# Patient Record
Sex: Female | Born: 1945
Health system: Southern US, Community
[De-identification: ages and names within clinical notes are randomized; demographics above are authoritative.]

## PROBLEM LIST (undated history)

## (undated) DIAGNOSIS — J45909 Unspecified asthma, uncomplicated: Secondary | ICD-10-CM

## (undated) DIAGNOSIS — M1990 Unspecified osteoarthritis, unspecified site: Secondary | ICD-10-CM

## (undated) DIAGNOSIS — L309 Dermatitis, unspecified: Secondary | ICD-10-CM

## (undated) DIAGNOSIS — I10 Essential (primary) hypertension: Secondary | ICD-10-CM

## (undated) DIAGNOSIS — E7800 Pure hypercholesterolemia, unspecified: Secondary | ICD-10-CM

## (undated) DIAGNOSIS — K219 Gastro-esophageal reflux disease without esophagitis: Secondary | ICD-10-CM

## (undated) HISTORY — PX: CARPAL TUNNEL RELEASE: SHX101

## (undated) HISTORY — PX: BACK SURGERY: SHX140

## (undated) HISTORY — PX: HIP SURGERY: SHX245

## (undated) HISTORY — PX: ANAL FISSURECTOMY: SUR608

## (undated) HISTORY — PX: VESICOVAGINAL FISTULA CLOSURE W/ TAH: SUR271

## (undated) HISTORY — PX: ABDOMINAL HYSTERECTOMY: SHX81

## (undated) HISTORY — DX: Gastro-esophageal reflux disease without esophagitis: K21.9

## (undated) HISTORY — DX: Dermatitis, unspecified: L30.9

## (undated) HISTORY — DX: Essential (primary) hypertension: I10

## (undated) HISTORY — DX: Pure hypercholesterolemia, unspecified: E78.00

---

## 2001-12-07 ENCOUNTER — Encounter: Payer: Self-pay | Admitting: Family Medicine

## 2001-12-07 ENCOUNTER — Ambulatory Visit (HOSPITAL_COMMUNITY): Admission: RE | Admit: 2001-12-07 | Discharge: 2001-12-07 | Payer: Self-pay | Admitting: Family Medicine

## 2002-01-17 ENCOUNTER — Ambulatory Visit (HOSPITAL_COMMUNITY): Admission: RE | Admit: 2002-01-17 | Discharge: 2002-01-17 | Payer: Self-pay | Admitting: General Surgery

## 2002-05-31 ENCOUNTER — Ambulatory Visit (HOSPITAL_COMMUNITY): Admission: RE | Admit: 2002-05-31 | Discharge: 2002-05-31 | Payer: Self-pay | Admitting: Family Medicine

## 2002-05-31 ENCOUNTER — Encounter: Payer: Self-pay | Admitting: Family Medicine

## 2002-11-05 ENCOUNTER — Ambulatory Visit (HOSPITAL_COMMUNITY): Admission: RE | Admit: 2002-11-05 | Discharge: 2002-11-05 | Payer: Self-pay | Admitting: Orthopaedic Surgery

## 2002-11-05 ENCOUNTER — Encounter: Payer: Self-pay | Admitting: Orthopaedic Surgery

## 2003-01-02 ENCOUNTER — Encounter: Payer: Self-pay | Admitting: Neurosurgery

## 2003-01-04 ENCOUNTER — Encounter: Payer: Self-pay | Admitting: Neurosurgery

## 2003-01-04 ENCOUNTER — Inpatient Hospital Stay (HOSPITAL_COMMUNITY): Admission: RE | Admit: 2003-01-04 | Discharge: 2003-01-09 | Payer: Self-pay | Admitting: Neurosurgery

## 2003-05-29 ENCOUNTER — Ambulatory Visit (HOSPITAL_COMMUNITY): Admission: RE | Admit: 2003-05-29 | Discharge: 2003-05-29 | Payer: Self-pay | Admitting: Family Medicine

## 2003-07-01 ENCOUNTER — Encounter: Payer: Self-pay | Admitting: Orthopedic Surgery

## 2003-08-13 ENCOUNTER — Ambulatory Visit (HOSPITAL_COMMUNITY): Admission: RE | Admit: 2003-08-13 | Discharge: 2003-08-13 | Payer: Self-pay | Admitting: Family Medicine

## 2003-11-08 ENCOUNTER — Encounter: Payer: Self-pay | Admitting: Orthopedic Surgery

## 2003-11-08 ENCOUNTER — Inpatient Hospital Stay (HOSPITAL_COMMUNITY): Admission: RE | Admit: 2003-11-08 | Discharge: 2003-11-12 | Payer: Self-pay | Admitting: Orthopedic Surgery

## 2003-11-12 ENCOUNTER — Inpatient Hospital Stay
Admission: RE | Admit: 2003-11-12 | Discharge: 2003-11-20 | Payer: Self-pay | Admitting: Physical Medicine & Rehabilitation

## 2003-11-13 ENCOUNTER — Encounter (HOSPITAL_COMMUNITY)
Admission: RE | Admit: 2003-11-13 | Discharge: 2003-11-20 | Payer: Self-pay | Admitting: Physical Medicine & Rehabilitation

## 2004-02-17 ENCOUNTER — Ambulatory Visit: Payer: Self-pay | Admitting: Orthopedic Surgery

## 2004-04-15 ENCOUNTER — Ambulatory Visit: Payer: Self-pay | Admitting: Orthopedic Surgery

## 2004-05-13 ENCOUNTER — Ambulatory Visit: Payer: Self-pay | Admitting: Orthopedic Surgery

## 2004-06-10 ENCOUNTER — Ambulatory Visit: Payer: Self-pay | Admitting: Orthopedic Surgery

## 2004-11-23 ENCOUNTER — Ambulatory Visit: Payer: Self-pay | Admitting: Orthopedic Surgery

## 2005-07-15 ENCOUNTER — Ambulatory Visit (HOSPITAL_COMMUNITY): Admission: RE | Admit: 2005-07-15 | Discharge: 2005-07-15 | Payer: Self-pay | Admitting: Family Medicine

## 2005-11-29 ENCOUNTER — Ambulatory Visit: Payer: Self-pay | Admitting: Orthopedic Surgery

## 2006-08-09 ENCOUNTER — Ambulatory Visit (HOSPITAL_COMMUNITY): Admission: RE | Admit: 2006-08-09 | Discharge: 2006-08-09 | Payer: Self-pay | Admitting: Family Medicine

## 2006-10-05 ENCOUNTER — Ambulatory Visit (HOSPITAL_COMMUNITY): Admission: RE | Admit: 2006-10-05 | Discharge: 2006-10-05 | Payer: Self-pay | Admitting: Family Medicine

## 2006-10-19 ENCOUNTER — Ambulatory Visit (HOSPITAL_COMMUNITY): Admission: RE | Admit: 2006-10-19 | Discharge: 2006-10-19 | Payer: Self-pay | Admitting: Family Medicine

## 2007-04-18 ENCOUNTER — Ambulatory Visit: Payer: Self-pay | Admitting: Internal Medicine

## 2007-04-18 DIAGNOSIS — R0609 Other forms of dyspnea: Secondary | ICD-10-CM | POA: Insufficient documentation

## 2007-04-18 DIAGNOSIS — J984 Other disorders of lung: Secondary | ICD-10-CM | POA: Insufficient documentation

## 2007-04-18 DIAGNOSIS — J309 Allergic rhinitis, unspecified: Secondary | ICD-10-CM | POA: Insufficient documentation

## 2007-04-18 DIAGNOSIS — F17200 Nicotine dependence, unspecified, uncomplicated: Secondary | ICD-10-CM | POA: Insufficient documentation

## 2007-04-18 DIAGNOSIS — E65 Localized adiposity: Secondary | ICD-10-CM

## 2007-04-18 DIAGNOSIS — I10 Essential (primary) hypertension: Secondary | ICD-10-CM | POA: Insufficient documentation

## 2007-04-18 DIAGNOSIS — E785 Hyperlipidemia, unspecified: Secondary | ICD-10-CM | POA: Insufficient documentation

## 2007-04-18 DIAGNOSIS — R0989 Other specified symptoms and signs involving the circulatory and respiratory systems: Secondary | ICD-10-CM

## 2007-06-26 ENCOUNTER — Emergency Department (HOSPITAL_COMMUNITY): Admission: EM | Admit: 2007-06-26 | Discharge: 2007-06-26 | Payer: Self-pay | Admitting: Emergency Medicine

## 2007-12-06 ENCOUNTER — Ambulatory Visit: Payer: Self-pay | Admitting: Orthopedic Surgery

## 2007-12-06 DIAGNOSIS — M169 Osteoarthritis of hip, unspecified: Secondary | ICD-10-CM

## 2007-12-06 DIAGNOSIS — M1610 Unilateral primary osteoarthritis, unspecified hip: Secondary | ICD-10-CM | POA: Insufficient documentation

## 2008-02-28 ENCOUNTER — Ambulatory Visit (HOSPITAL_COMMUNITY): Admission: RE | Admit: 2008-02-28 | Discharge: 2008-02-28 | Payer: Self-pay | Admitting: Family Medicine

## 2009-02-12 ENCOUNTER — Ambulatory Visit: Payer: Self-pay | Admitting: Internal Medicine

## 2009-02-12 DIAGNOSIS — R05 Cough: Secondary | ICD-10-CM

## 2009-05-20 ENCOUNTER — Ambulatory Visit (HOSPITAL_COMMUNITY): Admission: RE | Admit: 2009-05-20 | Discharge: 2009-05-20 | Payer: Self-pay | Admitting: Family Medicine

## 2009-06-17 ENCOUNTER — Ambulatory Visit: Payer: Self-pay | Admitting: Orthopedic Surgery

## 2009-06-17 DIAGNOSIS — M25569 Pain in unspecified knee: Secondary | ICD-10-CM | POA: Insufficient documentation

## 2010-05-03 ENCOUNTER — Encounter: Payer: Self-pay | Admitting: Family Medicine

## 2010-05-12 NOTE — Medication Information (Signed)
Summary: Tax adviser   Imported By: Elvera Maria 06/20/2009 09:16:56  _____________________________________________________________________  External Attachment:    Type:   Image     Comment:   history

## 2010-05-12 NOTE — Assessment & Plan Note (Signed)
Summary: LEFT KNEE PAIN NEEDS XR/[facility]   Vital Signs:  Patient profile:   65 year old female Height:      63 inches Weight:      238 pounds Pulse rate:   76 / minute Resp:     16 per minute  Vitals Entered By: Fuller Canada MD (June 17, 2009 2:25 PM)  Visit Type:  new problem Referring Provider:  Dr. Mirna Mires. Primary Provider:  Dr. Loleta Chance  CC:  left knee pain.  History of Present Illness: 65 year old female who is now 6 years status post RIGHT total hip arthroplasty with the Stryker ceramic head Trident cup Accolade stem presents now with mild dull intermittent aching pain in the LEFT knee with swelling and.  It is worse with standing or walking or if she stays off the knee.  Pain comes and goes.  She thinks it started about 6 months ago when she got in the car and the knee twisted she felt a twinge of pain in the back of the knee since that time has noted a pocket of fluid lateral to the knee pain is also relieved by Aleve and when the swelling is present she does get some stiffness   To have xrays today.  Meds: ASA, Benicar, Metoprolol,Aleve, Zocor.      Allergies: 1)  ! 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  Review of Systems Respiratory:  Complains of wheezing, couch, and snoring; denies short of breath, tightness, pain on inspiration, and snoring . Musculoskeletal:  Complains of joint pain, instability, and stiffness; denies swelling, redness, heat, and muscle pain.  The review of systems is negative for Constitutional, Cardiovascular, Gastrointestinal, Genitourinary, Neurologic, Endocrine, Psychiatric, Skin, HEENT, Immunology, and Hemoatologic.  Physical Exam  Additional Exam:  GEN: well developed, well nourished, normal grooming and hygiene, no deformity and endomorphic body habitus.   CDV: pulses are normal, no edema, no erythema. no tenderness  Lymph: normal lymph nodes   Skin: no rashes, skin lesions or open sores   NEURO: normal coordination, reflexes, sensation.   Psyche: awake, alert and oriented. Mood normal   Gait: normal  Grossly the upper extremities are normally aligned with no joint laxity subluxation atrophy tremor or contracture  The LEFT knee has no joint effusion today there is some mild tenderness along the lateral femoral condyle and lateral soft tissues.  The range of motion is normal, her strength is grade 5, there is no joint laxity  Her RIGHT lower extremity is normally aligned, her range of motion is full, strength is normal and the joint is stable     Impression & Recommendations:  Problem # 1:  KNEE PAIN (ICD-719.46) Assessment New  3 views of the LEFT knee were  ordered  The x-rays are essentially normal except for some patellofemoral spurring  Impression mild patellofemoral arthritis.  I have advised her to continue Aleve and ice as needed rest as needed follow up if symptoms become more severe but right now it seems like he just has some knee pain nonspecific  Orders: Est. Patient Level  IV (04540) Knee x-ray,  3 views (98119)  Patient Instructions: 1)  Please schedule a follow-up appointment as needed.

## 2010-08-28 NOTE — Op Note (Signed)
NAME:  Jennifer Bennett, Jennifer Bennett                ACCOUNT NO.:  [id number]   MEDICAL RECORD NO.:  [id number]                   PATIENT TYPE:  INP   LOCATION:  A338                                 FACILITY:  [facility]   PHYSICIAN:  Vickki Hearing, M.D.           DATE OF BIRTH:  November 02, 1945   DATE OF PROCEDURE:  11/08/2003  DATE OF DISCHARGE:                                 OPERATIVE REPORT   PREOPERATIVE DIAGNOSIS:  Osteoarthritis, right hip.   POSTOPERATIVE DIAGNOSIS:  Osteoarthritis, right hip.   OPERATION/PROCEDURE:  Right total hip arthroplasty.   HISTORY:  The patient is a 65 year old female, status post lumbar fusion,  who continued to complain of right leg pain with groin pain and pain in the  right thigh radiating to the knee after nonoperative treatment failed to  alleviate her symptoms.  Additional radiographs were obtained which showed a  right hip arthritis and she presented for surgery.   SURGEON:  Vickki Hearing, M.D.   ANESTHESIA:  Spinal.   ESTIMATED BLOOD LOSS:  750 mL.   IMPLANTS USED:  Size 2 Accolade stem, Stryker system.  32 ceramic head, 0  neck, 52E Trident PSL cup, 0-degree 32 mm polyethylene liner with two  cancellous screws.   DESCRIPTION OF PROCEDURE:  Mrs. Watkins-Schnabel was identified in the  preoperative holding area.  Her chart was reviewed.  A mark was placed over  her right hip and I signed my initials over the mark.  She was given  preoperative Ancef and taken to the operating room room where spinal was  then placed in the lateral decubitus position, right side up.   An incision was made over the greater trochanter, extended proximally and  distally.  The subcutaneous tissue, which was thick, was divided and  cauterized.  The fascial layer was split in line with the skin incision.  This exposed the abductors and the abductors were removed from the greater  trochanter subperiosteal fascia.  This was extended into the vastus  lateralis  musculature and peeled anteriorly as one continuous flap.  The  capsule was excised and the hip was dislocated anteriorly.  A long femoral  neck cut was made in preparation to use the ceramic head if possible.  The  inner portion of the greater trochanter was removed with the rongeur and a  __________ was passed into the femoral canal followed by broaches up to a  size 2 which had an excellent fit.  The leg was taken out of the sterile bag  and extended and acetabular exposure was obtained.  This was extremely  difficult.  The head had migrated proximally creating soft tissue and the  inferior portion of the acetabulum, which had to be removed, obscured the  acetabulum, and a long dissection was necessary to remove the soft tissue  and expose the anterior and posterior columns.   Once this was accomplished, a 44 mm reamer was used to bottom out the  acetabulum and serial reamings  in a closed anteverted position were done up  to a size 52 at which time a 52 trial was placed and seated very well.  The  floor of the acetabulum needed to be bone grafted and this was done with  femoral head and reamings.   The cup was placed and had a good press-fit.  Two additional screws were  added for additional fixation.  The trial liner was placed and a neck length  was chosen and a 32 mm head was chosen and the hip was taken through a range  of motion after reduction.  We achieved 5 degrees of hyperextension and 50  degrees of external rotation but no dislocation or impingement.  We also  achieved 100 degrees of flexion, 45 degrees of internal rotation with no  impingement and we also adducted the hip 10 degrees at the 90-degree flex  position and internally rotated 50 degrees with no dislocation.   This was deemed satisfactory stability.  The hip was dislocated.  The trial  and implants were removed.  The liner was placed followed by the true  implantation of the femoral stem within the ceramic head.   The head was  reduced gently. The wound was irrigated.  The hip was taken through a range  of motion and found to be stable.  We put in a Hemovac drain and then closed  the abductor vastus lateralis mass back to its anatomic position using #5  Ticron suture.  We then closed the fascia with #1 running Bralon using three  sutures. We closed the subcutaneous tissue with 0 and 2-0 Vicryl.  We closed  the skin.  We inserted a pain pump catheter and dressed the wound sterilely,  and then took the patient back to her regular bed with an abduction pillow,  and then to the recovery room in stable condition.   Postoperative x-ray was taken showing that the cup position was good.  Stem  position was also good and the hip was reduced.   Postoperative plan is normal total hip protocol.      ___________________________________________                                            Vickki Hearing, M.D.   SEH/MEDQ  D:  11/08/2003  T:  11/09/2003  Job:  [id number]

## 2010-08-28 NOTE — Consult Note (Signed)
NAME:  Jennifer Bennett, Jennifer Bennett                ACCOUNT NO.:  [id number]   MEDICAL RECORD NO.:  [id number]                   PATIENT TYPE:  INP   LOCATION:  A338                                 FACILITY:  [facility]   PHYSICIAN:  Annia Friendly. Loleta Chance, M.D.                DATE OF BIRTH:  10-16-1945   DATE OF CONSULTATION:  DATE OF DISCHARGE:                                   CONSULTATION   HISTORY:  The patient was admitted by orthopedics, Dr. Fuller Canada, for  elective total right hip replacement on November 08, 2003.  Her history is  positive for increased right groin and right hip pain over 1 year.  The  patient has found it difficult to perform function at work due to pain.  The  patient is the head teller department at a bank.  Her work environment  required her to be on her feet constantly.  The patient is using a cane and  taking analgesics for relief at the time of admission.  The patient feels  that her quality of life has deteriorated a lot.  An x-ray of her hip shows  significant degenerative joint disease and proximal migration of the femoral  head.   PAST MEDICAL HISTORY:  1. Hypertension.  2. Hyperlipidemia.  3. Surgical menopause.  4. Negative for diabetes, tuberculosis, cancer, sickle cell, asthma, and     seizure disorder.   HABITS:  Positive for cigarette smoking greater than 18 months.  Positive  for social use of ethanol.  The patient denied the use of street drugs.   ALLERGIES:  1. ACE INHIBITORS.  2. OXYCODONE.   PRESCRIBED MEDICATIONS:           1. ________ 250/25 po q day.           2. Lopid 600 mg po q day after supper.           3. Metoprolol 25 mg 1 tab twice a day.           4. Vicodin ES  _____po q 4 - 6 hours as needed for pain.           5.  PAST HOSPITALIZATIONS:  1. Anal dilatation with anal fissurectomy and internal sphincterotomy in     January 1993 at [facility].  2. Total abdominal hysterectomy with bilateral salpingo-oophorectomy and  appendectomy in December 1993.  3. Return of right lower abdominal pain secondary to degenerating fibroid in     December 1993.  4. Lower back surgery secondary to disk disease in September 2004 at [facility].   FAMILY HISTORY:  Mother is living at age 52 with a history of hypertension  and osteoarthritis; father deceased at age 81 secondary to complications of  pancreatic cancer.  Three brothers are living at the ages of 4 with a  history of hypertension, age 59 with a history of hypertension, and age 42  with good  health.  Two sisters are living at the ages of 85 with a history  of hypertension and age 34 in good health.   REVIEW OF SYSTEMS:  Positive for episodic constipation, multiple joint  stiffness and pain (knees and shoulder intermittently).  Also positive for  periods of melancholy, crying spells, and a feeling of despair because of  her multiple medical problems.  Negative for suicidal ideation, epistaxis,  chronic cough, hemoptysis, wheezing, shortness of breath, chest pain,  syncope, dizziness, nausea and vomiting, dysuria, diarrhea, edema of the  legs, weight loss, lumps in breasts, discharge from nipples, dysphasia, etc.   PHYSICAL EXAMINATION:  VITAL SIGNS:  Temperature 99.5, pulse 84,  respirations 18, blood pressure 132/75.  GENERAL:  Revealed a middle-aged overweight, slightly short, alert, black  female in apparent respiratory distress.  HEENT:  Normocephalic.  Ears normal auricle and external canal patent.  Eyes, lids negative ptosis.  Sclerae white.  Pupils round and equally  reactive to light.  Extraocular movements intact.  Negative discharge from  mouth.  Positive for missing teeth, remaining dentition good.  No bleeding  gums.  Posterior pharynx benign.  NECK:  Negative for lymphadenopathy or thyromegaly.  LUNGS:  Clear.  HEART:  Audible S1 S2 rub or murmur.  Regular rate and rhythm.  BREASTS:  Large.  No skin changes.  No nodule on palpation.   Nipple erect  but without discharge.  ABDOMEN:  Obese.  Hypoactive bowel sounds.  Positive for healed old surgical  scar.  Soft, nontender in all 4 quadrants.  No palpable masses, no  organomegaly.  GENITALIA:  External genitalia normal female.  RECTAL:  Deferred.  EXTREMITIES:  Right hip surgical dressing intact.  No discoloration.  Knees  positive for stiffness on flexion and extension.  No swelling of the knees.  No tibial edema.  Palpable dorsalis pedis bilaterally.  NEUROLOGIC:  Alert and oriented to person, place, and time.  Cranial nerves  II-XII appeared intact.   LABORATORY DATA:  On November 06, 2003 white count 4.6, hemoglobin 13.6,  hematocrit 39.5, platelets 310,000.  Prothrombin time 12.5, INR 0.9, partial  thromboplastin time 32.  Sodium 139, potassium 3.9, chloride 103, CO2 of 29,  glucose 93, BUN 11, creatinine 0.7, calcium 9.5.  Labs on November 09, 2003  hemoglobin 9.2, hematocrit 26.9, platelets 269,000.  Sodium 132, potassium  4.5, chloride 98, CO2 is 27, glucose 124, BUN 16, creatinine 1.0, calcium  8.6.   PLAN:  CBC.  Decrease metoprolol to 12.5 mg p.o. b.i.d.  Change  hydrochlorothiazide to 12.5 mg p.o. everyday.  Watch blood pressure.  Repeat  CBC, SMA-7.  Do a urinalysis.  Continue postoperative plan under orthopedic  guidance.  Aldoril 25 one tablet p.o. everyday.  TED stockings.  Lopid 600  mg p.o. daily.  Analgesic for pain IV ringers lactate at 100 cc/hour.  Adjust under the supervision of the orthopedist.  Diet will be 4 g sodium,  low cholesterol   PRIMARY DIAGNOSIS:  Status post _____total right hip arthrosplasty.   SECONDARY DIAGNOSES:                 1. Hypertension                 2. Osteoarthritis                 3. Hyperlipidemia                 4. Post-op anemia.  ___________________________________________                                            Annia Friendly. Loleta Chance, M.D.  Levonne Hubert  D:  11/11/2003  T:  11/11/2003  Job:  [id number]

## 2010-08-28 NOTE — Group Therapy Note (Signed)
NAME:  Jennifer Bennett, Jennifer Bennett NO.:  [id number]   MEDICAL RECORD NO.:  [id number]                  PATIENT TYPE:   LOCATION:                                       FACILITY:   PHYSICIAN:  Vickki Hearing, M.D.           DATE OF BIRTH:  1946/03/18   DATE OF PROCEDURE:  DATE OF DISCHARGE:                                   PROGRESS NOTE   Postoperative day one right total hip replacement with bone graft.  The  patient has a low grade temperature.  Has a hemoglobin of 9, started with  11.1.  Pain control is an issue today.  She is status post lumbar fusion and  was maintained on Vicodin ES or Lorcet Plus and I believe this to be the  reason for the pain management problem at this time.  This should be easily  managed with a PCA pump, however.  She should be out of bed to chair today.  Will start her on iron.  I will change her weightbearing status to partial.  Secondary review of the postoperative x-ray shows a hint of a hairline  fracture of the acetabulum.  The cup is stable as it has been stabilized  with screws.  There is bone graft in the defect in the floor of the  acetabulum and partial weightbearing status will serve her better.      ___________________________________________                                            Vickki Hearing, M.D.   SEH/MEDQ  D:  11/09/2003  T:  11/09/2003  Job:  [id number]

## 2010-08-28 NOTE — H&P (Signed)
   NAME:  Jennifer Bennett, Jennifer Bennett                        ACCOUNT NO.:  [id number]   MEDICAL RECORD NO.:  [id number]                   PATIENT TYPE:  AMB   LOCATION:  DAY                                  FACILITY:  [facility]   PHYSICIAN:  Jerolyn Shin C. Katrinka Blazing, M.D.                DATE OF BIRTH:  [birth year]   DATE OF ADMISSION:  DATE OF DISCHARGE:                                HISTORY & PHYSICAL   HISTORY OF PRESENT ILLNESS:  A 65 year old female referred for screening  colonoscopy.  She has no abdominal complaints, no history of rectal  bleeding, no evidence of anemia.  There is no family history of colon cancer  or polypoid disease.   PAST HISTORY:  She has hypertension, osteoarthritis and obesity.   PAST SURGICAL HISTORY:  Hysterectomy, appendectomy, anal fissurectomy and  internal sphincterotomy.   SOCIAL HISTORY:  She is a widow.  She smokes one pack of cigarettes per day,  admits to occasional alcohol.   MEDICATIONS:  1. Metoprolol 50 mg every day.  2. K-Dur 20 mEq every day.  3. Lopid 600 mg every day.  4. Evista 60 mg every day.  5. Aspirin one every day.  6. Calcium 1500 mg every day.  7. Aldoril 25 mg one every day.   ALLERGIES:  She has no known drug allergies.   PHYSICAL EXAMINATION:  VITAL SIGNS:  Blood pressure 140/82, pulse 68 and  respirations 18.  Weight 213 pounds.  HEENT:  Unremarkable.  NECK:  Neck is supple.  No JVD or bruits.  CHEST:  Chest clear to auscultation.  No rales, rubs, rhonchi or wheezes.  HEART:  Regular rate and rhythm without murmur, gallop or rub.  ABDOMEN:  Abdomen soft, nontender.  _________.  EXTREMITIES:  No cyanosis, clubbing or edema.  NEUROLOGIC:  No focal motor, sensory or cerebellar deficit.   IMPRESSION:  1. Need for screening colonoscopy.  2.     Hypertension.  3. Osteoarthritis.   PLAN:  Screening colonoscopy.                                               Dirk Dress. Katrinka Blazing, M.D.    LCS/MEDQ  D:  01/16/2002  T:  01/17/2002  Job:   [id number]

## 2010-08-28 NOTE — Discharge Summary (Signed)
NAME:  Jennifer Bennett, Jennifer Bennett                ACCOUNT NO.:  [id number]   MEDICAL RECORD NO.:  [id number]                   PATIENT TYPE:  ORB   LOCATION:  4526                                 FACILITY:  [facility]   PHYSICIAN:  Ranelle Oyster, M.D.             DATE OF BIRTH:  December 26, 1945   DATE OF ADMISSION:  11/12/2003  DATE OF DISCHARGE:  11/20/2003                                 DISCHARGE SUMMARY   DISCHARGE DIAGNOSES:  1. Right total hip replacement.  2. History of hypertension.  3. Anemia.  4. History of hyperlipidemia.   HISTORY OF PRESENT ILLNESS:  The patient is 65 year old, African-American  female with past medical history of lumbar spine in 2004, recent carpal  tunnel release and right hip pain which failed conservative care and elected  to undergo a right total hip replacement on November 08, 2003, by Dr. Vickki Hearing.  The patient is presently on no DVT prophylaxis postoperatively.  Hospital course was significant for anemia, status post transfusion and  hypotension.  PT report at this time indicates the patient is ambulating 15  feet with minimal to moderate assist for transfers.  The patient is partial  weightbearing at 50%.  The patient was transferred to [facility] Subacute  Department on November 12, 2003.   REVIEW OF SYMPTOMS:  Significant for joint swelling with wound care,  weakness and numbness.   PAST MEDICAL HISTORY:  1. Back pain with stenosis.  2. Hypertension.  3. Carpal tunnel syndrome.  4. Hyperlipidemia.   PAST SURGICAL HISTORY:  1. Hysterectomy.  2. Appendectomy.  3. Lumbar spine surgery in 2004.  4. Carpal tunnel release.   FAMILY HISTORY:  Noncontributory.   SOCIAL HISTORY:  The patient is a widow and lives in a one-level home with  four steps to entry.  She has no children.  She lives in [place].  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  She quit tobacco in February 2004.  Occasional alcohol use.  One step-son.   MEDICATIONS PRIOR TO ADMISSION:  Metoprolol, Vicodin, Lopid.   ALLERGIES:  ACE INHIBITORS.   HOSPITAL COURSE:  Ms. Mossbarger was admitted to [facility] Subacute  Department on August 2, where she received many hours of therapy.  Overall,  the patient progressed very well once the pain was under reasonable control.  She received Vicodin as needed.  The patient was placed on Coumadin for DVT  prophylaxis.  She was started on Lovenox 30 mg q.12h. on November 12, 2003,  until Coumadin was therapeutic.  Due to no DVT prophylaxis prior to  admission to subacute, we did perform Dopplers on November 12, 2003, on the  patient which demonstrated no evidence of DVT, superficial thrombus or  Baker's cyst.  The patient with no complications noted.  Overall, she was  discharged at modified independent level able to maintain her weightbearing  status and ambulating for greater than 100 feet.  She did tolerate  therapies  very well.  Hospital course while in subacute was significant for anemia,  muscle spasms and insomnia.  The patient was placed on Ambien p.o. q.h.s as  needed for sleep.  She was continued on Trinsicon one tablet p.o. b.i.d. for  anemia.  She was scheduled on Skelaxin 800 mg.  The patient remained on  Aldomet as well as HCTZ and Lopressor for hypertension.  No adjustments in  these medications was needed.  She continued to take Lopid 600 mg a day for  history of hyperlipidemia.  The patient did receive potassium supplement  while on subacute.  This was discontinued at the time of discharge.  Latest  potassium was 4.2 and the patient was not on potassium supplement prior to  admission.  There were no other major issues that occurred while the patient  was in rehabilitation.  She did have a slight elevated fever on November 13, 2003.  She was started on antibiotics empirically.  Her fever workup was  negative and antibiotics were discontinued.   PT report at the time of discharge indicated  the patient was unable to  ambulate 25 feet with standard walker modified independently.  Transfers sit  to stand modified independently.  Bed mobility modified independently.  ADLs  performed were modified independently at the time of discharge.   At the time of discharge, all vitals are stable.  There was slight serous  drainage from the posterior of the incision.  Staples still remained.  She  follow up with Dr. Romeo Apple next week for staples to be removed.  The rest  of the physical exam is unremarkable.  The patient was discharged home with  her family.   LABORATORY DATA AND X-RAY FINDINGS:  Latest labs revealed PT/INR 2.3 on  August 10.  Latest hemoglobin 10.4, hematocrit 31.0, platelet count 440,  white blood count 11.1.  Two Hemoccults were performed and were negative.  Latest sodium 135, potassium 4.2, chloride 101, CO2 26, glucose 103, BUN 11,  creatinine 0.7.  AST 28, ALT 20, Alk phos 62.  Urine culture from November 13, 2003, with 75,000 colonies with no uropathogen isolated.   DISCHARGE MEDICATIONS:  1. Coumadin 5 mg daily until December 09, 2003, take in p.m.  2. Trinsicon one tablet twice daily.  3. Skelaxin 800 mg one tablet three times daily if needed.  4. Resume Aldomet and HCTZ.  5. Resume Lopid 600 mg twice daily.  6. Metoprolol 25 mg once daily.  7. Vicodin one to two tablets every four to six hours as needed.  8. No aspirin, Aleve or ibuprofen while on Coumadin.  9. Vicodin for pain management.   ACTIVITY:  No driving.  Use walker.  She is to maintain partial  weightbearing at 50% and observe hip precautions.   WOUND CARE:  Staples to be removed at end of this week or early next week  from Dr. Mort Sawyers office.   SPECIAL INSTRUCTIONS:  [facility] for PT/OT and R.N. to monitor  Coumadin.  First draw will be Monday, November 25, 2003.   FOLLOW UP:  Follow up with Dr. Ranelle Oyster as needed.  Follow up with Vickki Hearing next week for  appointment.  Follow up with Dr. Mirna Mires in four to six weeks to check hemoglobin.      Loyce Brown, P.A.  Ranelle Oyster, M.D.    LB/MEDQ  D:  11/20/2003  T:  11/21/2003  Job:  [id number]   cc:   Vickki Hearing, M.D.  Fax: [phone]   Annia Friendly. Loleta Chance, M.D.  [address]  [place]  [place] [address]  Fax: [phone]

## 2010-08-28 NOTE — Op Note (Signed)
NAME:  Jennifer Bennett, Jennifer Bennett                ACCOUNT NO.:  [id number]   MEDICAL RECORD NO.:  [id number]                   PATIENT TYPE:  INP   LOCATION:  5034                                 FACILITY:  [facility]   PHYSICIAN:  Coletta Memos, M.D.                  DATE OF BIRTH:  07-Dec-1945   DATE OF PROCEDURE:  01/04/2003  DATE OF DISCHARGE:                                 OPERATIVE REPORT   PREOPERATIVE DIAGNOSES:  1. Spondylolisthesis, L4-5, acquired.  2. Degenerative disk disease, L4-5.  3. Displaced disk, L4-5 right.  4. Lumbar radiculopathy.   POSTOPERATIVE DIAGNOSES:  1. Spondylolisthesis, L4-5, acquired.  2. Degenerative disk disease, L4-5.  3. Displaced disk, L4-5 right.  4. Lumbar radiculopathy.   PROCEDURES:  1. Posterolateral arthrodesis, L4-5, with pedicle screw fixation, four 40 x     6.5 mm Medtronic screws.  2. Posterior lumbar interbody fusion, L4-5, with Peek 11 mm cage.  3. Gill procedure, L4.  4. Morcellized allograft and autograft for L4-5 interbody arthrodesis.   COMPLICATIONS:  None.   SURGEON:  Coletta Memos, M.D.   ASSISTANT:  Stefani Dama, M.D.   INDICATIONS:  Jennifer Bennett has a spondylolisthesis and a herniated  disk on the right side along with significant facet arthropathy at L4-5.  As  a result and her right lower extremity pain, I recommended and she agreed to  undergo a lumbar fusion at L4-5 via posterior lumbar interbody technique and  posterolateral technique.   OPERATIVE NOTE:  Jennifer Bennett was brought to the operating room.  The patient was intubated and rolled prone onto rolls.  All pressure points  were properly padded.  She had extremely large breasts, and we did our  best  to make sure that there was no direct pressure on the breasts and nipples.   Her back was prepped and she was draped in a sterile fashion.  A skin  incision was made with a #10 blade, and I took this down to the  thoracolumbar fascia and exposed  the lamina there.  It turned out that that  was actually the L3 lamina, so I then extended my incision both rostrally  and caudally until I exposed the L3, L4, L5 laminae bilaterally.  I took  another x-ray to confirm that I was actually at L4-5, and I was.  I then  proceeded with a semihemilaminectomy on the right side using a high-speed  air drill and Kerrison punches at L4-5.  I was able to then perform the Gill  procedure of L4 without great difficulty.  I retracted the nerve root  medially on the right side and then proceeded with diskectomy.  I removed a  large amount of disk material using a combination of curettes, pituitary  rongeurs, and Kerrison punches.  After doing that and satisfying myself that  I had enough room to place an 11 mm T-LIF Peek cage, I then turned my  attention to placing  pedicle screws.   For the posterolateral arthrodesis and pedicle screw fixation, I then with  fluoroscopic guidance placed four screws with Dr. Verlee Rossetti assistance, two  at L4, two at L5.  First the transverse process was identified at each step,  tap hole was made, and then using a pedicle probe the pedicle was then  localized.  I then was able to tap and then place four screws, four 6.5 x 40  mm screws.  When I was done I placed the rod on the left side and distracted  the disk space and then placed an 11 mm cage in a transverse position on the  right side without difficulty.  I then completed the Gill procedure on the  right side with Kerrison punch and a drill.  I then packed bone into the  interspace at L4-5 with Dr. Verlee Rossetti assistance.  We then placed the rods  and screw caps on each one.  I then tightened the screws into position.  I  then irrigated the wound.  I then closed the wound in a layered fashion  using Vicryl sutures.  The thoracolumbar fascia, subcutaneous tissue, and  subcuticular layers were all reapproximated with Vicryl sutures.  Dermabond  was used for a sterile  dressing.  The patient was extubated and was moving  all extremities.                                               Coletta Memos, M.D.    KC/MEDQ  D:  01/04/2003  T:  01/07/2003  Job:  [id number]

## 2010-08-28 NOTE — Discharge Summary (Signed)
   NAME:  Jennifer Bennett, Jennifer Bennett                ACCOUNT NO.:  [id number]   MEDICAL RECORD NO.:  [id number]                   PATIENT TYPE:  INP   LOCATION:  5034                                 FACILITY:  [facility]   PHYSICIAN:  Coletta Memos, M.D.                  DATE OF BIRTH:  09/21/1945   DATE OF ADMISSION:  01/04/2003  DATE OF DISCHARGE:  01/09/2003                                 DISCHARGE SUMMARY   ADMISSION DIAGNOSIS:  L4-5 spondylolisthesis, L4-5 spondylosis, lumbar  radiculopathy.   DISCHARGE DIAGNOSIS:  L4-5 spondylolisthesis, L4-5 spondylosis, lumbar  radiculopathy.   PROCEDURES:  L4-5 posterior lumbar interbody fusion, L4-5 posterolateral  arthrodesis with pedical screw fixation.   INDICATIONS:  Ms. Dickard was admitted to the hospital for pain in  the lower back secondary to spondylolisthesis at L4-5 caused by degenerative  disk and what was a large herniated disk on the right side and facet  arthropathy.  She had right lower extremity pain and I therefore recommended  and she agreed to undergo lumbar fusion at L4-5.  Ms. Vest was  brought to the operating room and underwent an uncomplicated procedure.   HOSPITAL COURSE:  Postoperatively she did quite well while in the hospital.  She was able to void at the time of discharge.  The wound was clean and dry  without signs of infection.  She was walking well.  She had social worker  come and provide equipment for her home.  Overall she did quite well.  She  was discharged home with a specific instruction sheet given, and told not to  drive for the next 10 days.  She was given pain pills.                                                Coletta Memos, M.D.    KC/MEDQ  D:  01/25/2003  T:  01/26/2003  Job:  [id number]

## 2010-08-28 NOTE — Discharge Summary (Signed)
NAME:  Jennifer Bennett, Jennifer Bennett                ACCOUNT NO.:  [id number]   MEDICAL RECORD NO.:  [id number]                   PATIENT TYPE:  INP   LOCATION:  A338                                 FACILITY:  [facility]   PHYSICIAN:  Vickki Hearing, M.D.           DATE OF BIRTH:  [birth year]   DATE OF ADMISSION:  11/08/2003  DATE OF DISCHARGE:  11/12/2003                                 DISCHARGE SUMMARY   ADMITTING DIAGNOSIS:  Osteoarthritis, right hip.   DISCHARGE DIAGNOSIS:  Osteoarthritis, right hip.   SECONDARY DIAGNOSIS:  Postoperative anemia.   PROCEDURES PERFORMED:  The patient underwent a Press-Fit total hip, right  lower extremity, on November 08, 2003.  She tolerated that well.   Postoperative course was marked by anemia with a hemoglobin of 7.5.  She was  transfused two units.  Hemoglobin on the day of discharge was 10.  She did  well in terms of pain control, using a PCA, oral analgesics and a pain pump  catheter.   She ambulated well with physical therapy up to 12 feet partial  weightbearing.   DISCHARGE MEDICATIONS:  1. Vicodin 1.5 tablets every four hours for pain.  2. Skelaxin 800 mg t.i.d. as needed.  3. Potassium 20 mEq b.i.d.  4. Aldomet 250 mg daily.  5. Lopid 600 mg b.i.d. a.c.  6. Iron 325 mg b.i.d. w.c.  7. Lopressor 12.5 mg daily.  8. Hydrochlorothiazide 12.5 mg daily.  9. Milk of Magnesia 30 cc p.o. until bowel movement.  10.      Colace 200 mg p.o. q.h.s.   WEIGHTBEARING STATUS:  Partial weightbearing for six weeks due to possible  nondisplaced acetabular fracture.   Anterior precautions, abduction pillow six weeks, continue Lovenox 40 mg  subcu every day for 24 days.   Follow-up with Dr. Romeo Apple one week after discharge from rehabilitation.  Staples should come out postoperative day No. 12, which will be August 8th.  Steri-Strips can be applied to the wound as needed.     ___________________________________________         Vickki Hearing, M.D.   SEH/MEDQ  D:  11/12/2003  T:  11/12/2003  Job:  [id number]

## 2011-05-11 ENCOUNTER — Other Ambulatory Visit (HOSPITAL_COMMUNITY): Payer: Self-pay | Admitting: Family Medicine

## 2011-05-11 DIAGNOSIS — Z139 Encounter for screening, unspecified: Secondary | ICD-10-CM

## 2011-05-18 ENCOUNTER — Ambulatory Visit (HOSPITAL_COMMUNITY)
Admission: RE | Admit: 2011-05-18 | Discharge: 2011-05-18 | Disposition: A | Discharge status: Home / Self Care | Payer: Medicare Other | Source: Ambulatory Visit | Attending: Family Medicine | Admitting: Family Medicine

## 2011-05-18 DIAGNOSIS — Z1231 Encounter for screening mammogram for malignant neoplasm of breast: Secondary | ICD-10-CM | POA: Insufficient documentation

## 2011-05-18 DIAGNOSIS — Z139 Encounter for screening, unspecified: Secondary | ICD-10-CM

## 2011-05-24 ENCOUNTER — Other Ambulatory Visit: Payer: Self-pay | Admitting: Family Medicine

## 2011-05-24 DIAGNOSIS — R928 Other abnormal and inconclusive findings on diagnostic imaging of breast: Secondary | ICD-10-CM

## 2011-06-08 ENCOUNTER — Other Ambulatory Visit: Payer: Self-pay | Admitting: Family Medicine

## 2011-06-08 ENCOUNTER — Ambulatory Visit
Admission: RE | Admit: 2011-06-08 | Discharge: 2011-06-08 | Disposition: A | Discharge status: Home / Self Care | Payer: PRIVATE HEALTH INSURANCE | Source: Ambulatory Visit | Attending: Family Medicine | Admitting: Family Medicine

## 2011-06-08 ENCOUNTER — Ambulatory Visit
Admission: RE | Admit: 2011-06-08 | Discharge: 2011-06-08 | Disposition: A | Discharge status: Home / Self Care | Payer: Medicare Other | Source: Ambulatory Visit | Attending: Family Medicine | Admitting: Family Medicine

## 2011-06-08 DIAGNOSIS — R928 Other abnormal and inconclusive findings on diagnostic imaging of breast: Secondary | ICD-10-CM

## 2011-06-08 DIAGNOSIS — R921 Mammographic calcification found on diagnostic imaging of breast: Secondary | ICD-10-CM

## 2011-06-08 DIAGNOSIS — N6489 Other specified disorders of breast: Secondary | ICD-10-CM | POA: Diagnosis not present

## 2011-06-08 DIAGNOSIS — D249 Benign neoplasm of unspecified breast: Secondary | ICD-10-CM | POA: Diagnosis not present

## 2011-06-14 DIAGNOSIS — I10 Essential (primary) hypertension: Secondary | ICD-10-CM | POA: Diagnosis not present

## 2011-06-24 ENCOUNTER — Other Ambulatory Visit (HOSPITAL_COMMUNITY): Payer: Self-pay | Admitting: Family Medicine

## 2011-06-24 DIAGNOSIS — Z780 Asymptomatic menopausal state: Secondary | ICD-10-CM

## 2011-06-29 DIAGNOSIS — L259 Unspecified contact dermatitis, unspecified cause: Secondary | ICD-10-CM | POA: Diagnosis not present

## 2011-07-06 ENCOUNTER — Other Ambulatory Visit (HOSPITAL_COMMUNITY): Payer: Medicare Other

## 2011-07-27 ENCOUNTER — Ambulatory Visit (HOSPITAL_COMMUNITY)
Admission: RE | Admit: 2011-07-27 | Discharge: 2011-07-27 | Disposition: A | Discharge status: Home / Self Care | Payer: Medicare Other | Source: Ambulatory Visit | Attending: Family Medicine | Admitting: Family Medicine

## 2011-07-27 DIAGNOSIS — Z780 Asymptomatic menopausal state: Secondary | ICD-10-CM | POA: Diagnosis not present

## 2011-07-27 DIAGNOSIS — Z9079 Acquired absence of other genital organ(s): Secondary | ICD-10-CM | POA: Diagnosis not present

## 2011-07-27 DIAGNOSIS — F17200 Nicotine dependence, unspecified, uncomplicated: Secondary | ICD-10-CM | POA: Diagnosis not present

## 2011-07-27 DIAGNOSIS — M899 Disorder of bone, unspecified: Secondary | ICD-10-CM | POA: Insufficient documentation

## 2011-07-27 DIAGNOSIS — M949 Disorder of cartilage, unspecified: Secondary | ICD-10-CM | POA: Diagnosis not present

## 2011-08-31 DIAGNOSIS — M109 Gout, unspecified: Secondary | ICD-10-CM | POA: Diagnosis not present

## 2011-09-07 DIAGNOSIS — M109 Gout, unspecified: Secondary | ICD-10-CM | POA: Diagnosis not present

## 2011-09-22 DIAGNOSIS — J309 Allergic rhinitis, unspecified: Secondary | ICD-10-CM | POA: Diagnosis not present

## 2011-09-22 DIAGNOSIS — J209 Acute bronchitis, unspecified: Secondary | ICD-10-CM | POA: Diagnosis not present

## 2011-10-19 DIAGNOSIS — J209 Acute bronchitis, unspecified: Secondary | ICD-10-CM | POA: Diagnosis not present

## 2011-10-19 DIAGNOSIS — J309 Allergic rhinitis, unspecified: Secondary | ICD-10-CM | POA: Diagnosis not present

## 2011-10-26 DIAGNOSIS — J309 Allergic rhinitis, unspecified: Secondary | ICD-10-CM | POA: Diagnosis not present

## 2011-12-07 DIAGNOSIS — J029 Acute pharyngitis, unspecified: Secondary | ICD-10-CM | POA: Diagnosis not present

## 2011-12-07 DIAGNOSIS — I10 Essential (primary) hypertension: Secondary | ICD-10-CM | POA: Diagnosis not present

## 2011-12-21 DIAGNOSIS — R490 Dysphonia: Secondary | ICD-10-CM | POA: Diagnosis not present

## 2011-12-21 DIAGNOSIS — J387 Other diseases of larynx: Secondary | ICD-10-CM | POA: Diagnosis not present

## 2012-02-29 DIAGNOSIS — J387 Other diseases of larynx: Secondary | ICD-10-CM | POA: Diagnosis not present

## 2012-02-29 DIAGNOSIS — R490 Dysphonia: Secondary | ICD-10-CM | POA: Diagnosis not present

## 2012-04-25 DIAGNOSIS — E782 Mixed hyperlipidemia: Secondary | ICD-10-CM | POA: Diagnosis not present

## 2012-04-25 DIAGNOSIS — E7800 Pure hypercholesterolemia, unspecified: Secondary | ICD-10-CM | POA: Diagnosis not present

## 2012-04-25 DIAGNOSIS — I10 Essential (primary) hypertension: Secondary | ICD-10-CM | POA: Diagnosis not present

## 2012-04-25 DIAGNOSIS — Z23 Encounter for immunization: Secondary | ICD-10-CM | POA: Diagnosis not present

## 2012-04-25 DIAGNOSIS — M1990 Unspecified osteoarthritis, unspecified site: Secondary | ICD-10-CM | POA: Diagnosis not present

## 2012-05-16 ENCOUNTER — Ambulatory Visit (INDEPENDENT_AMBULATORY_CARE_PROVIDER_SITE_OTHER): Payer: Medicare Other | Admitting: Orthopedic Surgery

## 2012-05-16 ENCOUNTER — Ambulatory Visit (INDEPENDENT_AMBULATORY_CARE_PROVIDER_SITE_OTHER): Payer: Medicare Other

## 2012-05-16 ENCOUNTER — Encounter: Payer: Self-pay | Admitting: Orthopedic Surgery

## 2012-05-16 VITALS — BP 150/78 | Ht 61.5 in | Wt 238.0 lb

## 2012-05-16 DIAGNOSIS — M1710 Unilateral primary osteoarthritis, unspecified knee: Secondary | ICD-10-CM | POA: Insufficient documentation

## 2012-05-16 DIAGNOSIS — M25469 Effusion, unspecified knee: Secondary | ICD-10-CM

## 2012-05-16 DIAGNOSIS — M179 Osteoarthritis of knee, unspecified: Secondary | ICD-10-CM | POA: Insufficient documentation

## 2012-05-16 DIAGNOSIS — M25569 Pain in unspecified knee: Secondary | ICD-10-CM | POA: Insufficient documentation

## 2012-05-16 NOTE — Progress Notes (Signed)
Patient ID: Jennifer Bennett, female   DOB: 1945/09/22, 66 y.o.   MRN: [id number] Chief Complaint  Patient presents with  . Knee Pain    Right knee pain, no injury.      67 year old female presents with atraumatic onset of gradually increasing he had a 10 activity related medial knee pain with no catching locking or giving way. She does report swelling and increased pain when she is weightbearing.  Review of systems a 14 system review was performed with the patient answering questions she complains of ordering of her eyes, snoring, acid reflux, mild depression with seasonal allergies and drug related symptoms as described otherwise normal  Past family social history as recordedand has been reviewed  Physical exam BP 150/78  Ht 5' 1.5" (1.562 m)  Wt 238 lb (107.956 kg)  BMI 44.24 kg/m2  Physical Exam(12)  Vital signs:   GENERAL: normal development   CDV: pulses are normal   Skin: normal  Lymph: nodes were not palpable/normal  Psychiatric: awake, alert and oriented  Neuro: normal sensation  She has required ambulation with a cane she is status post right total hip arthroplasty and she is functioning well from that  She does notice that her leg is starting to sag inward that the knee joint. Her range of motion is 120. She has tenderness over the medial joint line normal muscle strength and muscle tone with no instability and negative McMurray's  The opposite left leg appears to be in similar valgus position no tenderness no swelling normal range of motion stability and strength  Getting back to the right knee she does have a positive joint effusion grade 4  Imaging x-rays were done in the office she has a valgus knee with moderate amount of lateral joint space narrowing significant osteophytes in the patellofemoral joint and effusion noted    Assessment:   Diagnosis joint effusion Diagnosis osteoarthritis right knee with valgus alignment    Plan:   Aspiration and injection and a followup in one month  Aspiration RIGHT knee Verbal consent and timeout were completed for a RIGHT knee aspiration and injection  Under sterile conditions the RIGHT knee was aspirated from a lateral approach.  Findings were clear yellow fluid 30 cc, this was followed by injection of 40 mg of Depo-Medrol and 3 cc 1% lidocaine.  A sterile dressing was applied there were no complications

## 2012-05-16 NOTE — Patient Instructions (Addendum)
You have received a steroid shot. 15% of patients experience increased pain at the injection site with in the next 24 hours. This is best treated with ice and tylenol extra strength 2 tabs every 8 hours. If you are still having pain please call the office.   Arthritis  knee with effusion/joint swelling

## 2012-06-13 ENCOUNTER — Ambulatory Visit: Payer: Medicare Other | Admitting: Orthopedic Surgery

## 2012-06-20 ENCOUNTER — Encounter: Payer: Self-pay | Admitting: Orthopedic Surgery

## 2012-06-20 ENCOUNTER — Ambulatory Visit (INDEPENDENT_AMBULATORY_CARE_PROVIDER_SITE_OTHER): Payer: Medicare Other | Admitting: Orthopedic Surgery

## 2012-06-20 VITALS — BP 160/100 | Ht 61.5 in | Wt 240.0 lb

## 2012-06-20 DIAGNOSIS — M1990 Unspecified osteoarthritis, unspecified site: Secondary | ICD-10-CM | POA: Diagnosis not present

## 2012-06-20 DIAGNOSIS — M1710 Unilateral primary osteoarthritis, unspecified knee: Secondary | ICD-10-CM | POA: Insufficient documentation

## 2012-06-20 DIAGNOSIS — E669 Obesity, unspecified: Secondary | ICD-10-CM | POA: Diagnosis not present

## 2012-06-20 DIAGNOSIS — I10 Essential (primary) hypertension: Secondary | ICD-10-CM | POA: Diagnosis not present

## 2012-06-20 NOTE — Progress Notes (Signed)
Patient ID: Jennifer Bennett, female   DOB: 31-Dec-1945, 67 y.o.   MRN: [id number] Chief Complaint  Patient presents with  . Follow-up    Right knee s/p injection and aspiration    BP 160/100  Ht 5' 1.5" (1.562 m)  Wt 240 lb (108.863 kg)  BMI 44.62 kg/m2  History the patient complains of less pain but still difficult for her to get up and for her to walk she is interested in a brace for her right leg  Review of systems no catching locking or frank giving way episodes,ROS BP high med changed to clonidine associated with dizziness. She is on her way to see her primary care doctor  Exam BP 160/100  Ht 5' 1.5" (1.562 m)  Wt 240 lb (108.863 kg)  BMI 44.62 kg/m2  General appearance is normal, the patient is alert and oriented x3 with normal mood and affect. Right knee still has joint effusion less tenderness range of motion flexion equals 110 knee feels stable muscle tone is normal scans intact  OA (osteoarthritis) of knee   Recommend brace economy hinged wraparound style followup 6 months x-ray

## 2012-06-20 NOTE — Patient Instructions (Signed)
Brace right knee

## 2012-07-03 DIAGNOSIS — I10 Essential (primary) hypertension: Secondary | ICD-10-CM | POA: Diagnosis not present

## 2012-07-03 DIAGNOSIS — E669 Obesity, unspecified: Secondary | ICD-10-CM | POA: Diagnosis not present

## 2012-07-19 ENCOUNTER — Other Ambulatory Visit: Payer: Self-pay | Admitting: Family Medicine

## 2012-07-19 DIAGNOSIS — Z1231 Encounter for screening mammogram for malignant neoplasm of breast: Secondary | ICD-10-CM

## 2012-08-15 DIAGNOSIS — I10 Essential (primary) hypertension: Secondary | ICD-10-CM | POA: Diagnosis not present

## 2012-08-15 DIAGNOSIS — L259 Unspecified contact dermatitis, unspecified cause: Secondary | ICD-10-CM | POA: Diagnosis not present

## 2012-08-22 ENCOUNTER — Ambulatory Visit
Admission: RE | Admit: 2012-08-22 | Discharge: 2012-08-22 | Disposition: A | Discharge status: Home / Self Care | Payer: Medicare Other | Source: Ambulatory Visit | Attending: Family Medicine | Admitting: Family Medicine

## 2012-08-22 DIAGNOSIS — Z1231 Encounter for screening mammogram for malignant neoplasm of breast: Secondary | ICD-10-CM | POA: Diagnosis not present

## 2012-10-03 DIAGNOSIS — N390 Urinary tract infection, site not specified: Secondary | ICD-10-CM | POA: Diagnosis not present

## 2012-10-03 DIAGNOSIS — R300 Dysuria: Secondary | ICD-10-CM | POA: Diagnosis not present

## 2012-10-03 DIAGNOSIS — J3489 Other specified disorders of nose and nasal sinuses: Secondary | ICD-10-CM | POA: Diagnosis not present

## 2012-11-14 DIAGNOSIS — I10 Essential (primary) hypertension: Secondary | ICD-10-CM | POA: Diagnosis not present

## 2012-12-26 ENCOUNTER — Ambulatory Visit (INDEPENDENT_AMBULATORY_CARE_PROVIDER_SITE_OTHER): Payer: Medicare Other

## 2012-12-26 ENCOUNTER — Ambulatory Visit (INDEPENDENT_AMBULATORY_CARE_PROVIDER_SITE_OTHER): Payer: Medicare Other | Admitting: Orthopedic Surgery

## 2012-12-26 ENCOUNTER — Encounter: Payer: Self-pay | Admitting: Orthopedic Surgery

## 2012-12-26 VITALS — BP 155/85 | Ht 61.5 in | Wt 238.0 lb

## 2012-12-26 DIAGNOSIS — M1711 Unilateral primary osteoarthritis, right knee: Secondary | ICD-10-CM

## 2012-12-26 DIAGNOSIS — M1710 Unilateral primary osteoarthritis, unspecified knee: Secondary | ICD-10-CM

## 2012-12-26 NOTE — Progress Notes (Signed)
Patient ID: Jennifer Bennett, female   DOB: 09/22/1945, 67 y.o.   MRN: [id number]  Chief Complaint  Patient presents with  . Follow-up    6 month recheck right knee with xray    Followup osteoarthritis right knee currently doing well but not tolerating brace due to sizing issues  Review of systems no catching locking or giving way  She's walking without support her knee is not swollen or tender her knee flexion is 115 her knee is stable strength is normal scans intact she has good distal pulse. appearance is normal, the patient is alert and oriented x3 with normal mood and affect.  X-ray shows lateral joint space narrowing consistent with valgus osteoarthritis no change versus previous film  Recommend continue goody powder at night followup 6 months repeat x-ray

## 2012-12-26 NOTE — Patient Instructions (Signed)
Goody Powder at night is ok

## 2013-01-22 DIAGNOSIS — K5289 Other specified noninfective gastroenteritis and colitis: Secondary | ICD-10-CM | POA: Diagnosis not present

## 2013-01-22 DIAGNOSIS — K219 Gastro-esophageal reflux disease without esophagitis: Secondary | ICD-10-CM | POA: Diagnosis not present

## 2013-01-22 DIAGNOSIS — Z23 Encounter for immunization: Secondary | ICD-10-CM | POA: Diagnosis not present

## 2013-05-08 DIAGNOSIS — I10 Essential (primary) hypertension: Secondary | ICD-10-CM | POA: Diagnosis not present

## 2013-05-08 DIAGNOSIS — J218 Acute bronchiolitis due to other specified organisms: Secondary | ICD-10-CM | POA: Diagnosis not present

## 2013-06-26 ENCOUNTER — Ambulatory Visit: Payer: Medicare Other | Admitting: Orthopedic Surgery

## 2013-07-24 ENCOUNTER — Encounter: Payer: Self-pay | Admitting: Orthopedic Surgery

## 2013-07-24 ENCOUNTER — Ambulatory Visit (INDEPENDENT_AMBULATORY_CARE_PROVIDER_SITE_OTHER): Payer: Medicare Other

## 2013-07-24 ENCOUNTER — Ambulatory Visit (INDEPENDENT_AMBULATORY_CARE_PROVIDER_SITE_OTHER): Payer: Medicare Other | Admitting: Orthopedic Surgery

## 2013-07-24 VITALS — BP 128/74 | Ht 61.5 in | Wt 232.0 lb

## 2013-07-24 DIAGNOSIS — M1711 Unilateral primary osteoarthritis, right knee: Secondary | ICD-10-CM

## 2013-07-24 DIAGNOSIS — M1710 Unilateral primary osteoarthritis, unspecified knee: Secondary | ICD-10-CM | POA: Diagnosis not present

## 2013-07-24 DIAGNOSIS — M109 Gout, unspecified: Secondary | ICD-10-CM

## 2013-07-24 DIAGNOSIS — IMO0002 Reserved for concepts with insufficient information to code with codable children: Secondary | ICD-10-CM | POA: Diagnosis not present

## 2013-07-24 NOTE — Progress Notes (Signed)
Patient ID: Jennifer Bennett, female   DOB: [birth year], 68 y.o.   MRN: [id number]  Chief Complaint  Patient presents with  . Follow-up    6 month recheck on rigth knee with xray.    Osteoarthritis right knee recheck right knee with x-ray. Patient says right knee is doing well no problems at this time. X-rays look good today with no progression of her disease  New complaint pain right great toe with history of gout complains of pain swelling redness around the right great toe x24 hours  Patient on allopurinol  No fever. No rash.  Ht 5' 1.5" (1.562 m)  Wt 232 lb (105.235 kg)  BMI 43.13 kg/m2 General appearance is normal, the patient is alert and oriented x3 with normal mood and affect.  Ambulating fairly well with a cane no noticeable limp Right great toe swollen mild bunion deformity painful range of motion redness of the skin no atrophy in the foot no instability in the toe good dorsal pulses normal sensation  Probable gout Treat with steroids  X-ray knee again in 6 months

## 2013-07-24 NOTE — Patient Instructions (Signed)
Start prednisone 5 mg take pack as directed for 12 days

## 2013-08-07 DIAGNOSIS — I10 Essential (primary) hypertension: Secondary | ICD-10-CM | POA: Diagnosis not present

## 2013-08-07 DIAGNOSIS — M1990 Unspecified osteoarthritis, unspecified site: Secondary | ICD-10-CM | POA: Diagnosis not present

## 2013-11-13 DIAGNOSIS — I10 Essential (primary) hypertension: Secondary | ICD-10-CM | POA: Diagnosis not present

## 2013-11-13 DIAGNOSIS — E669 Obesity, unspecified: Secondary | ICD-10-CM | POA: Diagnosis not present

## 2013-11-13 DIAGNOSIS — J309 Allergic rhinitis, unspecified: Secondary | ICD-10-CM | POA: Diagnosis not present

## 2013-12-25 DIAGNOSIS — M1990 Unspecified osteoarthritis, unspecified site: Secondary | ICD-10-CM | POA: Diagnosis not present

## 2013-12-25 DIAGNOSIS — K219 Gastro-esophageal reflux disease without esophagitis: Secondary | ICD-10-CM | POA: Diagnosis not present

## 2013-12-25 DIAGNOSIS — I10 Essential (primary) hypertension: Secondary | ICD-10-CM | POA: Diagnosis not present

## 2014-01-22 ENCOUNTER — Encounter: Payer: Self-pay | Admitting: Orthopedic Surgery

## 2014-01-22 ENCOUNTER — Ambulatory Visit (INDEPENDENT_AMBULATORY_CARE_PROVIDER_SITE_OTHER): Payer: Medicare Other

## 2014-01-22 ENCOUNTER — Ambulatory Visit (INDEPENDENT_AMBULATORY_CARE_PROVIDER_SITE_OTHER): Payer: Medicare Other | Admitting: Orthopedic Surgery

## 2014-01-22 VITALS — BP 113/64 | Ht 61.5 in | Wt 232.0 lb

## 2014-01-22 DIAGNOSIS — M1711 Unilateral primary osteoarthritis, right knee: Secondary | ICD-10-CM | POA: Insufficient documentation

## 2014-01-22 DIAGNOSIS — M25561 Pain in right knee: Secondary | ICD-10-CM

## 2014-01-22 NOTE — Progress Notes (Signed)
Chief Complaint  Patient presents with  . Follow-up    6 month recheck + xray Right knee    HPI Comments: This is a followup visit regarding right knee arthritis. The patient did well over the last 6 months with no increase in pain no new symptoms  The gout in the great toe was treated with steroids and did well.  Today her right knee will be x-rayed  She is moving around 12. She is also status post total hip stable.  She is having some back discomfort with tired feeling after walking and weakness after walking or standing for long periods of time  The x-ray today shows no progression in a valgus osteoarthritis there is also some patellofemoral arthritis  We will x-ray her again in 6 months and we will readdress the lumbar spine which has had a surgical procedure in the past     Objective:      General :   alert and cooperative  Gait: Normal. The patient can bear weight on the injured extremity.  Right Lower Extremity       Knee Effusion:  None.  Ecchymosis:  none  Knee ROM:  5 to 120 degrees without subpatellar   crepitance.  Patella:  Patella does track normally. Patellar apprehension test: negative Patellar compression test: negative  Tenderness: lateral joint line  Stability:  Lachman's test: negative Posterior drawer: negative Medial collateral ligament: negative Lateral collateral ligament: negative       McMurray's Test:  negative with no joint line tenderness  Sensation:   intact to light touch  Pulses: normal DP and PT pulses   Imaging X-rays: 4 views of the knee demonstrate no remarkable findings.    Assessment:    DJDright knee.    Plan:     return for repeat evaluation Follow up: 6 months.

## 2014-01-22 NOTE — Patient Instructions (Addendum)
Activities as tolerated  Osteoarthritis Osteoarthritis is a disease that causes soreness and inflammation of a joint. It occurs when the cartilage at the affected joint wears down. Cartilage acts as a cushion, covering the ends of bones where they meet to form a joint. Osteoarthritis is the most common form of arthritis. It often occurs in older people. The joints affected most often by this condition include those in the:  Ends of the fingers.  Thumbs.  Neck.  Lower back.  Knees.  Hips. CAUSES  Over time, the cartilage that covers the ends of bones begins to wear away. This causes bone to rub on bone, producing pain and stiffness in the affected joints.  RISK FACTORS Certain factors can increase your chances of having osteoarthritis, including:  Older age.  Excessive body weight.  Overuse of joints.  Previous joint injury. SIGNS AND SYMPTOMS   Pain, swelling, and stiffness in the joint.  Over time, the joint may lose its normal shape.  Small deposits of bone (osteophytes) may grow on the edges of the joint.  Bits of bone or cartilage can break off and float inside the joint space. This may cause more pain and damage. DIAGNOSIS  Your health care provider will do a physical exam and ask about your symptoms. Various tests may be ordered, such as:  X-rays of the affected joint.  An MRI scan.  Blood tests to rule out other types of arthritis.  Joint fluid tests. This involves using a needle to draw fluid from the joint and examining the fluid under a microscope. TREATMENT  Goals of treatment are to control pain and improve joint function. Treatment plans may include:  A prescribed exercise program that allows for rest and joint relief.  A weight control plan.  Pain relief techniques, such as:  Properly applied heat and cold.  Electric pulses delivered to nerve endings under the skin (transcutaneous electrical nerve stimulation [TENS]).  Massage.  Certain  nutritional supplements.  Medicines to control pain, such as:  Acetaminophen.  Nonsteroidal anti-inflammatory drugs (NSAIDs), such as naproxen.  Narcotic or central-acting agents, such as tramadol.  Corticosteroids. These can be given orally or as an injection.  Surgery to reposition the bones and relieve pain (osteotomy) or to remove loose pieces of bone and cartilage. Joint replacement may be needed in advanced states of osteoarthritis. HOME CARE INSTRUCTIONS   Take medicines only as directed by your health care provider.  Maintain a healthy weight. Follow your health care provider's instructions for weight control. This may include dietary instructions.  Exercise as directed. Your health care provider can recommend specific types of exercise. These may include:  Strengthening exercises. These are done to strengthen the muscles that support joints affected by arthritis. They can be performed with weights or with exercise bands to add resistance.  Aerobic activities. These are exercises, such as brisk walking or low-impact aerobics, that get your heart pumping.  Range-of-motion activities. These keep your joints limber.  Balance and agility exercises. These help you maintain daily living skills.  Rest your affected joints as directed by your health care provider.  Keep all follow-up visits as directed by your health care provider. SEEK MEDICAL CARE IF:   Your skin turns red.  You develop a rash in addition to your joint pain.  You have worsening joint pain.  You have a fever along with joint or muscle aches. SEEK IMMEDIATE MEDICAL CARE IF:  You have a significant loss of weight or appetite.  You have  night sweats. [place] of Arthritis and Musculoskeletal and Skin Diseases: www.niams.SouthExposed.es  [employer] on Aging: http://kim-miller.com/  American College of Rheumatology: www.rheumatology.org Document Released: 03/29/2005 Document  Revised: 08/13/2013 Document Reviewed: 12/04/2012 [facility] Patient Information 2015 [place], [place]. This information is not intended to replace advice given to you by your health care provider. Make sure you discuss any questions you have with your health care provider.

## 2014-02-19 DIAGNOSIS — Z1231 Encounter for screening mammogram for malignant neoplasm of breast: Secondary | ICD-10-CM | POA: Diagnosis not present

## 2014-03-26 DIAGNOSIS — I10 Essential (primary) hypertension: Secondary | ICD-10-CM | POA: Diagnosis not present

## 2014-03-26 DIAGNOSIS — J069 Acute upper respiratory infection, unspecified: Secondary | ICD-10-CM | POA: Diagnosis not present

## 2014-04-02 DIAGNOSIS — R05 Cough: Secondary | ICD-10-CM | POA: Diagnosis not present

## 2014-06-21 ENCOUNTER — Emergency Department (HOSPITAL_COMMUNITY)
Admission: EM | Admit: 2014-06-21 | Discharge: 2014-06-21 | Disposition: A | Discharge status: Home / Self Care | Payer: Medicare Other | Attending: Emergency Medicine | Admitting: Emergency Medicine

## 2014-06-21 ENCOUNTER — Encounter (HOSPITAL_COMMUNITY): Payer: Self-pay | Admitting: Emergency Medicine

## 2014-06-21 DIAGNOSIS — Z720 Tobacco use: Secondary | ICD-10-CM | POA: Insufficient documentation

## 2014-06-21 DIAGNOSIS — Z8639 Personal history of other endocrine, nutritional and metabolic disease: Secondary | ICD-10-CM | POA: Insufficient documentation

## 2014-06-21 DIAGNOSIS — Z7982 Long term (current) use of aspirin: Secondary | ICD-10-CM | POA: Insufficient documentation

## 2014-06-21 DIAGNOSIS — Z7983 Long term (current) use of bisphosphonates: Secondary | ICD-10-CM | POA: Insufficient documentation

## 2014-06-21 DIAGNOSIS — R51 Headache: Secondary | ICD-10-CM | POA: Diagnosis present

## 2014-06-21 DIAGNOSIS — Z8719 Personal history of other diseases of the digestive system: Secondary | ICD-10-CM | POA: Insufficient documentation

## 2014-06-21 DIAGNOSIS — Z79899 Other long term (current) drug therapy: Secondary | ICD-10-CM | POA: Insufficient documentation

## 2014-06-21 DIAGNOSIS — Z880 Allergy status to penicillin: Secondary | ICD-10-CM | POA: Insufficient documentation

## 2014-06-21 DIAGNOSIS — R519 Headache, unspecified: Secondary | ICD-10-CM

## 2014-06-21 DIAGNOSIS — I10 Essential (primary) hypertension: Secondary | ICD-10-CM | POA: Diagnosis not present

## 2014-06-21 MED ORDER — OXYCODONE-ACETAMINOPHEN 5-325 MG PO TABS: 1.0000 | ORAL_TABLET | Freq: Four times a day (QID) | ORAL | Status: DC | PRN

## 2014-06-21 MED ORDER — PREDNISONE 10 MG PO TABS: 20.0000 mg | ORAL_TABLET | Freq: Two times a day (BID) | ORAL | Status: DC

## 2014-06-21 MED ORDER — OXYCODONE-ACETAMINOPHEN 5-325 MG PO TABS
2.0000 | ORAL_TABLET | Freq: Once | ORAL | Status: AC
  Administered 2014-06-21: 2 via ORAL
  Filled 2014-06-21: qty 2

## 2014-06-21 MED ORDER — AZITHROMYCIN 250 MG PO TABS: ORAL_TABLET | ORAL | Status: DC

## 2014-06-21 NOTE — Discharge Instructions (Signed)
Zithromax and prednisone as prescribed.  Percocet as needed for pain.  Follow-up with your primary Dr. if not improving in the next week.   Sinus Headache A sinus headache is when your sinuses become clogged or swollen. Sinus headaches can range from mild to severe.  CAUSES A sinus headache can have different causes, such as:  Colds.  Sinus infections.  Allergies. SYMPTOMS  Symptoms of a sinus headache may vary and can include:  Headache.  Pain or pressure in the face.  Congested or runny nose.  Fever.  Inability to smell.  Pain in upper teeth. Weather changes can make symptoms worse. TREATMENT  The treatment of a sinus headache depends on the cause.  Sinus pain caused by a sinus infection may be treated with antibiotic medicine.  Sinus pain caused by allergies may be helped by allergy medicines (antihistamines) and medicated nasal sprays.  Sinus pain caused by congestion may be helped by flushing the nose and sinuses with saline solution. HOME CARE INSTRUCTIONS   If antibiotics are prescribed, take them as directed. Finish them even if you start to feel better.  Only take over-the-counter or prescription medicines for pain, discomfort, or fever as directed by your caregiver.  If you have congestion, use a nasal spray to help reduce pressure. SEEK IMMEDIATE MEDICAL CARE IF:  You have a fever.  You have headaches more than once a week.  You have sensitivity to light or sound.  You have repeated nausea and vomiting.  You have vision problems.  You have sudden, severe pain in your face or head.  You have a seizure.  You are confused.  Your sinus headaches do not get better after treatment. Many people think they have a sinus headache when they actually have migraines or tension headaches. MAKE SURE YOU:   Understand these instructions.  Will watch your condition.  Will get help right away if you are not doing well or get worse. Document Released:  05/06/2004 Document Revised: 06/21/2011 Document Reviewed: 06/27/2010 [facility] Patient Information 2015 [place], [place]. This information is not intended to replace advice given to you by your health care provider. Make sure you discuss any questions you have with your health care provider.

## 2014-06-21 NOTE — ED Notes (Signed)
Patient reports sinus congestion and sharp pains to left side of head. States "I think I'm having a bad sinus headache." states head starting hurting yesterday and has worsened. Patient crying during triage.

## 2014-06-21 NOTE — ED Provider Notes (Signed)
CSN: [id number]     Arrival date & time 06/21/14  7829 History   First MD Initiated Contact with Patient 06/21/14 [phone]     Chief Complaint  Patient presents with  . Facial Pain  . Headache     (Consider location/radiation/quality/duration/timing/severity/associated sxs/prior Treatment) HPI Comments: Patient is a 69 year old female with history of hypertension, high cholesterol. She presents for evaluation of sinus congestion for several days. 2 days ago she started with sharp, stabbing pains to the side of her head behind her left ear. These have been very uncomfortable. She denies any visual disturbances. She denies any hearing loss.  Patient is a 69 y.o. female presenting with headaches. The history is provided by the patient.  Headache Pain location:  L parietal Quality:  Sharp and stabbing Radiates to:  Does not radiate Onset quality:  Sudden Duration:  2 days Timing:  Constant Progression:  Worsening Chronicity:  New Similar to prior headaches: no   Relieved by:  Nothing Worsened by:  Nothing Ineffective treatments:  None tried   Past Medical History  Diagnosis Date  . High blood pressure   . High cholesterol   . Acid reflux    Past Surgical History  Procedure Laterality Date  . Hip surgery    . Carpal tunnel release    . Back surgery    . Vesicovaginal fistula closure w/ tah     Family History  Problem Relation Age of Onset  . Cancer    . Arthritis    . Asthma     History  Substance Use Topics  . Smoking status: Current Every Day Smoker  . Smokeless tobacco: Not on file  . Alcohol Use: No   OB History    No data available     Review of Systems  Neurological: Positive for headaches.  All other systems reviewed and are negative.     Allergies  Ace inhibitors; Advil; and Penicillins  Home Medications   Prior to Admission medications   Medication Sig Start Date End Date Taking? Authorizing Provider  allopurinol (ZYLOPRIM) 100 MG tablet Take  100 mg by mouth daily.   Yes Historical Provider, MD  aspirin 81 MG tablet Take 81 mg by mouth daily.   Yes Historical Provider, MD  chlorpheniramine-phenylephrine (CARDEC) 1-3.5 MG/ML LIQD Take by mouth 4 (four) times daily as needed.   Yes Historical Provider, MD  cloNIDine (CATAPRES) 0.1 MG tablet Take 0.1 mg by mouth daily.   Yes Historical Provider, MD  gemfibrozil (LOPID) 600 MG tablet Take 600 mg by mouth 2 (two) times daily before a meal.   Yes Historical Provider, MD  hydrochlorothiazide (HYDRODIURIL) 25 MG tablet Take 25 mg by mouth daily.   Yes Historical Provider, MD  metoprolol (LOPRESSOR) 50 MG tablet Take 50 mg by mouth 2 (two) times daily.   Yes Historical Provider, MD  citalopram (CELEXA) 10 MG tablet Take 10 mg by mouth daily.    Historical Provider, MD  gabapentin (NEURONTIN) 100 MG capsule Take 100 mg by mouth 3 (three) times daily.    Historical Provider, MD  HYDROcodone-acetaminophen (NORCO/VICODIN) 5-325 MG per tablet Take 1 tablet by mouth every 6 (six) hours as needed.    Historical Provider, MD  methyldopa-hydrochlorothiazide (ALDORIL-25) 250-25 MG per tablet Take 1 tablet by mouth daily.    Historical Provider, MD  risedronate (ACTONEL) 35 MG tablet Take 35 mg by mouth every 7 (seven) days. with water on empty stomach, nothing by mouth or lie down for next  30 minutes.    Historical Provider, MD   BP 163/75 mmHg  Pulse 86  Temp(Src) 98.1 F (36.7 C) (Oral)  Resp 22  Ht 5\' 1"  (1.549 m)  Wt 230 lb (104.327 kg)  BMI 43.48 kg/m2  SpO2 97% Physical Exam  Constitutional: She is oriented to person, place, and time. She appears well-developed and well-nourished. No distress.  HENT:  Head: Normocephalic and atraumatic.  Mouth/Throat: Oropharynx is clear and moist.  The right TM is clear. The left TM is somewhat erythematous.  There is no tenderness over the mastoid.  Neck: Normal range of motion. Neck supple.  Cardiovascular: Normal rate and regular rhythm.  Exam  reveals no gallop and no friction rub.   No murmur heard. Pulmonary/Chest: Effort normal and breath sounds normal. No respiratory distress. She has no wheezes.  Abdominal: Soft. Bowel sounds are normal. She exhibits no distension. There is no tenderness.  Musculoskeletal: Normal range of motion.  Neurological: She is alert and oriented to person, place, and time. No cranial nerve deficit. She exhibits normal muscle tone. Coordination normal.  Skin: Skin is warm and dry. She is not diaphoretic.  Nursing note and vitals reviewed.   ED Course  Procedures (including critical care time) Labs Review Labs Reviewed - No data to display  Imaging Review No results found.   EKG Interpretation None      MDM   Final diagnoses:  None    Patient presents with what she believes is a sinus headache. She will be treated with antibiotics and pain medication. Some components of her history are concerning for trigeminal neuralgia. She is already taking Neurontin. I will add prednisone, Percocet, and prescribed Zithromax for what may be a left otitis media and sinus infection.    Veryl Speak, MD 06/21/14 [phone]

## 2014-07-23 ENCOUNTER — Encounter: Payer: Self-pay | Admitting: Orthopedic Surgery

## 2014-07-23 ENCOUNTER — Ambulatory Visit (INDEPENDENT_AMBULATORY_CARE_PROVIDER_SITE_OTHER): Payer: Medicare Other

## 2014-07-23 ENCOUNTER — Ambulatory Visit (INDEPENDENT_AMBULATORY_CARE_PROVIDER_SITE_OTHER): Payer: Medicare Other | Admitting: Orthopedic Surgery

## 2014-07-23 VITALS — Ht 61.0 in | Wt 230.0 lb

## 2014-07-23 DIAGNOSIS — M1710 Unilateral primary osteoarthritis, unspecified knee: Secondary | ICD-10-CM

## 2014-07-23 DIAGNOSIS — M129 Arthropathy, unspecified: Secondary | ICD-10-CM

## 2014-07-23 NOTE — Progress Notes (Signed)
Patient ID: Jennifer Bennett, female   DOB: [birth year], 69 y.o.   MRN: [id number] Patient comes in for six-month annual semiannual x-ray of her right knee for ongoing symptoms and right knee arthritis  She's having slightly more pain but functioning very well at this point review of systems generalized leg aching  Vitals are stable she is alert awake and oriented 3 mood and affect are normal. Her appearance is normal. She is ambulating well without assistive devices.  Stability strength and alignment of the knee remains unchanged neurovascular exam is intact  X-ray ordered and read as moderate arthritis of the knee  Recommend six-month x-ray repeat

## 2014-07-30 ENCOUNTER — Other Ambulatory Visit: Payer: Self-pay | Admitting: Family Medicine

## 2014-07-30 ENCOUNTER — Ambulatory Visit
Admission: RE | Admit: 2014-07-30 | Discharge: 2014-07-30 | Disposition: A | Discharge status: Home / Self Care | Payer: Medicare Other | Source: Ambulatory Visit | Attending: Family Medicine | Admitting: Family Medicine

## 2014-07-30 DIAGNOSIS — M810 Age-related osteoporosis without current pathological fracture: Secondary | ICD-10-CM | POA: Diagnosis not present

## 2014-07-30 DIAGNOSIS — M4326 Fusion of spine, lumbar region: Secondary | ICD-10-CM | POA: Diagnosis not present

## 2014-07-30 DIAGNOSIS — I10 Essential (primary) hypertension: Secondary | ICD-10-CM | POA: Diagnosis not present

## 2014-07-30 DIAGNOSIS — R785 Finding of other psychotropic drug in blood: Secondary | ICD-10-CM | POA: Diagnosis not present

## 2014-07-30 DIAGNOSIS — R52 Pain, unspecified: Secondary | ICD-10-CM

## 2014-07-30 DIAGNOSIS — E785 Hyperlipidemia, unspecified: Secondary | ICD-10-CM | POA: Diagnosis not present

## 2014-07-30 DIAGNOSIS — Z79899 Other long term (current) drug therapy: Secondary | ICD-10-CM | POA: Diagnosis not present

## 2014-07-30 DIAGNOSIS — M47816 Spondylosis without myelopathy or radiculopathy, lumbar region: Secondary | ICD-10-CM | POA: Diagnosis not present

## 2014-07-30 DIAGNOSIS — M545 Low back pain: Secondary | ICD-10-CM | POA: Diagnosis not present

## 2014-07-30 DIAGNOSIS — M4316 Spondylolisthesis, lumbar region: Secondary | ICD-10-CM | POA: Diagnosis not present

## 2014-07-30 DIAGNOSIS — M5137 Other intervertebral disc degeneration, lumbosacral region: Secondary | ICD-10-CM | POA: Diagnosis not present

## 2014-07-30 DIAGNOSIS — G8929 Other chronic pain: Secondary | ICD-10-CM | POA: Diagnosis not present

## 2014-08-07 DIAGNOSIS — M1990 Unspecified osteoarthritis, unspecified site: Secondary | ICD-10-CM | POA: Diagnosis not present

## 2014-08-07 DIAGNOSIS — I10 Essential (primary) hypertension: Secondary | ICD-10-CM | POA: Diagnosis not present

## 2014-08-07 DIAGNOSIS — M5416 Radiculopathy, lumbar region: Secondary | ICD-10-CM | POA: Diagnosis not present

## 2014-09-10 DIAGNOSIS — I10 Essential (primary) hypertension: Secondary | ICD-10-CM | POA: Diagnosis not present

## 2014-09-10 DIAGNOSIS — M1990 Unspecified osteoarthritis, unspecified site: Secondary | ICD-10-CM | POA: Diagnosis not present

## 2014-09-24 ENCOUNTER — Other Ambulatory Visit (HOSPITAL_COMMUNITY): Payer: Self-pay | Admitting: Family Medicine

## 2014-09-24 DIAGNOSIS — M5442 Lumbago with sciatica, left side: Secondary | ICD-10-CM

## 2014-10-04 ENCOUNTER — Ambulatory Visit (HOSPITAL_COMMUNITY)
Admission: RE | Admit: 2014-10-04 | Discharge: 2014-10-04 | Disposition: A | Discharge status: Home / Self Care | Payer: Medicare Other | Source: Ambulatory Visit | Attending: Family Medicine | Admitting: Family Medicine

## 2014-10-04 DIAGNOSIS — M5442 Lumbago with sciatica, left side: Secondary | ICD-10-CM

## 2014-10-08 ENCOUNTER — Other Ambulatory Visit: Payer: Self-pay | Admitting: Family Medicine

## 2014-10-08 DIAGNOSIS — M5442 Lumbago with sciatica, left side: Secondary | ICD-10-CM

## 2014-10-28 ENCOUNTER — Ambulatory Visit
Admission: RE | Admit: 2014-10-28 | Discharge: 2014-10-28 | Disposition: A | Discharge status: Home / Self Care | Payer: Medicare Other | Source: Ambulatory Visit | Attending: Family Medicine | Admitting: Family Medicine

## 2014-10-28 DIAGNOSIS — M5442 Lumbago with sciatica, left side: Secondary | ICD-10-CM

## 2014-10-29 ENCOUNTER — Ambulatory Visit
Admission: RE | Admit: 2014-10-29 | Discharge: 2014-10-29 | Disposition: A | Discharge status: Home / Self Care | Payer: Medicare Other | Source: Ambulatory Visit | Attending: Family Medicine | Admitting: Family Medicine

## 2014-10-29 ENCOUNTER — Other Ambulatory Visit: Payer: Self-pay | Admitting: Family Medicine

## 2014-10-29 DIAGNOSIS — M4727 Other spondylosis with radiculopathy, lumbosacral region: Secondary | ICD-10-CM | POA: Diagnosis not present

## 2014-10-29 DIAGNOSIS — M5442 Lumbago with sciatica, left side: Secondary | ICD-10-CM

## 2014-10-29 DIAGNOSIS — M5116 Intervertebral disc disorders with radiculopathy, lumbar region: Secondary | ICD-10-CM | POA: Diagnosis not present

## 2014-10-29 DIAGNOSIS — Z981 Arthrodesis status: Secondary | ICD-10-CM | POA: Diagnosis not present

## 2014-11-12 DIAGNOSIS — M4800 Spinal stenosis, site unspecified: Secondary | ICD-10-CM | POA: Diagnosis not present

## 2014-11-12 DIAGNOSIS — I10 Essential (primary) hypertension: Secondary | ICD-10-CM | POA: Diagnosis not present

## 2014-12-03 DIAGNOSIS — I10 Essential (primary) hypertension: Secondary | ICD-10-CM | POA: Diagnosis not present

## 2015-01-21 ENCOUNTER — Ambulatory Visit: Payer: Medicare Other | Admitting: Orthopedic Surgery

## 2015-02-04 ENCOUNTER — Encounter: Payer: Self-pay | Admitting: Orthopedic Surgery

## 2015-02-04 ENCOUNTER — Ambulatory Visit (INDEPENDENT_AMBULATORY_CARE_PROVIDER_SITE_OTHER): Payer: Medicare Other

## 2015-02-04 ENCOUNTER — Ambulatory Visit (INDEPENDENT_AMBULATORY_CARE_PROVIDER_SITE_OTHER): Payer: Medicare Other | Admitting: Orthopedic Surgery

## 2015-02-04 VITALS — BP 122/64 | Ht 61.0 in | Wt 240.0 lb

## 2015-02-04 DIAGNOSIS — M129 Arthropathy, unspecified: Secondary | ICD-10-CM

## 2015-02-04 DIAGNOSIS — Z23 Encounter for immunization: Secondary | ICD-10-CM | POA: Diagnosis not present

## 2015-02-04 DIAGNOSIS — M1710 Unilateral primary osteoarthritis, unspecified knee: Secondary | ICD-10-CM

## 2015-02-04 NOTE — Progress Notes (Signed)
Patient ID: Jennifer Bennett, female   DOB: 07-01-1945, 69 y.o.   MRN: [id number]  Follow up visit  Chief Complaint  Patient presents with  . Follow-up    6 month follow up + xray right knee    BP 122/64 mmHg  Ht 5\' 1"  (1.549 m)  Wt 240 lb (108.863 kg)  BMI 45.37 kg/m2  Encounter Diagnosis  Name Primary?  Marland Kitchen Arthritis of knee Yes   History Six-month follow-up x-ray right knee for right knee pain  Currently patient's symptoms are mild aching pain in the right knee  Review of systems are occasional heel pain  Exam vital signs stable as recorded  Knee examination knee flexion approximately 120 comes to full extension. Mild lateral tenderness. Motor exam normal knee stable neurovascular exam intact gait normal  Imaging. Compared x-ray from April of this year to the one today she has valgus osteoarthritis patellofemoral arthritis which have not progressed  I have dictated a report and is incorporated by reference as it was an independent interpretation of the film  Plan strain 6 months right knee

## 2015-02-11 DIAGNOSIS — Z6841 Body Mass Index (BMI) 40.0 and over, adult: Secondary | ICD-10-CM | POA: Diagnosis not present

## 2015-02-11 DIAGNOSIS — I10 Essential (primary) hypertension: Secondary | ICD-10-CM | POA: Diagnosis not present

## 2015-02-11 DIAGNOSIS — E785 Hyperlipidemia, unspecified: Secondary | ICD-10-CM | POA: Diagnosis not present

## 2015-05-20 DIAGNOSIS — I10 Essential (primary) hypertension: Secondary | ICD-10-CM | POA: Diagnosis not present

## 2015-05-20 DIAGNOSIS — E785 Hyperlipidemia, unspecified: Secondary | ICD-10-CM | POA: Diagnosis not present

## 2015-05-20 DIAGNOSIS — E669 Obesity, unspecified: Secondary | ICD-10-CM | POA: Diagnosis not present

## 2015-06-26 IMAGING — CR DG SACRUM/COCCYX 2+V
3 series · 3 of 3 positions shown · non-contrast
Comparison: Lumbar radiography same day

CLINICAL DATA: Worsening lumbosacral pain.

EXAM:
SACRUM AND COCCYX - 2+ VIEW

[t sacrum a.p.]
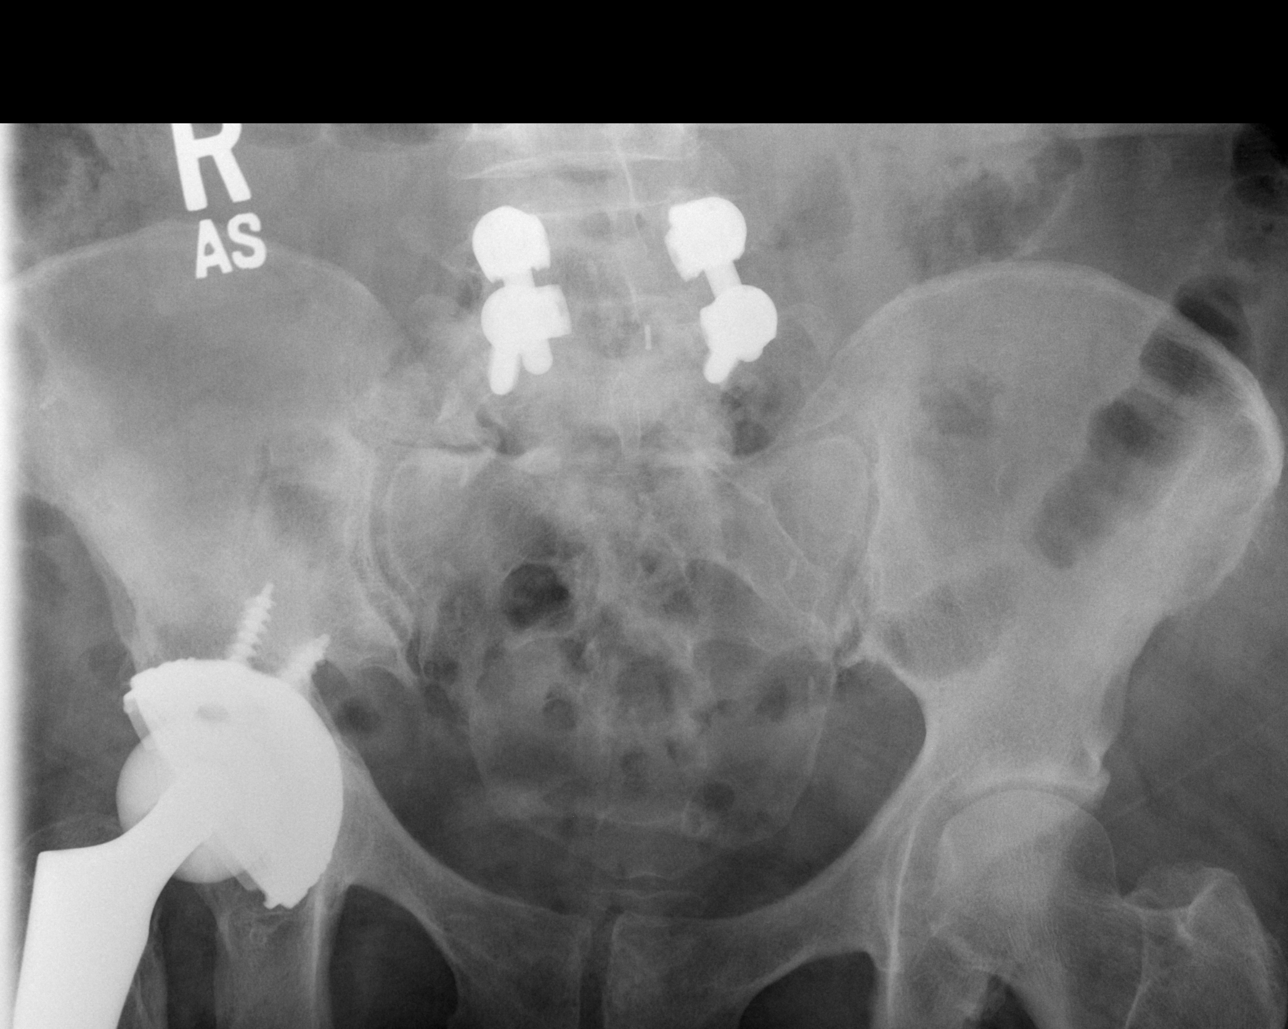

[t coccyx a.p.]
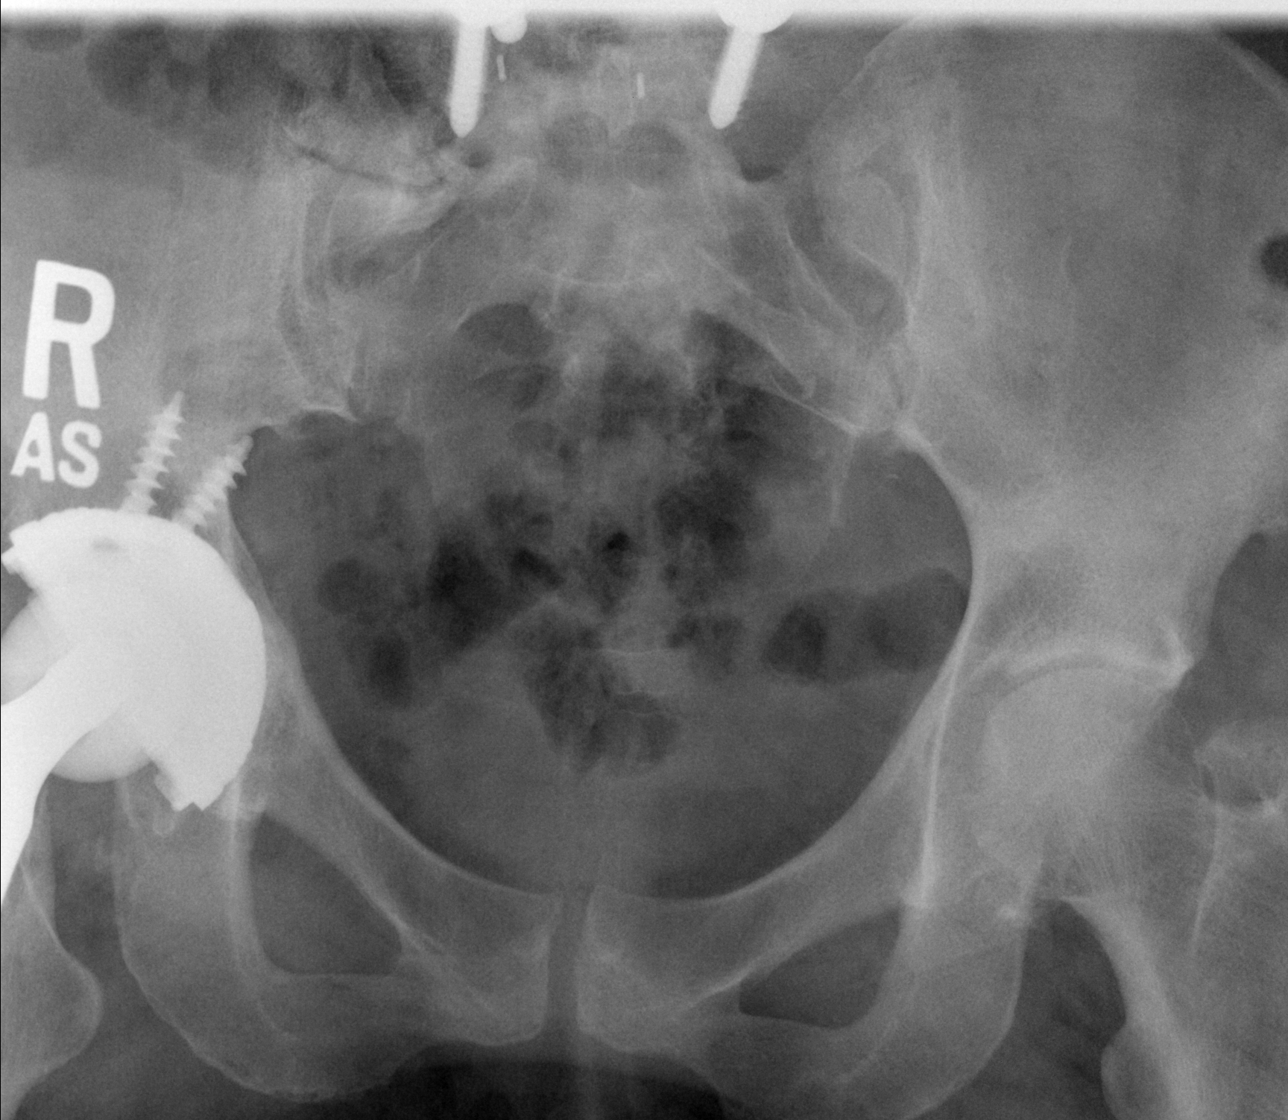

[t coccyx lat]
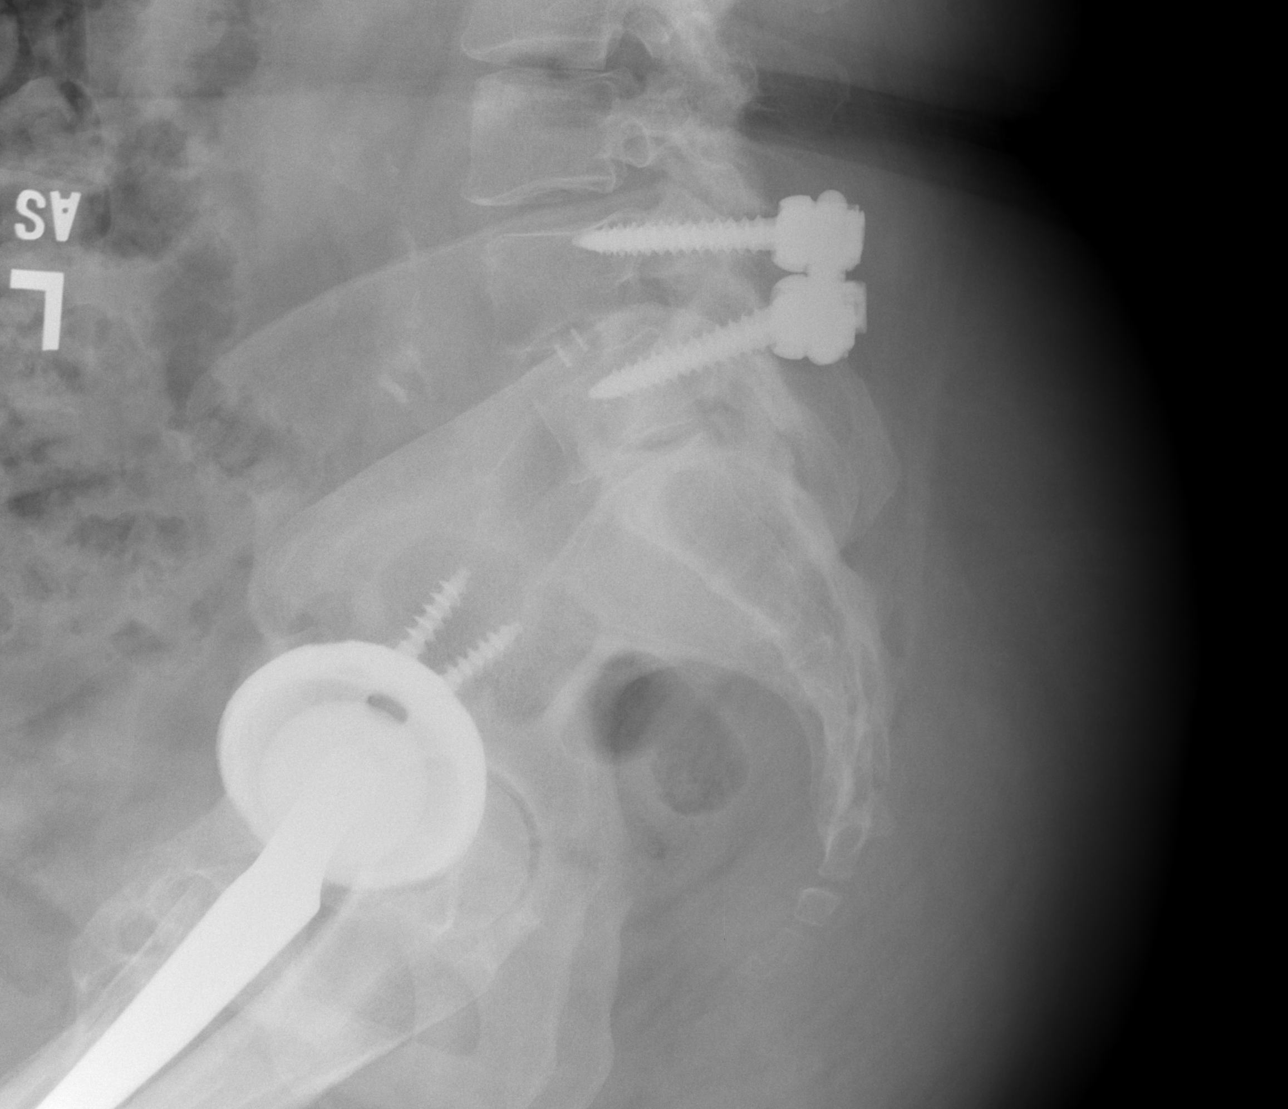

[3 of 3 positions shown; findings below may reference images not displayed]

FINDINGS: As noted previously. There has been diskectomy, decompression and
fusion at L4-5. L5 is transitional with a transitional articulation
on the right. These transitional articulations can't be painful. The
sacrum itself otherwise appears unremarkable. No sacrococcygeal
fracture. Sacroiliac joints show mild osteoarthritis..
IMPRESSION: Lower lumbar fusion. Transitional anatomy at L5-S1 with an
articulation on the right that could be a source of pain. Some
sacroiliac osteoarthritis as well.

## 2015-07-15 DIAGNOSIS — I10 Essential (primary) hypertension: Secondary | ICD-10-CM | POA: Diagnosis not present

## 2015-07-15 DIAGNOSIS — R0600 Dyspnea, unspecified: Secondary | ICD-10-CM | POA: Diagnosis not present

## 2015-07-15 DIAGNOSIS — R0602 Shortness of breath: Secondary | ICD-10-CM | POA: Diagnosis not present

## 2015-07-15 DIAGNOSIS — E785 Hyperlipidemia, unspecified: Secondary | ICD-10-CM | POA: Diagnosis not present

## 2015-08-05 ENCOUNTER — Ambulatory Visit: Payer: Medicare Other | Admitting: Orthopedic Surgery

## 2015-10-07 DIAGNOSIS — I10 Essential (primary) hypertension: Secondary | ICD-10-CM | POA: Diagnosis not present

## 2015-11-11 DIAGNOSIS — R0982 Postnasal drip: Secondary | ICD-10-CM | POA: Diagnosis not present

## 2015-11-11 DIAGNOSIS — L259 Unspecified contact dermatitis, unspecified cause: Secondary | ICD-10-CM | POA: Diagnosis not present

## 2015-11-11 DIAGNOSIS — I10 Essential (primary) hypertension: Secondary | ICD-10-CM | POA: Diagnosis not present

## 2015-12-31 ENCOUNTER — Other Ambulatory Visit: Payer: Self-pay | Admitting: Family Medicine

## 2015-12-31 DIAGNOSIS — Z1231 Encounter for screening mammogram for malignant neoplasm of breast: Secondary | ICD-10-CM

## 2016-01-06 ENCOUNTER — Ambulatory Visit
Admission: RE | Admit: 2016-01-06 | Discharge: 2016-01-06 | Disposition: A | Discharge status: Home / Self Care | Payer: Medicare Other | Source: Ambulatory Visit | Attending: Family Medicine | Admitting: Family Medicine

## 2016-01-06 DIAGNOSIS — Z1231 Encounter for screening mammogram for malignant neoplasm of breast: Secondary | ICD-10-CM | POA: Diagnosis not present

## 2016-01-09 ENCOUNTER — Other Ambulatory Visit: Payer: Self-pay | Admitting: Family Medicine

## 2016-01-09 DIAGNOSIS — R928 Other abnormal and inconclusive findings on diagnostic imaging of breast: Secondary | ICD-10-CM

## 2016-01-27 ENCOUNTER — Ambulatory Visit
Admission: RE | Admit: 2016-01-27 | Discharge: 2016-01-27 | Disposition: A | Discharge status: Home / Self Care | Payer: Medicare Other | Source: Ambulatory Visit | Attending: Family Medicine | Admitting: Family Medicine

## 2016-01-27 DIAGNOSIS — R921 Mammographic calcification found on diagnostic imaging of breast: Secondary | ICD-10-CM | POA: Diagnosis not present

## 2016-01-27 DIAGNOSIS — R928 Other abnormal and inconclusive findings on diagnostic imaging of breast: Secondary | ICD-10-CM

## 2016-02-10 DIAGNOSIS — I10 Essential (primary) hypertension: Secondary | ICD-10-CM | POA: Diagnosis not present

## 2016-02-10 DIAGNOSIS — M130 Polyarthritis, unspecified: Secondary | ICD-10-CM | POA: Diagnosis not present

## 2016-02-10 DIAGNOSIS — Z6841 Body Mass Index (BMI) 40.0 and over, adult: Secondary | ICD-10-CM | POA: Diagnosis not present

## 2016-02-23 DIAGNOSIS — Z23 Encounter for immunization: Secondary | ICD-10-CM | POA: Diagnosis not present

## 2016-02-26 ENCOUNTER — Other Ambulatory Visit: Payer: Self-pay | Admitting: Family Medicine

## 2016-02-26 DIAGNOSIS — R928 Other abnormal and inconclusive findings on diagnostic imaging of breast: Secondary | ICD-10-CM

## 2016-02-26 DIAGNOSIS — R921 Mammographic calcification found on diagnostic imaging of breast: Secondary | ICD-10-CM

## 2016-03-16 ENCOUNTER — Ambulatory Visit
Admission: RE | Admit: 2016-03-16 | Discharge: 2016-03-16 | Disposition: A | Discharge status: Home / Self Care | Payer: Medicare Other | Source: Ambulatory Visit | Attending: Family Medicine | Admitting: Family Medicine

## 2016-03-16 DIAGNOSIS — D242 Benign neoplasm of left breast: Secondary | ICD-10-CM | POA: Diagnosis not present

## 2016-03-16 DIAGNOSIS — R928 Other abnormal and inconclusive findings on diagnostic imaging of breast: Secondary | ICD-10-CM

## 2016-03-16 DIAGNOSIS — R921 Mammographic calcification found on diagnostic imaging of breast: Secondary | ICD-10-CM | POA: Diagnosis not present

## 2016-05-18 DIAGNOSIS — E669 Obesity, unspecified: Secondary | ICD-10-CM | POA: Diagnosis not present

## 2016-05-18 DIAGNOSIS — I10 Essential (primary) hypertension: Secondary | ICD-10-CM | POA: Diagnosis not present

## 2016-09-14 DIAGNOSIS — E119 Type 2 diabetes mellitus without complications: Secondary | ICD-10-CM | POA: Diagnosis not present

## 2016-09-14 DIAGNOSIS — E785 Hyperlipidemia, unspecified: Secondary | ICD-10-CM | POA: Diagnosis not present

## 2016-09-14 DIAGNOSIS — I10 Essential (primary) hypertension: Secondary | ICD-10-CM | POA: Diagnosis not present

## 2016-09-14 DIAGNOSIS — M130 Polyarthritis, unspecified: Secondary | ICD-10-CM | POA: Diagnosis not present

## 2016-11-02 ENCOUNTER — Other Ambulatory Visit: Payer: Self-pay

## 2016-12-21 DIAGNOSIS — E785 Hyperlipidemia, unspecified: Secondary | ICD-10-CM | POA: Diagnosis not present

## 2016-12-21 DIAGNOSIS — I10 Essential (primary) hypertension: Secondary | ICD-10-CM | POA: Diagnosis not present

## 2016-12-21 DIAGNOSIS — M159 Polyosteoarthritis, unspecified: Secondary | ICD-10-CM | POA: Diagnosis not present

## 2016-12-22 ENCOUNTER — Telehealth: Payer: Self-pay | Admitting: Gastroenterology

## 2016-12-22 NOTE — Telephone Encounter (Signed)
CALL HOME PHONE TO TRIAGE PT FOR SCREENING TCS IN OCT 2018 @1000 . NEEDS SUPREP OR LOW VOLUME PREP.-PCP: DR. Berneta Sages [place].

## 2016-12-23 NOTE — Telephone Encounter (Signed)
LMOM to call.

## 2016-12-28 ENCOUNTER — Telehealth: Payer: Self-pay

## 2016-12-28 NOTE — Telephone Encounter (Signed)
See separate triage.  

## 2016-12-30 NOTE — Telephone Encounter (Signed)
Gastroenterology Pre-Procedure Review  Request Date: 12/28/2016 Requesting Physician: Dr. Iona Beard  PATIENT REVIEW QUESTIONS: The patient responded to the following health history questions as indicated:    1. Diabetes Melitis: no 2. Joint replacements in the past 12 months: no 3. Major health problems in the past 3 months: no 4. Has an artificial valve or MVP: no 5. Has a defibrillator: no 6. Has been advised in past to take antibiotics in advance of a procedure like teeth cleaning: yes, when he had hip replacement 15 years ago 7. Family history of colon cancer: no  8. Alcohol Use: no 9. History of sleep apnea: no  10. History of coronary artery or other vascular stents placed within the last 12 months: no 11. History of any prior anesthesia complications: no    MEDICATIONS & ALLERGIES:    Patient reports the following regarding taking any blood thinners:   Plavix? no Aspirin? YES Coumadin? no Brilinta? no Xarelto? no Eliquis? no Pradaxa? no Savaysa? no Effient? no  Patient confirms/reports the following medications:  Current Outpatient Prescriptions  Medication Sig Dispense Refill  . allopurinol (ZYLOPRIM) 100 MG tablet Take 100 mg by mouth daily.    Marland Kitchen aspirin 81 MG tablet Take 81 mg by mouth daily.    . cloNIDine (CATAPRES) 0.1 MG tablet Take 0.1 mg by mouth daily.    Marland Kitchen gemfibrozil (LOPID) 600 MG tablet Take 600 mg by mouth daily.     . hydrochlorothiazide (HYDRODIURIL) 25 MG tablet Take 25 mg by mouth daily.    . metoprolol (LOPRESSOR) 50 MG tablet Take 50 mg by mouth daily.      No current facility-administered medications for this visit.     Patient confirms/reports the following allergies:  Allergies  Allergen Reactions  . Ace Inhibitors   . Advil [Ibuprofen]   . Penicillins     No orders of the defined types were placed in this encounter.   AUTHORIZATION INFORMATION Primary Insurance:  ID #:  Group #:  Pre-Cert / Auth required:  Pre-Cert / Auth #:    Secondary Insurance:   ID #:   Group #:  Pre-Cert / Auth required: Pre-Cert / Auth #:   SCHEDULE INFORMATION: Procedure has been scheduled as follows:  Date:  02/18/2017           Time:  8:30 AM Location: [facility] Short Stay  This Gastroenterology Pre-Precedure Review Form is being routed to the following provider(s): Barney Drain, MD

## 2016-12-30 NOTE — Telephone Encounter (Signed)
Appropriate.

## 2017-01-03 ENCOUNTER — Other Ambulatory Visit: Payer: Self-pay

## 2017-01-03 DIAGNOSIS — Z1211 Encounter for screening for malignant neoplasm of colon: Secondary | ICD-10-CM

## 2017-01-03 MED ORDER — PEG 3350-KCL-NA BICARB-NACL 420 G PO SOLR: 4000.0000 mL | ORAL | 0 refills | Status: DC

## 2017-01-03 NOTE — Telephone Encounter (Signed)
Rx sent to the pharmacy and instructions mailed to pt.  

## 2017-01-04 DIAGNOSIS — E785 Hyperlipidemia, unspecified: Secondary | ICD-10-CM | POA: Diagnosis not present

## 2017-01-04 DIAGNOSIS — Z23 Encounter for immunization: Secondary | ICD-10-CM | POA: Diagnosis not present

## 2017-01-18 DIAGNOSIS — Z1231 Encounter for screening mammogram for malignant neoplasm of breast: Secondary | ICD-10-CM | POA: Diagnosis not present

## 2017-01-24 ENCOUNTER — Telehealth: Payer: Self-pay | Admitting: General Practice

## 2017-01-24 NOTE — Telephone Encounter (Signed)
I called the patient to reschedule her tcs from 11/9 to 11/12, no answer, lmom

## 2017-01-25 ENCOUNTER — Encounter: Payer: Self-pay | Admitting: *Deleted

## 2017-01-25 NOTE — Telephone Encounter (Signed)
Called spoke with patient and made aware need to r/s appointment from 11/9 to 11/12 at 2:45pm. She is going to check her place of employment to ensure this date is okay. I advised her I was going to send her new instructions to her home address since the time/date has changed. She verbalized understanding. If she is unable to do this date she will call back to let us know but go ahead and leave this for now. New instructions have been mailed to her

## 2017-01-25 NOTE — Telephone Encounter (Signed)
Routing to Mindy.  

## 2017-02-17 ENCOUNTER — Telehealth: Payer: Self-pay

## 2017-02-17 NOTE — Telephone Encounter (Signed)
Tried to call pt to see if she can arrive earlier 02/21/17 for TCS, no answer, LMOAM for her to call office.

## 2017-02-21 ENCOUNTER — Encounter (HOSPITAL_COMMUNITY)
Admission: RE | Disposition: A | Discharge status: Home / Self Care | Payer: Self-pay | Source: Ambulatory Visit | Attending: Gastroenterology

## 2017-02-21 ENCOUNTER — Encounter (HOSPITAL_COMMUNITY): Payer: Self-pay | Admitting: Gastroenterology

## 2017-02-21 ENCOUNTER — Other Ambulatory Visit: Payer: Self-pay

## 2017-02-21 ENCOUNTER — Ambulatory Visit (HOSPITAL_COMMUNITY)
Admission: RE | Admit: 2017-02-21 | Discharge: 2017-02-21 | Disposition: A | Discharge status: Home / Self Care | Payer: Medicare Other | Source: Ambulatory Visit | Attending: Gastroenterology | Admitting: Gastroenterology

## 2017-02-21 DIAGNOSIS — Z87891 Personal history of nicotine dependence: Secondary | ICD-10-CM | POA: Diagnosis not present

## 2017-02-21 DIAGNOSIS — Z1211 Encounter for screening for malignant neoplasm of colon: Secondary | ICD-10-CM | POA: Diagnosis not present

## 2017-02-21 DIAGNOSIS — Z7982 Long term (current) use of aspirin: Secondary | ICD-10-CM | POA: Diagnosis not present

## 2017-02-21 DIAGNOSIS — I10 Essential (primary) hypertension: Secondary | ICD-10-CM | POA: Diagnosis not present

## 2017-02-21 DIAGNOSIS — E7800 Pure hypercholesterolemia, unspecified: Secondary | ICD-10-CM | POA: Insufficient documentation

## 2017-02-21 DIAGNOSIS — M1990 Unspecified osteoarthritis, unspecified site: Secondary | ICD-10-CM | POA: Insufficient documentation

## 2017-02-21 DIAGNOSIS — Z888 Allergy status to other drugs, medicaments and biological substances status: Secondary | ICD-10-CM | POA: Diagnosis not present

## 2017-02-21 DIAGNOSIS — D124 Benign neoplasm of descending colon: Secondary | ICD-10-CM | POA: Insufficient documentation

## 2017-02-21 DIAGNOSIS — Z880 Allergy status to penicillin: Secondary | ICD-10-CM | POA: Insufficient documentation

## 2017-02-21 DIAGNOSIS — D214 Benign neoplasm of connective and other soft tissue of abdomen: Secondary | ICD-10-CM | POA: Diagnosis not present

## 2017-02-21 DIAGNOSIS — K219 Gastro-esophageal reflux disease without esophagitis: Secondary | ICD-10-CM | POA: Insufficient documentation

## 2017-02-21 DIAGNOSIS — Z886 Allergy status to analgesic agent status: Secondary | ICD-10-CM | POA: Diagnosis not present

## 2017-02-21 DIAGNOSIS — K644 Residual hemorrhoidal skin tags: Secondary | ICD-10-CM | POA: Insufficient documentation

## 2017-02-21 DIAGNOSIS — Z79899 Other long term (current) drug therapy: Secondary | ICD-10-CM | POA: Insufficient documentation

## 2017-02-21 DIAGNOSIS — K648 Other hemorrhoids: Secondary | ICD-10-CM | POA: Insufficient documentation

## 2017-02-21 HISTORY — DX: Unspecified osteoarthritis, unspecified site: M19.90

## 2017-02-21 HISTORY — PX: COLONOSCOPY: SHX5424

## 2017-02-21 SURGERY — COLONOSCOPY
Anesthesia: Moderate Sedation

## 2017-02-21 MED ORDER — MEPERIDINE HCL 100 MG/ML IJ SOLN
INTRAMUSCULAR | Status: AC
  Filled 2017-02-21: qty 2

## 2017-02-21 MED ORDER — MIDAZOLAM HCL 5 MG/5ML IJ SOLN
INTRAMUSCULAR | Status: DC | PRN
  Administered 2017-02-21 (×2): 2 mg via INTRAVENOUS

## 2017-02-21 MED ORDER — MEPERIDINE HCL 100 MG/ML IJ SOLN
INTRAMUSCULAR | Status: DC | PRN
  Administered 2017-02-21: 25 mg via INTRAVENOUS
  Administered 2017-02-21: 50 mg via INTRAVENOUS

## 2017-02-21 MED ORDER — MIDAZOLAM HCL 5 MG/5ML IJ SOLN
INTRAMUSCULAR | Status: AC
  Filled 2017-02-21: qty 10

## 2017-02-21 MED ORDER — SODIUM CHLORIDE 0.9 % IV SOLN
INTRAVENOUS | Status: DC
  Administered 2017-02-21: 14:00:00 via INTRAVENOUS

## 2017-02-21 NOTE — Op Note (Signed)
[facility] Patient Name: Jennifer Bennett Procedure Date: 02/21/2017 1:24 PM MRN: [id number] Date of Birth: 29-Jan-1946 Attending MD: Barney Drain MD, MD CSN: [id number] Age: 71 Admit Type: Outpatient Procedure:                Colonoscopy with COLD SNARE POLYPECTOMY Indications:              Screening for colorectal malignant neoplasm Providers:                Barney Drain MD, MD, Janeece Riggers, RN, Aram Candela Referring MD:             Barrie Folk. Hill MD, MD Medicines:                Meperidine 75 mg IV, Midazolam 4 mg IV Complications:            No immediate complications. Estimated Blood Loss:     Estimated blood loss was minimal. Procedure:                Pre-Anesthesia Assessment:                           - Prior to the procedure, a History and Physical                            was performed, and patient medications and                            allergies were reviewed. The patient's tolerance of                            previous anesthesia was also reviewed. The risks                            and benefits of the procedure and the sedation                            options and risks were discussed with the patient.                            All questions were answered, and informed consent                            was obtained. Prior Anticoagulants: The patient has                            taken aspirin, last dose was day of procedure. ASA                            Grade Assessment: II - A patient with mild systemic                            disease. After reviewing the risks and benefits,                            the patient was deemed in satisfactory condition to  undergo the procedure. After obtaining informed                            consent, the colonoscope was passed under direct                            vision. Throughout the procedure, the patient's                            blood pressure, pulse, and oxygen saturations  were                            monitored continuously. The [id number]) was                            introduced through the anus and advanced to the the                            cecum, identified by appendiceal orifice and                            ileocecal valve. The colonoscopy was somewhat                            difficult due to a tortuous colon. Successful                            completion of the procedure was aided by COLOWRAP.                            The patient tolerated the procedure well. The                            quality of the bowel preparation was good. The                            ileocecal valve, appendiceal orifice, and rectum                            were photographed. Scope In: 2:45:31 PM Scope Out: 3:03:09 PM Scope Withdrawal Time: 0 hours 15 minutes 23 seconds  Total Procedure Duration: 0 hours 17 minutes 38 seconds  Findings:      A 5 mm polyp was found in the proximal descending colon. The polyp was       sessile. The polyp was removed with a cold snare. Resection and       retrieval were complete.      Multiple small and large-mouthed diverticula were found in the       recto-sigmoid colon and sigmoid colon.      External and internal hemorrhoids were found during retroflexion. The       hemorrhoids were moderate. Impression:               - One 5 mm polyp in the proximal descending colon,  removed with a cold snare. Resected and retrieved.                           - Diverticulosis in the recto-sigmoid colon and in                            the sigmoid colon.                           - External and internal hemorrhoids. Moderate Sedation:      Moderate (conscious) sedation was administered by the endoscopy nurse       and supervised by the endoscopist. The following parameters were       monitored: oxygen saturation, heart rate, blood pressure, and response       to care. Total physician intraservice  time was 34 minutes. Recommendation:           - High fiber diet.                           - Continue present medications.                           - Await pathology results.                           - Patient has a contact number available for                            emergencies. The signs and symptoms of potential                            delayed complications were discussed with the                            patient. Return to normal activities tomorrow.                            Written discharge instructions were provided to the                            patient.                           - No repeat colonoscopy due to age. Procedure Code(s):        --- Professional ---                           [phone], Colonoscopy, flexible; with removal of                            tumor(s), polyp(s), or other lesion(s) by snare                            technique  [id number], Moderate sedation services provided by the                            same physician or other qualified health care                            professional performing the diagnostic or                            therapeutic service that the sedation supports,                            requiring the presence of an independent trained                            observer to assist in the monitoring of the                            patient's level of consciousness and physiological                            status; initial 15 minutes of intraservice time,                            patient age 36 years or older                           [phone], Moderate sedation services; each additional                            15 minutes intraservice time Diagnosis Code(s):        --- Professional ---                           Z12.11, Encounter for screening for malignant                            neoplasm of colon                           D12.4, Benign neoplasm of descending colon                            K64.8, Other hemorrhoids                           K57.30, Diverticulosis of large intestine without                            perforation or abscess without bleeding CPT copyright 2016 [employer]. All rights reserved. The codes documented in this report are preliminary and upon coder review may  be revised to meet current compliance requirements. Barney Drain, MD Barney Drain MD, MD 02/21/2017 3:14:58 PM This report has been signed electronically. Number of Addenda: 0

## 2017-02-21 NOTE — Discharge Instructions (Addendum)
You have moderate internal AND EXTERNAL hemorrhoids. YOU HAVE diverticulosis IN YOUR LEFT COLON. YOU HAD ONE SMALL POLYP REMOVED.    DRINK WATER TO KEEP YOUR URINE LIGHT YELLOW.  FOLLOW A HIGH FIBER DIET. AVOID ITEMS THAT CAUSE BLOATING. See info below.  USE PREPARATION H FOUR TIMES  A DAY IF NEEDED TO RELIEVE RECTAL PAIN/PRESSURE/BLEEDING.   YOUR BIOPSY RESULTS WILL BE AVAILABLE IN MY CHART  NOV 16 AND MY OFFICE WILL CONTACT YOU IN 10-14 DAYS WITH YOUR RESULTS.   Next colonoscopy in 5-10 years IF THE BENEFITS OUTWEIGH THE RISKS.  Colonoscopy Care After Read the instructions outlined below and refer to this sheet in the next week. These discharge instructions provide you with general information on caring for yourself after you leave the hospital. While your treatment has been planned according to the most current medical practices available, unavoidable complications occasionally occur. If you have any problems or questions after discharge, call DR. FIELDS, [phone].  ACTIVITY  You may resume your regular activity, but move at a slower pace for the next 24 hours.   Take frequent rest periods for the next 24 hours.   Walking will help get rid of the air and reduce the bloated feeling in your belly (abdomen).   No driving for 24 hours (because of the medicine (anesthesia) used during the test).   You may shower.   Do not sign any important legal documents or operate any machinery for 24 hours (because of the anesthesia used during the test).    NUTRITION  Drink plenty of fluids.   You may resume your normal diet as instructed by your doctor.   Begin with a light meal and progress to your normal diet. Heavy or fried foods are harder to digest and may make you feel sick to your stomach (nauseated).   Avoid alcoholic beverages for 24 hours or as instructed.    MEDICATIONS  You may resume your normal medications.   WHAT YOU CAN EXPECT TODAY  Some feelings of bloating  in the abdomen.   Passage of more gas than usual.   Spotting of blood in your stool or on the toilet paper  .  IF YOU HAD POLYPS REMOVED DURING THE COLONOSCOPY:  Eat a soft diet IF YOU HAVE NAUSEA, BLOATING, ABDOMINAL PAIN, OR VOMITING.    FINDING OUT THE RESULTS OF YOUR TEST Not all test results are available during your visit. DR. Oneida Alar WILL CALL YOU WITHIN 14 DAYS OF YOUR PROCEDUE WITH YOUR RESULTS. Do not assume everything is normal if you have not heard from DR. FIELDS, CALL HER OFFICE AT [phone].  SEEK IMMEDIATE MEDICAL ATTENTION AND CALL THE OFFICE: [phone] IF:  You have more than a spotting of blood in your stool.   Your belly is swollen (abdominal distention).   You are nauseated or vomiting.   You have a temperature over 101F.   You have abdominal pain or discomfort that is severe or gets worse throughout the day.  High-Fiber Diet A high-fiber diet changes your normal diet to include more whole grains, legumes, fruits, and vegetables. Changes in the diet involve replacing refined carbohydrates with unrefined foods. The calorie level of the diet is essentially unchanged. The Dietary Reference Intake (recommended amount) for adult males is 38 grams per day. For adult females, it is 25 grams per day. Pregnant and lactating women should consume 28 grams of fiber per day. Fiber is the intact part of a plant that is not broken down during  digestion. Functional fiber is fiber that has been isolated from the plant to provide a beneficial effect in the body. PURPOSE  Increase stool bulk.   Ease and regulate bowel movements.   Lower cholesterol.  REDUCE RISK OF COLON CANCER  INDICATIONS THAT YOU NEED MORE FIBER  Constipation and hemorrhoids.   Uncomplicated diverticulosis (intestine condition) and irritable bowel syndrome.   Weight management.   As a protective measure against hardening of the arteries (atherosclerosis), diabetes, and cancer.   GUIDELINES  FOR INCREASING FIBER IN THE DIET  Start adding fiber to the diet slowly. A gradual increase of about 5 more grams (2 slices of whole-wheat bread, 2 servings of most fruits or vegetables, or 1 bowl of high-fiber cereal) per day is best. Too rapid an increase in fiber may result in constipation, flatulence, and bloating.   Drink enough water and fluids to keep your urine clear or pale yellow. Water, juice, or caffeine-free drinks are recommended. Not drinking enough fluid may cause constipation.   Eat a variety of high-fiber foods rather than one type of fiber.   Try to increase your intake of fiber through using high-fiber foods rather than fiber pills or supplements that contain small amounts of fiber.   The goal is to change the types of food eaten. Do not supplement your present diet with high-fiber foods, but replace foods in your present diet.   INCLUDE A VARIETY OF FIBER SOURCES  Replace refined and processed grains with whole grains, canned fruits with fresh fruits, and incorporate other fiber sources. White rice, white breads, and most bakery goods contain little or no fiber.   Brown whole-grain rice, buckwheat oats, and many fruits and vegetables are all good sources of fiber. These include: broccoli, Brussels sprouts, cabbage, cauliflower, beets, sweet potatoes, white potatoes (skin on), carrots, tomatoes, eggplant, squash, berries, fresh fruits, and dried fruits.   Cereals appear to be the richest source of fiber. Cereal fiber is found in whole grains and bran. Bran is the fiber-rich outer coat of cereal grain, which is largely removed in refining. In whole-grain cereals, the bran remains. In breakfast cereals, the largest amount of fiber is found in those with "bran" in their names. The fiber content is sometimes indicated on the label.   You may need to include additional fruits and vegetables each day.   In baking, for 1 cup white flour, you may use the following substitutions:    1 cup whole-wheat flour minus 2 tablespoons.   1/2 cup white flour plus 1/2 cup whole-wheat flour.   Polyps, Colon  A polyp is extra tissue that grows inside your body. Colon polyps grow in the large intestine. The large intestine, also called the colon, is part of your digestive system. It is a long, hollow tube at the end of your digestive tract where your body makes and stores stool. Most polyps are not dangerous. They are benign. This means they are not cancerous. But over time, some types of polyps can turn into cancer. Polyps that are smaller than a pea are usually not harmful. But larger polyps could someday become or may already be cancerous. To be safe, doctors remove all polyps and test them.   PREVENTION There is not one sure way to prevent polyps. You might be able to lower your risk of getting them if you:  Eat more fruits and vegetables and less fatty food.   Do not smoke.   Avoid alcohol.   Exercise every day.  Lose weight if you are overweight.   Eating more calcium and folate can also lower your risk of getting polyps. Some foods that are rich in calcium are milk, cheese, and broccoli. Some foods that are rich in folate are chickpeas, kidney beans, and spinach.    Diverticulosis Diverticulosis is a common condition that develops when small pouches (diverticula) form in the wall of the colon. The risk of diverticulosis increases with age. It happens more often in people who eat a low-fiber diet. Most individuals with diverticulosis have no symptoms. Those individuals with symptoms usually experience belly (abdominal) pain, constipation, or loose stools (diarrhea).  HOME CARE INSTRUCTIONS  Increase the amount of fiber in your diet as directed by your caregiver or dietician. This may reduce symptoms of diverticulosis.   Drink at least 6 to 8 glasses of water each day to prevent constipation.   Try not to strain when you have a bowel movement.   Avoiding nuts and  seeds to prevent complications is NOT NECESSARY.     FOODS HAVING HIGH FIBER CONTENT INCLUDE:  Fruits. Apple, peach, pear, tangerine, raisins, prunes.   Vegetables. Brussels sprouts, asparagus, broccoli, cabbage, carrot, cauliflower, romaine lettuce, spinach, summer squash, tomato, winter squash, zucchini.   Starchy Vegetables. Baked beans, kidney beans, lima beans, split peas, lentils, potatoes (with skin).   Grains. Whole wheat bread, brown rice, bran flake cereal, plain oatmeal, white rice, shredded wheat, bran muffins.    SEEK IMMEDIATE MEDICAL CARE IF:  You develop increasing pain or severe bloating.   You have an oral temperature above 101F.   You develop vomiting or bowel movements that are bloody or black.   Hemorrhoids Hemorrhoids are dilated (enlarged) veins around the rectum. Sometimes clots will form in the veins. This makes them swollen and painful. These are called thrombosed hemorrhoids. Causes of hemorrhoids include:  Constipation.   Straining to have a bowel movement.   HEAVY LIFTING  HOME CARE INSTRUCTIONS  Eat a well balanced diet and drink 6 to 8 glasses of water every day to avoid constipation. You may also use a bulk laxative.   Avoid straining to have bowel movements.   Keep anal area dry and clean.   Do not use a donut shaped pillow or sit on the toilet for long periods. This increases blood pooling and pain.   Move your bowels when your body has the urge; this will require less straining and will decrease pain and pressure.

## 2017-02-21 NOTE — H&P (Signed)
Primary Care Physician:  Iona Beard, MD Primary Gastroenterologist:  Dr. Oneida Alar  Pre-Procedure History & Physical: HPI:  Jennifer Bennett is a 71 y.o. female here for [place].  Past Medical History:  Diagnosis Date  . Acid reflux   . Arthritis   . High blood pressure   . High cholesterol     Past Surgical History:  Procedure Laterality Date  . BACK SURGERY    . CARPAL TUNNEL RELEASE    . HIP SURGERY    . VESICOVAGINAL FISTULA CLOSURE W/ TAH      Prior to Admission medications   Medication Sig Start Date End Date Taking? Authorizing Provider  acetaminophen (TYLENOL 8 HOUR ARTHRITIS PAIN) 650 MG CR tablet Take 650-1,300 mg every 8 (eight) hours as needed by mouth for pain.   Yes [provider]  allopurinol (ZYLOPRIM) 100 MG tablet Take 100 mg by mouth daily.   Yes [provider]  aspirin EC 81 MG tablet Take 81 mg daily by mouth.   Yes [provider]  cloNIDine (CATAPRES) 0.1 MG tablet Take 0.1 mg by mouth daily.   Yes [provider]  gemfibrozil (LOPID) 600 MG tablet Take 600 mg 2 (two) times daily by mouth.    Yes [provider]  hydrochlorothiazide (HYDRODIURIL) 25 MG tablet Take 25 mg by mouth daily.   Yes [provider]  HYDROcodone-acetaminophen (NORCO/VICODIN) 5-325 MG tablet Take 1 tablet daily as needed by mouth. For pain. 12/31/16  Yes [provider]  metoprolol (LOPRESSOR) 50 MG tablet Take 50 mg by mouth daily.    Yes [provider]  polyethylene glycol-electrolytes (TRILYTE) 420 g solution Take 4,000 mLs by mouth as directed. 01/03/17  Yes Shalin Linders, Marga Melnick, MD  Tetrahyd-Glyc-Hypro-PEG-ZnSulf (VISINE TOTALITY MULTI-SYMPTOM OP) Apply 1 drop 3 (three) times daily as needed to eye (for dry/irritated eyes.).   Yes [provider]  Aspirin-Salicylamide-Caffeine (BC FAST PAIN RELIEF) 650-195-33.3 MG PACK Take 1 packet daily as needed by mouth (for pain.).    [provider]    Allergies as of 01/03/2017 - Review Complete 12/28/2016  Allergen Reaction Noted  . Ace inhibitors    . Advil [ibuprofen]  07/24/2013  . Penicillins  07/24/2013    Family History  Problem Relation Age of Onset  . Cancer Unknown   . Arthritis Unknown   . Asthma Unknown   . Heart disease Father   . Cancer Brother     Social History   Socioeconomic History  . Marital status: Widowed    Spouse name: Not on file  . Number of children: Not on file  . Years of education: Not on file  . Highest education level: Not on file  Social Needs  . Financial resource strain: Not on file  . Food insecurity - worry: Not on file  . Food insecurity - inability: Not on file  . Transportation needs - medical: Not on file  . Transportation needs - non-medical: Not on file  Occupational History  . Not on file  Tobacco Use  . Smoking status: Former Smoker    Packs/day: 0.50    Years: 52.00    Pack years: 26.00    Types: Cigarettes    Last attempt to quit: 06/25/2014    Years since quitting: 2.6  . Smokeless tobacco: Never Used  Substance and Sexual Activity  . Alcohol use: No  . Drug use: No  . Sexual activity: Not on file  Other Topics Concern  .  Not on file  Social History Narrative  . Not on file    Review of Systems: See HPI, otherwise negative ROS   Physical Exam: BP 133/79   Pulse 65   Temp 98.3 F (36.8 C) (Oral)   Resp 19   Wt 240 lb (108.9 kg)   SpO2 94%   BMI 45.35 kg/m  General:   Alert,  pleasant and cooperative in NAD Head:  Normocephalic and atraumatic. Neck:  Supple; Lungs:  Clear throughout to auscultation.    Heart:  Regular rate and rhythm. Abdomen:  Soft, nontender and nondistended. Normal bowel sounds, without guarding, and without rebound.   Neurologic:  Alert and  oriented x4;  grossly normal neurologically.  Impression/Plan:     SCREENING  Plan:  1. TCS TODAY DISCUSSED PROCEDURE, BENEFITS, & RISKS: < 1% chance of  medication reaction, bleeding, perforation, or rupture of spleen/liv

## 2017-02-25 ENCOUNTER — Telehealth: Payer: Self-pay | Admitting: Gastroenterology

## 2017-02-25 NOTE — Telephone Encounter (Signed)
Called patient TO DISCUSS RESULTS. LVM TO CALL [phone] TO DISCUSS.  She had a polypoid lesion, removed and it was benign. FOLLOW A HIGH FIBER DIET.  NEXT TCS in 10-15 years IF THE BENEFITS OUTWEIGH THE RISKS.

## 2017-02-28 ENCOUNTER — Encounter (HOSPITAL_COMMUNITY): Payer: Self-pay | Admitting: Gastroenterology

## 2017-03-01 NOTE — Telephone Encounter (Signed)
Reminder in epic °

## 2017-04-19 DIAGNOSIS — J209 Acute bronchitis, unspecified: Secondary | ICD-10-CM | POA: Diagnosis not present

## 2017-04-19 DIAGNOSIS — E669 Obesity, unspecified: Secondary | ICD-10-CM | POA: Diagnosis not present

## 2017-04-19 DIAGNOSIS — I10 Essential (primary) hypertension: Secondary | ICD-10-CM | POA: Diagnosis not present

## 2017-05-03 DIAGNOSIS — I10 Essential (primary) hypertension: Secondary | ICD-10-CM | POA: Diagnosis not present

## 2017-05-03 DIAGNOSIS — E785 Hyperlipidemia, unspecified: Secondary | ICD-10-CM | POA: Diagnosis not present

## 2017-05-03 DIAGNOSIS — N771 Vaginitis, vulvitis and vulvovaginitis in diseases classified elsewhere: Secondary | ICD-10-CM | POA: Diagnosis not present

## 2017-05-03 DIAGNOSIS — E669 Obesity, unspecified: Secondary | ICD-10-CM | POA: Diagnosis not present

## 2017-05-24 DIAGNOSIS — E669 Obesity, unspecified: Secondary | ICD-10-CM | POA: Diagnosis not present

## 2017-05-24 DIAGNOSIS — I10 Essential (primary) hypertension: Secondary | ICD-10-CM | POA: Diagnosis not present

## 2017-06-07 DIAGNOSIS — K589 Irritable bowel syndrome without diarrhea: Secondary | ICD-10-CM | POA: Diagnosis not present

## 2017-07-19 DIAGNOSIS — E669 Obesity, unspecified: Secondary | ICD-10-CM | POA: Diagnosis not present

## 2017-07-19 DIAGNOSIS — M1990 Unspecified osteoarthritis, unspecified site: Secondary | ICD-10-CM | POA: Diagnosis not present

## 2017-07-19 DIAGNOSIS — I10 Essential (primary) hypertension: Secondary | ICD-10-CM | POA: Diagnosis not present

## 2017-07-19 DIAGNOSIS — K589 Irritable bowel syndrome without diarrhea: Secondary | ICD-10-CM | POA: Diagnosis not present

## 2017-10-18 DIAGNOSIS — E782 Mixed hyperlipidemia: Secondary | ICD-10-CM | POA: Diagnosis not present

## 2017-10-18 DIAGNOSIS — I10 Essential (primary) hypertension: Secondary | ICD-10-CM | POA: Diagnosis not present

## 2017-10-18 DIAGNOSIS — M130 Polyarthritis, unspecified: Secondary | ICD-10-CM | POA: Diagnosis not present

## 2017-10-18 DIAGNOSIS — Z6841 Body Mass Index (BMI) 40.0 and over, adult: Secondary | ICD-10-CM | POA: Diagnosis not present

## 2018-02-09 DIAGNOSIS — Z23 Encounter for immunization: Secondary | ICD-10-CM | POA: Diagnosis not present

## 2018-02-21 DIAGNOSIS — R42 Dizziness and giddiness: Secondary | ICD-10-CM | POA: Diagnosis not present

## 2018-02-21 DIAGNOSIS — E782 Mixed hyperlipidemia: Secondary | ICD-10-CM | POA: Diagnosis not present

## 2018-02-21 DIAGNOSIS — I10 Essential (primary) hypertension: Secondary | ICD-10-CM | POA: Diagnosis not present

## 2018-02-21 DIAGNOSIS — Z6841 Body Mass Index (BMI) 40.0 and over, adult: Secondary | ICD-10-CM | POA: Diagnosis not present

## 2018-03-01 ENCOUNTER — Ambulatory Visit (HOSPITAL_COMMUNITY)
Admission: RE | Admit: 2018-03-01 | Discharge: 2018-03-01 | Disposition: A | Discharge status: Home / Self Care | Payer: Medicare Other | Source: Ambulatory Visit | Attending: Family Medicine | Admitting: Family Medicine

## 2018-03-01 DIAGNOSIS — R0602 Shortness of breath: Secondary | ICD-10-CM | POA: Diagnosis not present

## 2018-03-29 DIAGNOSIS — H8110 Benign paroxysmal vertigo, unspecified ear: Secondary | ICD-10-CM | POA: Diagnosis not present

## 2018-10-23 DIAGNOSIS — Z0000 Encounter for general adult medical examination without abnormal findings: Secondary | ICD-10-CM | POA: Diagnosis not present

## 2018-12-16 DIAGNOSIS — I10 Essential (primary) hypertension: Secondary | ICD-10-CM | POA: Diagnosis not present

## 2018-12-16 DIAGNOSIS — E782 Mixed hyperlipidemia: Secondary | ICD-10-CM | POA: Diagnosis not present

## 2018-12-16 DIAGNOSIS — M130 Polyarthritis, unspecified: Secondary | ICD-10-CM | POA: Diagnosis not present

## 2018-12-16 DIAGNOSIS — E785 Hyperlipidemia, unspecified: Secondary | ICD-10-CM | POA: Diagnosis not present

## 2018-12-16 DIAGNOSIS — R0602 Shortness of breath: Secondary | ICD-10-CM | POA: Diagnosis not present

## 2018-12-16 DIAGNOSIS — K589 Irritable bowel syndrome without diarrhea: Secondary | ICD-10-CM | POA: Diagnosis not present

## 2018-12-16 DIAGNOSIS — E669 Obesity, unspecified: Secondary | ICD-10-CM | POA: Diagnosis not present

## 2019-06-09 DIAGNOSIS — K589 Irritable bowel syndrome without diarrhea: Secondary | ICD-10-CM | POA: Diagnosis not present

## 2019-06-09 DIAGNOSIS — E785 Hyperlipidemia, unspecified: Secondary | ICD-10-CM | POA: Diagnosis not present

## 2019-06-09 DIAGNOSIS — R0602 Shortness of breath: Secondary | ICD-10-CM | POA: Diagnosis not present

## 2019-06-09 DIAGNOSIS — E782 Mixed hyperlipidemia: Secondary | ICD-10-CM | POA: Diagnosis not present

## 2019-06-09 DIAGNOSIS — M130 Polyarthritis, unspecified: Secondary | ICD-10-CM | POA: Diagnosis not present

## 2019-06-09 DIAGNOSIS — I10 Essential (primary) hypertension: Secondary | ICD-10-CM | POA: Diagnosis not present

## 2019-09-29 DIAGNOSIS — I10 Essential (primary) hypertension: Secondary | ICD-10-CM | POA: Diagnosis not present

## 2019-09-29 DIAGNOSIS — E785 Hyperlipidemia, unspecified: Secondary | ICD-10-CM | POA: Diagnosis not present

## 2019-09-29 DIAGNOSIS — E782 Mixed hyperlipidemia: Secondary | ICD-10-CM | POA: Diagnosis not present

## 2019-09-29 DIAGNOSIS — R0602 Shortness of breath: Secondary | ICD-10-CM | POA: Diagnosis not present

## 2019-09-29 DIAGNOSIS — K589 Irritable bowel syndrome without diarrhea: Secondary | ICD-10-CM | POA: Diagnosis not present

## 2019-09-29 DIAGNOSIS — M159 Polyosteoarthritis, unspecified: Secondary | ICD-10-CM | POA: Diagnosis not present

## 2020-01-16 ENCOUNTER — Other Ambulatory Visit: Payer: Self-pay | Admitting: Family Medicine

## 2020-01-16 DIAGNOSIS — Z1231 Encounter for screening mammogram for malignant neoplasm of breast: Secondary | ICD-10-CM

## 2020-02-02 DIAGNOSIS — M159 Polyosteoarthritis, unspecified: Secondary | ICD-10-CM | POA: Diagnosis not present

## 2020-02-02 DIAGNOSIS — E785 Hyperlipidemia, unspecified: Secondary | ICD-10-CM | POA: Diagnosis not present

## 2020-02-02 DIAGNOSIS — Z23 Encounter for immunization: Secondary | ICD-10-CM | POA: Diagnosis not present

## 2020-02-02 DIAGNOSIS — I10 Essential (primary) hypertension: Secondary | ICD-10-CM | POA: Diagnosis not present

## 2020-02-06 ENCOUNTER — Ambulatory Visit
Admission: RE | Admit: 2020-02-06 | Discharge: 2020-02-06 | Disposition: A | Discharge status: Home / Self Care | Payer: Medicare Other | Source: Ambulatory Visit | Attending: Family Medicine | Admitting: Family Medicine

## 2020-02-06 ENCOUNTER — Other Ambulatory Visit: Payer: Self-pay

## 2020-02-06 DIAGNOSIS — Z1231 Encounter for screening mammogram for malignant neoplasm of breast: Secondary | ICD-10-CM | POA: Diagnosis not present

## 2020-02-09 DIAGNOSIS — I10 Essential (primary) hypertension: Secondary | ICD-10-CM | POA: Diagnosis not present

## 2020-02-09 DIAGNOSIS — E785 Hyperlipidemia, unspecified: Secondary | ICD-10-CM | POA: Diagnosis not present

## 2020-02-09 DIAGNOSIS — E119 Type 2 diabetes mellitus without complications: Secondary | ICD-10-CM | POA: Diagnosis not present

## 2020-02-15 DIAGNOSIS — Z23 Encounter for immunization: Secondary | ICD-10-CM | POA: Diagnosis not present

## 2020-05-05 DIAGNOSIS — M159 Polyosteoarthritis, unspecified: Secondary | ICD-10-CM | POA: Diagnosis not present

## 2020-05-05 DIAGNOSIS — E785 Hyperlipidemia, unspecified: Secondary | ICD-10-CM | POA: Diagnosis not present

## 2020-05-05 DIAGNOSIS — I10 Essential (primary) hypertension: Secondary | ICD-10-CM | POA: Diagnosis not present

## 2020-05-07 ENCOUNTER — Ambulatory Visit (HOSPITAL_COMMUNITY)
Admission: RE | Admit: 2020-05-07 | Discharge: 2020-05-07 | Disposition: A | Discharge status: Home / Self Care | Payer: Medicare Other | Source: Ambulatory Visit | Attending: Family Medicine | Admitting: Family Medicine

## 2020-05-07 ENCOUNTER — Other Ambulatory Visit: Payer: Self-pay

## 2020-05-07 ENCOUNTER — Other Ambulatory Visit (HOSPITAL_COMMUNITY): Payer: Self-pay | Admitting: Family Medicine

## 2020-05-07 DIAGNOSIS — M25552 Pain in left hip: Secondary | ICD-10-CM | POA: Diagnosis not present

## 2020-05-07 DIAGNOSIS — G8929 Other chronic pain: Secondary | ICD-10-CM | POA: Diagnosis not present

## 2020-05-07 DIAGNOSIS — M1712 Unilateral primary osteoarthritis, left knee: Secondary | ICD-10-CM | POA: Diagnosis not present

## 2020-07-16 DIAGNOSIS — Z0000 Encounter for general adult medical examination without abnormal findings: Secondary | ICD-10-CM | POA: Diagnosis not present

## 2020-07-16 DIAGNOSIS — R7303 Prediabetes: Secondary | ICD-10-CM | POA: Diagnosis not present

## 2020-08-06 ENCOUNTER — Other Ambulatory Visit: Payer: Self-pay

## 2020-08-06 ENCOUNTER — Ambulatory Visit: Payer: Medicare Other | Admitting: Orthopedic Surgery

## 2020-08-06 ENCOUNTER — Encounter: Payer: Self-pay | Admitting: Orthopedic Surgery

## 2020-08-06 VITALS — BP 171/100 | HR 77 | Ht 61.5 in | Wt 236.0 lb

## 2020-08-06 DIAGNOSIS — M1612 Unilateral primary osteoarthritis, left hip: Secondary | ICD-10-CM | POA: Diagnosis not present

## 2020-08-06 DIAGNOSIS — E65 Localized adiposity: Secondary | ICD-10-CM

## 2020-08-06 DIAGNOSIS — Z6841 Body Mass Index (BMI) 40.0 and over, adult: Secondary | ICD-10-CM

## 2020-08-06 MED ORDER — DICLOFENAC POTASSIUM 50 MG PO TABS: 50.0000 mg | ORAL_TABLET | Freq: Two times a day (BID) | ORAL | 3 refills | Status: DC

## 2020-08-06 NOTE — Progress Notes (Signed)
Chief Complaint  Patient presents with  . Knee Pain    Left  . Hip Pain    Left    History this is a 75 year old female who is status post a right total hip many years ago comes in complaining of left hip pain groin and anterior thigh pain on the left lower extremity she has had x-rays of her knee and hip which show degenerative arthritis of the left hip fairly normal left knee  Past Medical History:  Diagnosis Date  . Acid reflux   . Arthritis   . Eczema   . High blood pressure   . High cholesterol     Past Surgical History:  Procedure Laterality Date  . ANAL FISSURECTOMY    . BACK SURGERY  Lumbar  . CARPAL TUNNEL RELEASE Right   . COLONOSCOPY N/A 02/21/2017   Procedure: COLONOSCOPY;  Surgeon: Danie Binder, MD;  Location: [facility];  Service: Endoscopy;  Laterality: N/A;  8:30 am - patient refused to move up at all  . HIP SURGERY Right   . VESICOVAGINAL FISTULA CLOSURE W/ TAH      BP (!) 171/100   Pulse 77   Ht 5' 1.5" (1.562 m)   Wt 236 lb (107 kg)   BMI 43.87 kg/m    Physical Exam Constitutional:      General: She is not in acute distress.    Appearance: She is well-developed.     Comments: Well developed, well nourished Normal grooming and hygiene     Cardiovascular:     Comments: No peripheral edema Musculoskeletal:     Comments: The left leg is in compensatory external rotation she has normal flexion of the hip and only pain with internal rotation her internal rotation is limited to about 20 degrees  Skin:    General: Skin is warm and dry.  Neurological:     Mental Status: She is alert and oriented to person, place, and time.     Sensory: No sensory deficit.     Coordination: Coordination normal.     Gait: Gait normal.     Deep Tendon Reflexes: Reflexes are normal and symmetric.  Psychiatric:        Mood and Affect: Mood normal.        Behavior: Behavior normal.        Thought Content: Thought content normal.        Judgment: Judgment normal.      Comments: Affect normal     Encounter Diagnoses  Name Primary?  . OBESITY, TRUNCAL   . Primary osteoarthritis of left hip Yes  . Body mass index 40.0-44.9, adult ([place])   . Morbid obesity ([place])     Plan patient should undergo nutrition consult for weight loss of approximately 30 pounds, she should have physical therapy and anti-inflammatory medication to control arthritic pain  She will let us know when she is ready for surgery based on her pain and functional deficits.  Wt loss  Therapy NSAID Nutrition consult   BP (!) 171/100   Pulse 77   Ht 5' 1.5" (1.562 m)   Wt 236 lb (107 kg)   BMI 43.87 kg/m   Meds ordered this encounter  Medications  . diclofenac (CATAFLAM) 50 MG tablet    Sig: Take 1 tablet (50 mg total) by mouth 2 (two) times daily.    Dispense:  90 tablet    Refill:  3

## 2020-08-06 NOTE — Patient Instructions (Signed)
Hip Pain The hip is the joint between the upper legs and the lower pelvis. The bones, cartilage, tendons, and muscles of your hip joint support your body and allow you to move around. Hip pain can range from a minor ache to severe pain in one or both of your hips. The pain may be felt on the inside of the hip joint near the groin, or on the outside near the buttocks and upper thigh. You may also have swelling or stiffness in your hip area. Follow these instructions at home: Managing pain, stiffness, and swelling  If directed, put ice on the painful area. To do this: ? Put ice in a plastic bag. ? Place a towel between your skin and the bag. ? Leave the ice on for 20 minutes, 2-3 times a day.  If directed, apply heat to the affected area as often as told by your health care provider. Use the heat source that your health care provider recommends, such as a moist heat pack or a heating pad. ? Place a towel between your skin and the heat source. ? Leave the heat on for 20-30 minutes. ? Remove the heat if your skin turns bright red. This is especially important if you are unable to feel pain, heat, or cold. You may have a greater risk of getting burned.      Activity  Do exercises as told by your health care provider.  Avoid activities that cause pain. General instructions  Take over-the-counter and prescription medicines only as told by your health care provider.  Keep a journal of your symptoms. Write down: ? How often you have hip pain. ? The location of your pain. ? What the pain feels like. ? What makes the pain worse.  Sleep with a pillow between your legs on your most comfortable side.  Keep all follow-up visits as told by your health care provider. This is important.   Contact a health care provider if:  You cannot put weight on your leg.  Your pain or swelling continues or gets worse after one week.  It gets harder to walk.  You have a fever. Get help right away  if:  You fall.  You have a sudden increase in pain and swelling in your hip.  Your hip is red or swollen or very tender to touch. Summary  Hip pain can range from a minor ache to severe pain in one or both of your hips.  The pain may be felt on the inside of the hip joint near the groin, or on the outside near the buttocks and upper thigh.  Avoid activities that cause pain.  Write down how often you have hip pain, the location of the pain, what makes it worse, and what it feels like. This information is not intended to replace advice given to you by your health care provider. Make sure you discuss any questions you have with your health care provider. Document Revised: 08/14/2018 Document Reviewed: 08/14/2018 Elsevier Patient Education  2021 Elsevier Inc.  

## 2020-08-13 ENCOUNTER — Ambulatory Visit (HOSPITAL_COMMUNITY): Payer: Medicare Other | Attending: Orthopedic Surgery | Admitting: Physical Therapy

## 2020-08-13 ENCOUNTER — Encounter (HOSPITAL_COMMUNITY): Payer: Self-pay | Admitting: Physical Therapy

## 2020-08-13 ENCOUNTER — Encounter: Payer: Medicare Other | Attending: Orthopedic Surgery | Admitting: Nutrition

## 2020-08-13 ENCOUNTER — Other Ambulatory Visit: Payer: Self-pay

## 2020-08-13 ENCOUNTER — Encounter: Payer: Self-pay | Admitting: Nutrition

## 2020-08-13 VITALS — Ht 61.0 in | Wt 234.6 lb

## 2020-08-13 DIAGNOSIS — M25552 Pain in left hip: Secondary | ICD-10-CM | POA: Diagnosis not present

## 2020-08-13 DIAGNOSIS — E782 Mixed hyperlipidemia: Secondary | ICD-10-CM

## 2020-08-13 DIAGNOSIS — R2689 Other abnormalities of gait and mobility: Secondary | ICD-10-CM | POA: Diagnosis not present

## 2020-08-13 DIAGNOSIS — I10 Essential (primary) hypertension: Secondary | ICD-10-CM

## 2020-08-13 DIAGNOSIS — E65 Localized adiposity: Secondary | ICD-10-CM

## 2020-08-13 NOTE — Therapy (Signed)
[place], [place], [address] Phone: [phone]   Fax:  [phone]  Physical Therapy Evaluation  Patient Details  Name: Jennifer Bennett MRN: [id number] Date of Birth: 29-Jul-1945 Referring Provider (PT): Arther Abbott MD   Encounter Date: 08/13/2020   PT End of Session - 08/13/20 1747    Visit Number 1    Number of Visits 8    Date for PT Re-Evaluation 09/11/20    Authorization Type U[employer] Medicare    Progress Note Due on Visit 8    PT Start Time 1650    PT Stop Time 1735    PT Time Calculation (min) 45 min    Activity Tolerance Patient tolerated treatment well    Behavior During Therapy [facility] for tasks assessed/performed           Past Medical History:  Diagnosis Date  . Acid reflux   . Arthritis   . Eczema   . High blood pressure   . High cholesterol     Past Surgical History:  Procedure Laterality Date  . ANAL FISSURECTOMY    . BACK SURGERY  Lumbar  . CARPAL TUNNEL RELEASE Right   . COLONOSCOPY N/A 02/21/2017   Procedure: COLONOSCOPY;  Surgeon: Danie Binder, MD;  [facility];  Service: Endoscopy;  Laterality: N/A;  8:30 am - patient refused to move up at all  . HIP SURGERY Right   . VESICOVAGINAL FISTULA CLOSURE W/ TAH      There were no vitals filed for this visit.    Subjective Assessment - 08/13/20 1701    Subjective Patient presents to physical therapy with complaint of LT hip pain. She says this has been less than a year. She reports history of RT THA 17 years ago. She describes come contribution to her weight. She has just recently been prescribed anti inflammatory which she takes as needed.    Pertinent History RT THA, lumbar fusion    Limitations Standing;Walking    How long can you stand comfortably? 10 minutes    How long can you walk comfortably? <10 minutes    Patient Stated Goals Help with weight loss, walk better, improve stairs    Currently in Pain?  No/denies              [facility] PT Assessment - 08/13/20 0001      Assessment   Medical Diagnosis Primary osteoarthritis of left hip    Referring Provider (PT) Arther Abbott MD    Onset Date/Surgical Date --   < 1 year   Prior Therapy Yes for opposite hip      Balance Screen   Has the patient fallen in the past 6 months No      [place] residence      Prior Function   Level of Independence Independent      Cognition   Overall Cognitive Status Within Functional Limits for tasks assessed      Observation/Other Assessments   Focus on Therapeutic Outcomes (FOTO)  49% function      ROM / Strength   AROM / PROM / Strength AROM;Strength      AROM   AROM Assessment Site Hip    Right/Left Hip Right;Left    Right Hip Flexion 70    Left Hip Flexion 85   soft tissue limitation     Strength   Overall Strength Comments abduction tested in supine  Strength Assessment Site Hip;Knee    Right/Left Hip Right;Left    Right Hip Flexion 4+/5    Right Hip ABduction 4+/5    Left Hip Flexion 4/5    Left Hip ABduction 4/5    Right/Left Knee Right;Left    Right Knee Extension 4+/5    Left Knee Extension 4/5      Transfers   Five time sit to stand comments  22.5 seconds with minimal use of UEs      Ambulation/Gait   Ambulation/Gait Yes    Ambulation/Gait Assistance 6: Modified independent (Device/Increase time)    Assistive device Straight cane    Gait Pattern Decreased step length - right;Decreased step length - left;Decreased stride length    Ambulation Surface Level;Indoor                      Objective measurements completed on examination: See above findings.       [place] [facility] Adult PT Treatment/Exercise - 08/13/20 0001      Exercises   Exercises Knee/Hip      Knee/Hip Exercises: Supine   Other Supine Knee/Hip Exercises glute set 5 x 5", iso hip abd/ add 5 x 5"                  PT Education - 08/13/20 1704     Education Details on evaluation findings, POC and HEP    Person(s) Educated Patient    Methods Explanation;Handout    Comprehension Verbalized understanding            PT Short Term Goals - 08/13/20 1750      PT SHORT TERM GOAL #1   Title Patient will be independent with initial HEP and self-management strategies to improve functional outcomes    Time 2    Period Weeks    Status New    Target Date 08/27/20             PT Long Term Goals - 08/13/20 1750      PT LONG TERM GOAL #1   Title Patient will improve FOTO score by at least 5% to indicate improvement in functional outcomes    Time 4    Period Weeks    Status New    Target Date 09/11/20      PT LONG TERM GOAL #2   Title Patient will report at least 70% overall improvement in subjective complaint to indicate improvement in ability to perform ADLs.    Time 4    Period Weeks    Status New    Target Date 09/11/20      PT LONG TERM GOAL #3   Title Patient will be able to perform stand x 5 in < 15 seconds to demonstrate improvement in functional mobility and reduced risk for falls.    Time 4    Period Weeks    Status New    Target Date 09/11/20      PT LONG TERM GOAL #4   Title Patient will be able to ambulate at least 300 feet during 2MWT with LRAD to demonstrate improved ability to perform functional mobility and associated tasks.    Time 4    Period Weeks    Status New    Target Date 09/11/20                  Plan - 08/13/20 1747    Clinical Impression Statement Patient is a 75 y.o. female who presents to physical therapy  with complaint of LT hip pain. Patient demonstrates decreased strength, ROM restriction, and gait abnormalities which are likely contributing to symptoms of pain and are negatively impacting patient ability to perform ADLs and functional mobility tasks. Patient will benefit from skilled physical therapy services to address these deficits to reduce pain and improve level of function  with ADLs and functional mobility tasks.    Examination-Activity Limitations Stand;Transfers;Stairs;Locomotion Level    Examination-Participation Restrictions Community Activity;Occupation;Cleaning    Stability/Clinical Decision Making Stable/Uncomplicated    Clinical Decision Making Low    Rehab Potential Good    PT Frequency 2x / week    PT Duration 4 weeks    PT Treatment/Interventions ADLs/Self Care Home Management;Aquatic Therapy;Biofeedback;Cryotherapy;Electrical Stimulation;Stair training;Functional mobility training;Orthotic Fit/Training;Therapeutic activities;Manual techniques;Therapeutic exercise;Moist Heat;Iontophoresis 4mg /ml Dexamethasone;Traction;Balance training;Manual lymph drainage;Vestibular;Vasopneumatic Device;Taping;Splinting;Energy conservation;Dry needling;Joint Manipulations;Spinal Manipulations;Passive range of motion;Patient/family education;DME Instruction;Gait training;Contrast Bath;Fluidtherapy;Parrafin;Ultrasound;Neuromuscular re-education;Compression bandaging;Visual/perceptual remediation/compensation;Scar mobilization    PT Next Visit Plan Review goals. Progress glute and quad strength as tolerated. Progress to gait and balance acitivty when able. Add SLR, sit to stand, heel raise .    PT Home Exercise Plan Eval: glute set, iso hip abduction/ adduction    Consulted and Agree with Plan of Care Patient           Patient will benefit from skilled therapeutic intervention in order to improve the following deficits and impairments:  Abnormal gait,Decreased activity tolerance,Decreased balance,Decreased range of motion,Difficulty walking,Pain,Decreased strength  Visit Diagnosis: Pain in left hip  Other abnormalities of gait and mobility     Problem List Patient Active Problem List   Diagnosis Date Noted  . Special screening for malignant neoplasms, colon   . Primary osteoarthritis of right knee 01/22/2014  . Gout of big toe 07/24/2013  . Osteoarthritis of  right knee 07/24/2013  . OA (osteoarthritis) of knee 06/20/2012  . Effusion of knee joint 05/16/2012  . Arthritis of knee, degenerative 05/16/2012  . Knee pain 05/16/2012  . KNEE PAIN 06/17/2009  . COUGH 02/12/2009  . HIP, ARTHRITIS, DEGEN./OSTEO 12/06/2007  . HYPERLIPIDEMIA 04/18/2007  . OBESITY, TRUNCAL 04/18/2007  . TOBACCO USER 04/18/2007  . HYPERTENSION 04/18/2007  . ALLERGIC RHINITIS 04/18/2007  . PULMONARY NODULE 04/18/2007  . DYSPNEA 04/18/2007   5:57 PM, 08/13/20 Josue Hector PT DPT  Physical Therapist with [facility]  [phone] [place] [address] [place], [place], [address] Phone: [phone]   Fax:  [phone]  Name: Jennifer Bennett MRN: [id number] Date of Birth: 07/20/1945

## 2020-08-13 NOTE — Patient Instructions (Signed)
Access Code: [email or web address] URL: [email or web address] Date: 08/13/2020 Prepared by: Josue Hector  Exercises Hooklying Isometric Hip Abduction with Belt - 2-3 x daily - 7 x weekly - 2 sets - 10 reps - 5 seconds hold Supine Hip Adduction Isometric with Ball - 2-3 x daily - 7 x weekly - 2 sets - 10 reps - 5 seconds hold Supine Gluteal Sets - 2-3 x daily - 7 x weekly - 2 sets - 10 reps - 5 seconds hold

## 2020-08-13 NOTE — Patient Instructions (Signed)
Goals  Follow my Plate Eat meals on ti me Do not skip meals Drink only water and cut out sodas, juice and tea Cut out processed and fried foods

## 2020-08-13 NOTE — Progress Notes (Signed)
Medical Nutrition Therapy  Appointment Start time: 6384  Appointment End time:  6659  Primary concerns today: Obesity,   Referral diagnosis: E66.01 Preferred learning style:  no preference indicated Learning readiness: ready, change in progress   NUTRITION ASSESSMENT   Anthropometrics  Wt Readings from Last 3 Encounters:  08/13/20 234 lb 9.6 oz (106.4 kg)  08/06/20 236 lb (107 kg)  02/21/17 240 lb (108.9 kg)   Ht Readings from Last 3 Encounters:  08/13/20 5\' 1"  (1.549 m)  08/06/20 5' 1.5" (1.562 m)  02/04/15 5\' 1"  (1.549 m)   Body mass index is 44.33 kg/m. @BMIFA @ Facility age limit for growth percentiles is 20 years. Facility age limit for growth percentiles is 20 years.   Clinical Medical Hx: Arthritis, GOUT, Hyperlipidemia, Medications: See chart Labs: none Notable Signs/Symptoms: fatigue, emotional eating, craves  Popcorn daily and sweets  Lifestyle & Dietary Hx Wants to lose weight. Limited activity due to arthritis. Sometimes only eats 2 meals per day Loves popcorn  Estimated daily fluid intake:  16 oz Supplements:  Sleep: 6-8 hrs Stress / self-care: family  Current average weekly physical activity: ADL  24-Hr Dietary Recall First Meal: skipped Lunch: 2 pm; salad with chicken from [employer], Water Dinner: Salad, [employer], onions, [place] dressing, 2 chicken wings, Water flavored Apple. Snacks: Usually has popcorn microwave or ice cream,    Estimated Energy Needs Calories: 1200 Carbohydrate: 135g Protein 90 g Fat:33 g   NUTRITION DIAGNOSIS  NI-1.7 Predicted excessive energy intake As related to Emotional and boredum eating.  As evidenced by BMI 44 and diet recall.   NUTRITION INTERVENTION  Nutrition education (E-1) on the following topics:  . Nutrition and Pre Diabetes education provided on My Plate, CHO counting, meal planning, portion sizes, timing of meals, avoiding snacks between meals unless having a low blood sugar, taking medications as  prescribed, benefits of exercising 30 minutes per day and prevention of DM. Marland Kitchen Weight loss tips . Keep food journal       Emotional eating  Handouts Provided Include   MyPlate  Meal Plan Card  Weight loss tips    Learning Style & Readiness for Change Teaching method utilized: Visual & Auditory  Demonstrated degree of understanding via: Teach Back  Barriers to learning/adherence to lifestyle change: none  Goals Established by Pt Goals  Follow my Plate Eat meals on ti me Do not skip meals Drink only water and cut out sodas, juice and tea Cut out processed and fried foods Cut out popcorn Increase fresh fruits and vegetables.  MONITORING & EVALUATION Dietary intake, weekly physical activity, and weight in 1 month.  Next Steps  Patient is to get rid of popcorn in the house.Marland Kitchen

## 2020-08-18 ENCOUNTER — Telehealth: Payer: Self-pay | Admitting: Orthopedic Surgery

## 2020-08-18 NOTE — Telephone Encounter (Signed)
Patient called and stated that she was going to PT and has been told that her copay for each visit would be 325.00.  She wanted to know if that was "the norm" for PT copays.  I told her that I didn't know what her insurance was being charged or what her copay would be.  I asked her if she had spoken to someone at PT about this.  She said she had not.  She will call Izora Gala at PT here in [place] to see if she can get some answers in regards to what her true copay is.

## 2020-08-19 ENCOUNTER — Encounter: Payer: Self-pay | Admitting: Nutrition

## 2020-08-19 ENCOUNTER — Ambulatory Visit (HOSPITAL_COMMUNITY): Payer: Medicare Other

## 2020-08-21 ENCOUNTER — Other Ambulatory Visit: Payer: Self-pay

## 2020-08-21 ENCOUNTER — Encounter (HOSPITAL_COMMUNITY): Payer: Self-pay | Admitting: Physical Therapy

## 2020-08-21 ENCOUNTER — Ambulatory Visit (HOSPITAL_COMMUNITY): Payer: Medicare Other | Admitting: Physical Therapy

## 2020-08-21 DIAGNOSIS — M25552 Pain in left hip: Secondary | ICD-10-CM | POA: Diagnosis not present

## 2020-08-21 DIAGNOSIS — R2689 Other abnormalities of gait and mobility: Secondary | ICD-10-CM | POA: Diagnosis not present

## 2020-08-21 NOTE — Therapy (Signed)
[place], [place], [address] Phone: [phone]   Fax:  [phone]  Physical Therapy Treatment  Patient Details  Name: Jennifer Bennett MRN: [id number] Date of Birth: 1945-09-17 Referring Provider (PT): Arther Abbott MD   Encounter Date: 08/21/2020   PT End of Session - 08/21/20 1752    Visit Number 2    Number of Visits 8    Date for PT Re-Evaluation 09/11/20    Authorization Type UHC Medicare    Progress Note Due on Visit 8    PT Start Time 1745    PT Stop Time 1820    PT Time Calculation (min) 35 min    Activity Tolerance Patient tolerated treatment well;Patient limited by fatigue    Behavior During Therapy [facility] for tasks assessed/performed           Past Medical History:  Diagnosis Date  . Acid reflux   . Arthritis   . Eczema   . High blood pressure   . High cholesterol     Past Surgical History:  Procedure Laterality Date  . ANAL FISSURECTOMY    . BACK SURGERY  Lumbar  . CARPAL TUNNEL RELEASE Right   . COLONOSCOPY N/A 02/21/2017   Procedure: COLONOSCOPY;  Surgeon: Danie Binder, MD;  [facility];  Service: Endoscopy;  Laterality: N/A;  8:30 am - patient refused to move up at all  . HIP SURGERY Right   . VESICOVAGINAL FISTULA CLOSURE W/ TAH      There were no vitals filed for this visit.   Subjective Assessment - 08/21/20 1751    Subjective Patient says she hasn't been able to do HEP as much as instructed she has been busy with life events. Says her hip pain is not too bad today.    Pertinent History RT THA, lumbar fusion    Limitations Standing;Walking    How long can you stand comfortably? 10 minutes    How long can you walk comfortably? <10 minutes    Patient Stated Goals Help with weight loss, walk better, improve stairs    Currently in Pain? Yes    Pain Score 3     Pain Location Hip    Pain Orientation Left    Pain Descriptors / Indicators Aching                              [facility] Adult PT Treatment/Exercise - 08/21/20 0001      Knee/Hip Exercises: Standing   Hip Abduction Both;1 set;10 reps      Knee/Hip Exercises: Supine   Straight Leg Raises Both;1 set;10 reps    Other Supine Knee/Hip Exercises glute set 10 x 5", iso hip abd/ add 10 x 5"      Knee/Hip Exercises: Sidelying   Hip ABduction Limitations attempted but too difficult                    PT Short Term Goals - 08/13/20 1750      PT SHORT TERM GOAL #1   Title Patient will be independent with initial HEP and self-management strategies to improve functional outcomes    Time 2    Period Weeks    Status New    Target Date 08/27/20             PT Long Term Goals - 08/13/20 1750      PT LONG  TERM GOAL #1   Title Patient will improve FOTO score by at least 5% to indicate improvement in functional outcomes    Time 4    Period Weeks    Status New    Target Date 09/11/20      PT LONG TERM GOAL #2   Title Patient will report at least 70% overall improvement in subjective complaint to indicate improvement in ability to perform ADLs.    Time 4    Period Weeks    Status New    Target Date 09/11/20      PT LONG TERM GOAL #3   Title Patient will be able to perform stand x 5 in < 15 seconds to demonstrate improvement in functional mobility and reduced risk for falls.    Time 4    Period Weeks    Status New    Target Date 09/11/20      PT LONG TERM GOAL #4   Title Patient will be able to ambulate at least 300 feet during 2MWT with LRAD to demonstrate improved ability to perform functional mobility and associated tasks.    Time 4    Period Weeks    Status New    Target Date 09/11/20                 Plan - 08/21/20 1837    Clinical Impression Statement Patient tolerated session well overall. Reviewed therapy goals and HEP. Patient with noted muscle fatigue. Able to progress LE strength on mat, but with increased verbal cues for  form. Patient had difficulty maintaining position with sidelying hip abduction. Added standing hip abduction which was better, but required frequent cues to avoid external rotation of LE. Patient will continue to benefit from LE strength and core progressions to reduce pain and improve functional ability.    Examination-Activity Limitations Stand;Transfers;Stairs;Locomotion Level    Examination-Participation Restrictions Community Activity;Occupation;Cleaning    Stability/Clinical Decision Making Stable/Uncomplicated    Rehab Potential Good    PT Frequency 2x / week    PT Duration 4 weeks    PT Treatment/Interventions ADLs/Self Care Home Management;Aquatic Therapy;Biofeedback;Cryotherapy;Electrical Stimulation;Stair training;Functional mobility training;Orthotic Fit/Training;Therapeutic activities;Manual techniques;Therapeutic exercise;Moist Heat;Iontophoresis 4mg /ml Dexamethasone;Traction;Balance training;Manual lymph drainage;Vestibular;Vasopneumatic Device;Taping;Splinting;Energy conservation;Dry needling;Joint Manipulations;Spinal Manipulations;Passive range of motion;Patient/family education;DME Instruction;Gait training;Contrast Bath;Fluidtherapy;Parrafin;Ultrasound;Neuromuscular re-education;Compression bandaging;Visual/perceptual remediation/compensation;Scar mobilization    PT Next Visit Plan Review goals. Progress glute and quad strength as tolerated. Progress to gait and balance acitivty when able. Add sit to stand, heel raise .    PT Home Exercise Plan Eval: glute set, iso hip abduction/ adduction 5/12 standing hip abduciton, SLR    Consulted and Agree with Plan of Care Patient           Patient will benefit from skilled therapeutic intervention in order to improve the following deficits and impairments:  Abnormal gait,Decreased activity tolerance,Decreased balance,Decreased range of motion,Difficulty walking,Pain,Decreased strength  Visit Diagnosis: Pain in left hip  Other  abnormalities of gait and mobility     Problem List Patient Active Problem List   Diagnosis Date Noted  . Special screening for malignant neoplasms, colon   . Primary osteoarthritis of right knee 01/22/2014  . Gout of big toe 07/24/2013  . Osteoarthritis of right knee 07/24/2013  . OA (osteoarthritis) of knee 06/20/2012  . Effusion of knee joint 05/16/2012  . Arthritis of knee, degenerative 05/16/2012  . Knee pain 05/16/2012  . KNEE PAIN 06/17/2009  . COUGH 02/12/2009  . HIP, ARTHRITIS, DEGEN./OSTEO 12/06/2007  . HYPERLIPIDEMIA 04/18/2007  .  OBESITY, TRUNCAL 04/18/2007  . TOBACCO USER 04/18/2007  . HYPERTENSION 04/18/2007  . ALLERGIC RHINITIS 04/18/2007  . PULMONARY NODULE 04/18/2007  . DYSPNEA 04/18/2007    6:40 PM, 08/21/20 Josue Hector PT DPT  Physical Therapist with [facility]  [phone] [address] [place], [place], [address] Phone: [phone]   Fax:  [phone]  Name: Jennifer Bennett MRN: [id number] Date of Birth: 1945/11/23

## 2020-08-26 ENCOUNTER — Ambulatory Visit (HOSPITAL_COMMUNITY): Payer: Medicare Other

## 2020-08-28 ENCOUNTER — Encounter (HOSPITAL_COMMUNITY): Payer: Medicare Other

## 2020-09-02 ENCOUNTER — Encounter (HOSPITAL_COMMUNITY): Payer: Medicare Other | Admitting: Physical Therapy

## 2020-09-04 ENCOUNTER — Encounter (HOSPITAL_COMMUNITY): Payer: Medicare Other

## 2020-09-09 ENCOUNTER — Other Ambulatory Visit: Payer: Self-pay

## 2020-09-09 ENCOUNTER — Ambulatory Visit (HOSPITAL_COMMUNITY): Payer: Medicare Other

## 2020-09-09 ENCOUNTER — Encounter (HOSPITAL_COMMUNITY): Payer: Self-pay

## 2020-09-09 DIAGNOSIS — I10 Essential (primary) hypertension: Secondary | ICD-10-CM | POA: Diagnosis not present

## 2020-09-09 DIAGNOSIS — E785 Hyperlipidemia, unspecified: Secondary | ICD-10-CM | POA: Diagnosis not present

## 2020-09-09 DIAGNOSIS — R2689 Other abnormalities of gait and mobility: Secondary | ICD-10-CM

## 2020-09-09 DIAGNOSIS — M25552 Pain in left hip: Secondary | ICD-10-CM | POA: Diagnosis not present

## 2020-09-09 DIAGNOSIS — E119 Type 2 diabetes mellitus without complications: Secondary | ICD-10-CM | POA: Diagnosis not present

## 2020-09-09 NOTE — Therapy (Signed)
[facility] [address] [place], [place], [address] Phone: [phone]   Fax:  [phone]  Physical Therapy Treatment Progress Note Reporting Period 08/13/20 to 09/09/20  See note below for Objective Data and Assessment of Progress/Goals.      Patient Details  Name: Jennifer Bennett MRN: [id number] Date of Birth: September 28, 1945 Referring Provider (PT): Arther Abbott MD   Encounter Date: 09/09/2020   PT End of Session - 09/09/20 1803    Visit Number 3    Number of Visits 8    Authorization Type UHC Medicare    Progress Note Due on Visit 8    PT Start Time 6962    PT Stop Time 1833    PT Time Calculation (min) 41 min    Activity Tolerance Patient tolerated treatment well;Patient limited by fatigue    Behavior During Therapy [facility] for tasks assessed/performed           Past Medical History:  Diagnosis Date  . Acid reflux   . Arthritis   . Eczema   . High blood pressure   . High cholesterol     Past Surgical History:  Procedure Laterality Date  . ANAL FISSURECTOMY    . BACK SURGERY  Lumbar  . CARPAL TUNNEL RELEASE Right   . COLONOSCOPY N/A 02/21/2017   Procedure: COLONOSCOPY;  Surgeon: Danie Binder, MD;  [facility];  Service: Endoscopy;  Laterality: N/A;  8:30 am - patient refused to move up at all  . HIP SURGERY Right   . VESICOVAGINAL FISTULA CLOSURE W/ TAH      There were no vitals filed for this visit.   Subjective Assessment - 09/09/20 1800    Subjective Pt stated she has had a loss of family recently.  Reports Lt hip is sore today, pain scale 5/10.  Stated she has been in a lot of stress and returned to work today, was tired.  Admits to not completing HEP since was here last.    Pertinent History RT THA, lumbar fusion    Patient Stated Goals Help with weight loss, walk better, improve stairs    Currently in Pain? Yes    Pain Score 5     Pain Location Hip    Pain Orientation Left    Pain  Descriptors / Indicators Aching;Sore    Pain Type Chronic pain    Pain Onset More than a month ago    Pain Frequency Intermittent    Aggravating Factors  walking    Pain Relieving Factors staying off of it.              [facility] PT Assessment - 09/09/20 0001      Assessment   Medical Diagnosis Primary osteoarthritis of left hip    Referring Provider (PT) Arther Abbott MD    Onset Date/Surgical Date --   <1 year   Prior Therapy Yes for opposite hip      Observation/Other Assessments   Focus on Therapeutic Outcomes (FOTO)  43.3548% function   was 49% function     Transfers   Five time sit to stand comments  11.95"   was 22.5"     Ambulation/Gait   Ambulation Distance (Feet) 226 Feet    Assistive device None                         [facility] Adult PT Treatment/Exercise - 09/09/20 0001  Knee/Hip Exercises: Standing   Heel Raises 10 reps    Other Standing Knee Exercises sidestep 2RT down hallway                  PT Education - 09/09/20 1844    Education Details Discussed importance of HEP for maximal benefits.    Person(s) Educated Patient    Methods Explanation;Demonstration;Handout    Comprehension Verbalized understanding;Need further instruction            PT Short Term Goals - 09/09/20 1804      PT SHORT TERM GOAL #1   Title Patient will be independent with initial HEP and self-management strategies to improve functional outcomes    Baseline 09/09/20:  Admits to not completing regularly    Status Not Met             PT Long Term Goals - 09/09/20 1805      PT LONG TERM GOAL #1   Title Patient will improve FOTO score by at least 5% to indicate improvement in functional outcomes    Baseline 09/09/20: 43.3548% function  was 49% function    Status Not Met      PT LONG TERM GOAL #2   Title Patient will report at least 70% overall improvement in subjective complaint to indicate improvement in ability to perform ADLs.    Baseline  09/09/20:  Reports no improvements    Status Not Met      PT LONG TERM GOAL #3   Title Patient will be able to perform stand x 5 in < 15 seconds to demonstrate improvement in functional mobility and reduced risk for falls.    Baseline 09/09/20: 5STS 11.95"    Status Achieved      PT LONG TERM GOAL #4   Title Patient will be able to ambulate at least 300 feet during 2MWT with LRAD to demonstrate improved ability to perform functional mobility and associated tasks.    Baseline 09/09/20:  2MWT 269ft no AD    Status On-going                 Plan - 09/09/20 1838    Clinical Impression Statement Pt emotional due to recent loss of sister and concerned with cost of therapy.  Cert up today, reviewed goals with the following findings:  Pt admits to non-compliance with current HEP, stated she is busy wiht work and not motivated to complete after work.  Educated pt importance of compliance with exercises at home for maximal benefits following therapy.  Pt has met 0/1 STG and 1/4 LTGs.  Able to complete 5STS in 11.95" (was 22").  Pt limited by fatigue and c/o SOB during 2MWT.  No reports of improvements with therapy (1 eval and 2nd tx today).    Examination-Activity Limitations Stand;Transfers;Stairs;Locomotion Level    Examination-Participation Restrictions Community Activity;Occupation;Cleaning    Stability/Clinical Decision Making Stable/Uncomplicated    Clinical Decision Making Low    Rehab Potential Good    PT Frequency 2x / week    PT Duration 4 weeks    PT Treatment/Interventions ADLs/Self Care Home Management;Aquatic Therapy;Biofeedback;Cryotherapy;Electrical Stimulation;Stair training;Functional mobility training;Orthotic Fit/Training;Therapeutic activities;Manual techniques;Therapeutic exercise;Moist Heat;Iontophoresis 4mg /ml Dexamethasone;Traction;Balance training;Manual lymph drainage;Vestibular;Vasopneumatic Device;Taping;Splinting;Energy conservation;Dry needling;Joint  Manipulations;Spinal Manipulations;Passive range of motion;Patient/family education;DME Instruction;Gait training;Contrast Bath;Fluidtherapy;Parrafin;Ultrasound;Neuromuscular re-education;Compression bandaging;Visual/perceptual remediation/compensation;Scar mobilization    PT Next Visit Plan Pt to call insurance and will call back to tell us whether she wishes to return to therapy of not.   Progress glute and quad strength  as tolerated. Progress to gait and balance acitivty when able. Add sit to stand, heel raise .    PT Home Exercise Plan Eval: glute set, iso hip abduction/ adduction 5/12 standing hip abduciton, SLR; 5/31: sidestep with support and heel raises.    Consulted and Agree with Plan of Care Patient           Patient will benefit from skilled therapeutic intervention in order to improve the following deficits and impairments:  Abnormal gait,Decreased activity tolerance,Decreased balance,Decreased range of motion,Difficulty walking,Pain,Decreased strength  Visit Diagnosis: Pain in left hip  Other abnormalities of gait and mobility     Problem List Patient Active Problem List   Diagnosis Date Noted  . Special screening for malignant neoplasms, colon   . Primary osteoarthritis of right knee 01/22/2014  . Gout of big toe 07/24/2013  . Osteoarthritis of right knee 07/24/2013  . OA (osteoarthritis) of knee 06/20/2012  . Effusion of knee joint 05/16/2012  . Arthritis of knee, degenerative 05/16/2012  . Knee pain 05/16/2012  . KNEE PAIN 06/17/2009  . COUGH 02/12/2009  . HIP, ARTHRITIS, DEGEN./OSTEO 12/06/2007  . HYPERLIPIDEMIA 04/18/2007  . OBESITY, TRUNCAL 04/18/2007  . TOBACCO USER 04/18/2007  . HYPERTENSION 04/18/2007  . ALLERGIC RHINITIS 04/18/2007  . PULMONARY NODULE 04/18/2007  . DYSPNEA 04/18/2007   Ihor Austin, LPTA/CLT; CBIS [phone]  Aldona Lento 09/09/2020, 6:48 PM  [facility] [place], [place], [address] Phone: [phone]   Fax:  [phone]  Name: Chondra Boyde MRN: [id number] Date of Birth: January 19, 1946

## 2020-09-09 NOTE — Patient Instructions (Signed)
AMBULATION: Side Step    With counter/sink available for your hands.  Keeping toes pointed earlier, step sideways. Repeat in opposite direction. 10 reps per set, 2 sets per day, 4 days per week Use assistive device. Walk to tempo of metronome or music.  Copyright  VHI. All rights reserved.   Heel Raise (Calf Strength / Balance)    Stand with support. Breathe in. Rise up on tiptoes, breathing out through pursed lips. Hold position to count of 3". Return slowly, breathing in. Repeat 10_ times per session. Do 2 sessions per day. Variation: Do without weights.  Copyright  VHI. All rights reserved.

## 2020-09-11 ENCOUNTER — Encounter (HOSPITAL_COMMUNITY): Payer: Medicare Other | Admitting: Physical Therapy

## 2020-09-24 ENCOUNTER — Encounter (HOSPITAL_COMMUNITY): Payer: Self-pay | Admitting: Physical Therapy

## 2020-09-24 ENCOUNTER — Encounter: Payer: Medicare Other | Attending: Family Medicine | Admitting: Nutrition

## 2020-09-24 ENCOUNTER — Ambulatory Visit (HOSPITAL_COMMUNITY): Payer: Medicare Other | Attending: Orthopedic Surgery | Admitting: Physical Therapy

## 2020-09-24 ENCOUNTER — Other Ambulatory Visit: Payer: Self-pay

## 2020-09-24 VITALS — Ht 61.5 in | Wt 232.8 lb

## 2020-09-24 DIAGNOSIS — M25552 Pain in left hip: Secondary | ICD-10-CM | POA: Diagnosis not present

## 2020-09-24 DIAGNOSIS — I10 Essential (primary) hypertension: Secondary | ICD-10-CM | POA: Insufficient documentation

## 2020-09-24 DIAGNOSIS — R2689 Other abnormalities of gait and mobility: Secondary | ICD-10-CM | POA: Insufficient documentation

## 2020-09-24 DIAGNOSIS — E782 Mixed hyperlipidemia: Secondary | ICD-10-CM | POA: Insufficient documentation

## 2020-09-24 DIAGNOSIS — E65 Localized adiposity: Secondary | ICD-10-CM

## 2020-09-24 NOTE — Progress Notes (Signed)
Medical Nutrition Therapy Follow up Appointment Start time: 1500 Appointment End time:  1530  Primary concerns today: Obesity,   Referral diagnosis: E66.01 Preferred learning style:  no preference indicated Learning readiness: ready, change in progress   NUTRITION ASSESSMENT  Changes made: Cut down on popcorn and no longer using Salt. Lost 2 lbs since last visit. Still skipping meals and not meal planning as well as she had planned to. Has had a lot of stress and working on getting that under control with family issues. Diet is still high in fat, sodium and low in fresh fruits and vegetables.  Anthropometrics  Wt Readings from Last 3 Encounters:  09/24/20 232 lb 12.8 oz (105.6 kg)  08/13/20 234 lb 9.6 oz (106.4 kg)  08/06/20 236 lb (107 kg)   Ht Readings from Last 3 Encounters:  09/24/20 5' 1.5" (1.562 m)  08/13/20 5\' 1"  (1.549 m)  08/06/20 5' 1.5" (1.562 m)   Body mass index is 43.27 kg/m. @BMIFA @ Facility age limit for growth percentiles is 20 years. Facility age limit for growth percentiles is 20 years.   Clinical Medical Hx: Arthritis, GOUT, Hyperlipidemia, Medications: See chart Labs: none Notable Signs/Symptoms: fatigue, emotional eating, craves  Popcorn daily and sweets  Lifestyle & Dietary Hx Wants to lose weight. Limited activity due to arthritis. Sometimes only eats 2 meals per day  Estimated daily fluid intake:  16 oz Supplements:  Sleep: 6-8 hrs Stress / self-care: family  Current average weekly physical activity: ADL  24-Hr Dietary Recall First Meal: skipped Lunch: 2 pm; salad with chicken from [employer], Water Dinner: burger from [employer],  [employer]. Snacks: Usually has popcorn microwave or ice cream,  Estimated Energy Needs Calories: 1200 Carbohydrate: 135g Protein 90 g Fat:33 g   NUTRITION DIAGNOSIS  NI-1.7 Predicted excessive energy intake As related to Emotional and boredum eating.  As evidenced by BMI 44 and diet recall.   NUTRITION  INTERVENTION  Nutrition education (E-1) on the following topics:  Nutrition and Pre Diabetes education provided on My Plate, CHO counting, meal planning, portion sizes, timing of meals, avoiding snacks between meals unless having a low blood sugar, taking medications as prescribed, benefits of exercising 30 minutes per day and prevention of DM. Weight loss tips Keep food journal       Emotional eating  Handouts Provided Include  MyPlate Meal Plan Card Weight loss tips   Learning Style & Readiness for Change Teaching method utilized: Visual & Auditory  Demonstrated degree of understanding via: Teach Back  Barriers to learning/adherence to lifestyle change: none  Goals Established by Pt Try not to skip meals Eat breakfast daily Increase lower carb vegetables Try to make lunch and take it daily.    MONITORING & EVALUATION Dietary intake, weekly physical activity, and weight in 1 month.  Next Steps  Patient is to work on meal planning.

## 2020-09-24 NOTE — Patient Instructions (Signed)
  Goals Established by Pt Try not to skip meals Eat breakfast daily Increase lower carb vegetables Try to make lunch and take it daily.

## 2020-09-24 NOTE — Therapy (Signed)
[facility] [place], [place], [address] Phone: [phone]   Fax:  [phone]  Physical Therapy Treatment  Patient Details  Name: Tiea Manninen MRN: [id number] Date of Birth: 06/23/1945 Referring Provider (PT): Arther Abbott MD  Progress Note Reporting Period 08/13/20 to 09/24/20  See note below for Objective Data and Assessment of Progress/Goals.      Encounter Date: 09/24/2020   PT End of Session - 09/24/20 1737     Visit Number 4    Number of Visits 12    Date for PT Re-Evaluation 10/22/20    Authorization Type UHC Medicare    Progress Note Due on Visit 8    PT Start Time 1732    PT Stop Time 1810    PT Time Calculation (min) 38 min    Activity Tolerance Patient tolerated treatment well;Patient limited by fatigue    Behavior During Therapy WFL for tasks assessed/performed             Past Medical History:  Diagnosis Date   Acid reflux    Arthritis    Eczema    High blood pressure    High cholesterol     Past Surgical History:  Procedure Laterality Date   ANAL FISSURECTOMY     BACK SURGERY  Lumbar   CARPAL TUNNEL RELEASE Right    COLONOSCOPY N/A 02/21/2017   Procedure: COLONOSCOPY;  Surgeon: Danie Binder, MD;  [facility];  Service: Endoscopy;  Laterality: N/A;  8:30 am - patient refused to move up at all   Progress      There were no vitals filed for this visit.   Subjective Assessment - 09/24/20 1737     Subjective Patient says things are "no better, no worse". She states her pain is no longer excruciating. Still not completing HEP often. Having difficulty getting in a "routine".    Pertinent History RT THA, lumbar fusion    Patient Stated Goals Help with weight loss, walk better, improve stairs    Currently in Pain? Yes    Pain Score 6     Pain Location Hip    Pain Orientation Left    Pain Descriptors /  Indicators Aching    Pain Type Chronic pain    Pain Onset More than a month ago    Pain Frequency Intermittent                [facility] PT Assessment - 09/24/20 0001       Assessment   Medical Diagnosis Primary osteoarthritis of left hip    Referring Provider (PT) Arther Abbott MD    Prior Therapy Yes      Observation/Other Assessments   Focus on Therapeutic Outcomes (FOTO)  43% function      Transfers   Five time sit to stand comments  11.95"      Ambulation/Gait   Ambulation Distance (Feet) 226 Feet    Assistive device None    Gait Pattern Decreased step length - right;Decreased step length - left    Ambulation Surface Level;Indoor    Gait Comments 2MWT (only able to complete 1 minute due to fatigue)                           [facility] Adult PT Treatment/Exercise - 09/24/20 0001       Knee/Hip Exercises: Standing  Heel Raises Both;1 set;15 reps    Hip Abduction Both;1 set;10 reps      Knee/Hip Exercises: Supine   Bridges 1 set;10 reps    Straight Leg Raises Both;2 sets;5 reps    Other Supine Knee/Hip Exercises glute set 10 x 5"    Other Supine Knee/Hip Exercises supine clam GTB 10 x 5"                      PT Short Term Goals - 09/24/20 1748       PT SHORT TERM GOAL #1   Title Patient will be independent with initial HEP and self-management strategies to improve functional outcomes    Baseline Variable compliance    Status On-going               PT Long Term Goals - 09/24/20 1749       PT LONG TERM GOAL #1   Title Patient will improve FOTO score by at least 5% to indicate improvement in functional outcomes    Baseline Decreased 6%    Status On-going      PT LONG TERM GOAL #2   Title Patient will report at least 70% overall improvement in subjective complaint to indicate improvement in ability to perform ADLs.    Baseline Reports no improvement at present    Status On-going      PT LONG TERM GOAL #3   Title  Patient will be able to perform stand x 5 in < 15 seconds to demonstrate improvement in functional mobility and reduced risk for falls.    Baseline Current 11.9 sec    Status Achieved      PT LONG TERM GOAL #4   Title Patient will be able to ambulate at least 300 feet during 2MWT with LRAD to demonstrate improved ability to perform functional mobility and associated tasks.    Baseline Current 226 feet with no AD in 1 min, unable to complete 2 MWT due to fatigue    Status On-going                   Plan - 09/24/20 1755     Clinical Impression Statement Patient showing slow progress toward therapy goals. Patient attendance has been poor to this point, but she has been dealing with some issues with family and at work/ scheduling. Patient educated on importance of compliance with regular HEP for max benefit and functional outcomes. Patient remains significantly limited in functional abilities by fatigue. At this time, patient would benefit from continued therapy services to progress hip and core strength as well as overall activity tolerance for decreased pain and improve functional ability.    Examination-Activity Limitations Stand;Transfers;Stairs;Locomotion Level    Examination-Participation Restrictions Community Activity;Occupation;Cleaning    Stability/Clinical Decision Making Stable/Uncomplicated    Rehab Potential Good    PT Frequency 2x / week    PT Duration 4 weeks    PT Treatment/Interventions ADLs/Self Care Home Management;Aquatic Therapy;Biofeedback;Cryotherapy;Electrical Stimulation;Stair training;Functional mobility training;Orthotic Fit/Training;Therapeutic activities;Manual techniques;Therapeutic exercise;Moist Heat;Iontophoresis 4mg /ml Dexamethasone;Traction;Balance training;Manual lymph drainage;Vestibular;Vasopneumatic Device;Taping;Splinting;Energy conservation;Dry needling;Joint Manipulations;Spinal Manipulations;Passive range of motion;Patient/family education;DME  Instruction;Gait training;Contrast Bath;Fluidtherapy;Parrafin;Ultrasound;Neuromuscular re-education;Compression bandaging;Visual/perceptual remediation/compensation;Scar mobilization    PT Next Visit Plan Progress glute and quad strength as tolerated. Progress to gait and balance activity when able. Add sit to stand, heel raise, also gait for conditioning.    PT Home Exercise Plan Eval: glute set, iso hip abduction/ adduction 5/12 standing hip abduciton, SLR; 5/31: sidestep with support and heel  raises.    Consulted and Agree with Plan of Care Patient             Patient will benefit from skilled therapeutic intervention in order to improve the following deficits and impairments:  Abnormal gait, Decreased activity tolerance, Decreased balance, Decreased range of motion, Difficulty walking, Pain, Decreased strength  Visit Diagnosis: Pain in left hip  Other abnormalities of gait and mobility     Problem List Patient Active Problem List   Diagnosis Date Noted   Special screening for malignant neoplasms, colon    Primary osteoarthritis of right knee 01/22/2014   Gout of big toe 07/24/2013   Osteoarthritis of right knee 07/24/2013   OA (osteoarthritis) of knee 06/20/2012   Effusion of knee joint 05/16/2012   Arthritis of knee, degenerative 05/16/2012   Knee pain 05/16/2012   KNEE PAIN 06/17/2009   COUGH 02/12/2009   HIP, ARTHRITIS, DEGEN./OSTEO 12/06/2007   HYPERLIPIDEMIA 04/18/2007   OBESITY, TRUNCAL 04/18/2007   TOBACCO USER 04/18/2007   HYPERTENSION 04/18/2007   ALLERGIC RHINITIS 04/18/2007   PULMONARY NODULE 04/18/2007   DYSPNEA 04/18/2007   6:09 PM, 09/24/20 Josue Hector PT DPT  Physical Therapist with [facility]  [phone] [facility] [address] [place], [place], [address] Phone: [phone]   Fax:  [phone]  Name: Jaia Alonge MRN: [id number] Date of Birth:  [birth year]

## 2020-09-29 ENCOUNTER — Encounter: Payer: Self-pay | Admitting: Nutrition

## 2020-09-30 ENCOUNTER — Other Ambulatory Visit: Payer: Self-pay

## 2020-09-30 ENCOUNTER — Ambulatory Visit (HOSPITAL_COMMUNITY): Payer: Medicare Other | Admitting: Physical Therapy

## 2020-09-30 ENCOUNTER — Encounter (HOSPITAL_COMMUNITY): Payer: Self-pay | Admitting: Physical Therapy

## 2020-09-30 DIAGNOSIS — M25552 Pain in left hip: Secondary | ICD-10-CM | POA: Diagnosis not present

## 2020-09-30 DIAGNOSIS — R2689 Other abnormalities of gait and mobility: Secondary | ICD-10-CM | POA: Diagnosis not present

## 2020-09-30 NOTE — Therapy (Signed)
[facility] [address] [place], [place], [address] Phone: [phone]   Fax:  [phone]  Physical Therapy Treatment  Patient Details  Name: Jennifer Bennett MRN: [id number] Date of Birth: 10/19/1945 Referring Provider (PT): Arther Abbott MD   Encounter Date: 09/30/2020   PT End of Session - 09/30/20 1744     Visit Number 5    Number of Visits 12    Date for PT Re-Evaluation 10/22/20    Authorization Type [employer] Medicare    Progress Note Due on Visit 8    PT Start Time 1740    PT Stop Time 1818    PT Time Calculation (min) 38 min    Activity Tolerance Patient tolerated treatment well;Patient limited by fatigue    Behavior During Therapy WFL for tasks assessed/performed             Past Medical History:  Diagnosis Date   Acid reflux    Arthritis    Eczema    High blood pressure    High cholesterol     Past Surgical History:  Procedure Laterality Date   ANAL FISSURECTOMY     BACK SURGERY  Lumbar   CARPAL TUNNEL RELEASE Right    COLONOSCOPY N/A 02/21/2017   Procedure: COLONOSCOPY;  Surgeon: Danie Binder, MD;  [facility];  Service: Endoscopy;  Laterality: N/A;  8:30 am - patient refused to move up at all   [place]      There were no vitals filed for this visit.   Subjective Assessment - 09/30/20 1744     Subjective "having a bad day". Having some trouble on LT hip and have a headache today.    Pertinent History RT THA, lumbar fusion    Patient Stated Goals Help with weight loss, walk better, improve stairs    Currently in Pain? Yes    Pain Score 7     Pain Location Hip    Pain Orientation Left    Pain Descriptors / Indicators Aching    Pain Type Chronic pain    Pain Onset More than a month ago    Pain Frequency Intermittent                               [facility] Adult PT Treatment/Exercise - 09/30/20 0001        Knee/Hip Exercises: Standing   Heel Raises Both;2 sets;10 reps    Forward Step Up 1 set;10 reps;Hand Hold: 1;Step Height: 2"    Other Standing Knee Exercises clinic ambulation for conditioning 2RT ( 1 pre and 1 post    Other Standing Knee Exercises sidestepping at mat 6 RT      Manual Therapy   Manual Therapy Soft tissue mobilization    Manual therapy comments completed separate from all other activity    Soft tissue mobilization STM using theragun Lv 10 to LT hip/ ITB/ lateral quad, patient in sidelying                      PT Short Term Goals - 09/24/20 1748       PT SHORT TERM GOAL #1   Title Patient will be independent with initial HEP and self-management strategies to improve functional outcomes    Baseline Variable compliance    Status On-going  PT Long Term Goals - 09/24/20 1749       PT LONG TERM GOAL #1   Title Patient will improve FOTO score by at least 5% to indicate improvement in functional outcomes    Baseline Decreased 6%    Status On-going      PT LONG TERM GOAL #2   Title Patient will report at least 70% overall improvement in subjective complaint to indicate improvement in ability to perform ADLs.    Baseline Reports no improvement at present    Status On-going      PT LONG TERM GOAL #3   Title Patient will be able to perform stand x 5 in < 15 seconds to demonstrate improvement in functional mobility and reduced risk for falls.    Baseline Current 11.9 sec    Status Achieved      PT LONG TERM GOAL #4   Title Patient will be able to ambulate at least 300 feet during 2MWT with LRAD to demonstrate improved ability to perform functional mobility and associated tasks.    Baseline Current 226 feet with no AD in 1 min, unable to complete 2 MWT due to fatigue    Status On-going                   Plan - 09/30/20 1818     Clinical Impression Statement Patient tolerated session well overall today. She continues to be  limited by fatigue with activity but was able to increase reps and performed 2 rounds of clinic ambulation throughout session for physical conditioning. Added step ups for LE strength. Patient will continue to benefit from skilled therapy services to progress hip strength and conditioning for reduced pain and improved functional mobility.    Examination-Activity Limitations Stand;Transfers;Stairs;Locomotion Level    Examination-Participation Restrictions Community Activity;Occupation;Cleaning    Stability/Clinical Decision Making Stable/Uncomplicated    Rehab Potential Good    PT Frequency 2x / week    PT Duration 4 weeks    PT Treatment/Interventions ADLs/Self Care Home Management;Aquatic Therapy;Biofeedback;Cryotherapy;Electrical Stimulation;Stair training;Functional mobility training;Orthotic Fit/Training;Therapeutic activities;Manual techniques;Therapeutic exercise;Moist Heat;Iontophoresis 4mg /ml Dexamethasone;Traction;Balance training;Manual lymph drainage;Vestibular;Vasopneumatic Device;Taping;Splinting;Energy conservation;Dry needling;Joint Manipulations;Spinal Manipulations;Passive range of motion;Patient/family education;DME Instruction;Gait training;Contrast Bath;Fluidtherapy;Parrafin;Ultrasound;Neuromuscular re-education;Compression bandaging;Visual/perceptual remediation/compensation;Scar mobilization    PT Next Visit Plan Progress glute and quad strength as tolerated. Progress to gait and balance activity when able. Add sit to stand, heel raise, also gait for conditioning.    PT Home Exercise Plan Eval: glute set, iso hip abduction/ adduction 5/12 standing hip abduciton, SLR; 5/31: sidestep with support and heel raises.    Consulted and Agree with Plan of Care Patient             Patient will benefit from skilled therapeutic intervention in order to improve the following deficits and impairments:  Abnormal gait, Decreased activity tolerance, Decreased balance, Decreased range of  motion, Difficulty walking, Pain, Decreased strength  Visit Diagnosis: Pain in left hip  Other abnormalities of gait and mobility     Problem List Patient Active Problem List   Diagnosis Date Noted   Special screening for malignant neoplasms, colon    Primary osteoarthritis of right knee 01/22/2014   Gout of big toe 07/24/2013   Osteoarthritis of right knee 07/24/2013   OA (osteoarthritis) of knee 06/20/2012   Effusion of knee joint 05/16/2012   Arthritis of knee, degenerative 05/16/2012   Knee pain 05/16/2012   KNEE PAIN 06/17/2009   COUGH 02/12/2009   HIP, ARTHRITIS, DEGEN./OSTEO 12/06/2007   HYPERLIPIDEMIA 04/18/2007  OBESITY, TRUNCAL 04/18/2007   TOBACCO USER 04/18/2007   HYPERTENSION 04/18/2007   ALLERGIC RHINITIS 04/18/2007   PULMONARY NODULE 04/18/2007   DYSPNEA 04/18/2007   6:23 PM, 09/30/20 Josue Hector PT DPT  Physical Therapist with [facility]  [phone] [place] [address] [place], [place], [address] Phone: [phone]   Fax:  [phone]  Name: Jennifer Bennett MRN: [id number] Date of Birth: 1945/07/24

## 2020-10-09 DIAGNOSIS — E785 Hyperlipidemia, unspecified: Secondary | ICD-10-CM | POA: Diagnosis not present

## 2020-10-09 DIAGNOSIS — E119 Type 2 diabetes mellitus without complications: Secondary | ICD-10-CM | POA: Diagnosis not present

## 2020-10-09 DIAGNOSIS — I10 Essential (primary) hypertension: Secondary | ICD-10-CM | POA: Diagnosis not present

## 2020-10-11 DIAGNOSIS — M159 Polyosteoarthritis, unspecified: Secondary | ICD-10-CM | POA: Diagnosis not present

## 2020-10-11 DIAGNOSIS — Z720 Tobacco use: Secondary | ICD-10-CM | POA: Diagnosis not present

## 2020-10-11 DIAGNOSIS — M1712 Unilateral primary osteoarthritis, left knee: Secondary | ICD-10-CM | POA: Diagnosis not present

## 2020-10-11 DIAGNOSIS — I10 Essential (primary) hypertension: Secondary | ICD-10-CM | POA: Diagnosis not present

## 2020-10-11 DIAGNOSIS — Z634 Disappearance and death of family member: Secondary | ICD-10-CM | POA: Diagnosis not present

## 2020-10-11 DIAGNOSIS — M1612 Unilateral primary osteoarthritis, left hip: Secondary | ICD-10-CM | POA: Diagnosis not present

## 2020-10-11 DIAGNOSIS — E785 Hyperlipidemia, unspecified: Secondary | ICD-10-CM | POA: Diagnosis not present

## 2020-10-22 ENCOUNTER — Other Ambulatory Visit: Payer: Self-pay

## 2020-10-22 ENCOUNTER — Other Ambulatory Visit (HOSPITAL_COMMUNITY): Payer: Self-pay | Admitting: Family Medicine

## 2020-10-22 ENCOUNTER — Ambulatory Visit (HOSPITAL_COMMUNITY)
Admission: RE | Admit: 2020-10-22 | Discharge: 2020-10-22 | Disposition: A | Discharge status: Home / Self Care | Payer: Medicare Other | Source: Ambulatory Visit | Attending: Family Medicine | Admitting: Family Medicine

## 2020-10-22 DIAGNOSIS — Z720 Tobacco use: Secondary | ICD-10-CM

## 2020-10-22 DIAGNOSIS — R0602 Shortness of breath: Secondary | ICD-10-CM | POA: Diagnosis not present

## 2020-10-22 DIAGNOSIS — I10 Essential (primary) hypertension: Secondary | ICD-10-CM | POA: Diagnosis not present

## 2020-10-22 DIAGNOSIS — J9811 Atelectasis: Secondary | ICD-10-CM | POA: Diagnosis not present

## 2020-10-22 DIAGNOSIS — I517 Cardiomegaly: Secondary | ICD-10-CM | POA: Diagnosis not present

## 2020-11-26 DIAGNOSIS — L308 Other specified dermatitis: Secondary | ICD-10-CM | POA: Diagnosis not present

## 2020-12-10 DIAGNOSIS — E785 Hyperlipidemia, unspecified: Secondary | ICD-10-CM | POA: Diagnosis not present

## 2020-12-10 DIAGNOSIS — E119 Type 2 diabetes mellitus without complications: Secondary | ICD-10-CM | POA: Diagnosis not present

## 2020-12-10 DIAGNOSIS — I10 Essential (primary) hypertension: Secondary | ICD-10-CM | POA: Diagnosis not present

## 2020-12-24 ENCOUNTER — Ambulatory Visit: Payer: Medicare Other | Admitting: Nutrition

## 2021-01-02 ENCOUNTER — Encounter: Payer: Self-pay | Admitting: Emergency Medicine

## 2021-01-02 ENCOUNTER — Ambulatory Visit
Admission: EM | Admit: 2021-01-02 | Discharge: 2021-01-02 | Disposition: A | Discharge status: Home / Self Care | Payer: Medicare Other | Attending: Emergency Medicine | Admitting: Emergency Medicine

## 2021-01-02 ENCOUNTER — Other Ambulatory Visit: Payer: Self-pay

## 2021-01-02 DIAGNOSIS — M25562 Pain in left knee: Secondary | ICD-10-CM | POA: Diagnosis not present

## 2021-01-02 MED ORDER — PREDNISONE 20 MG PO TABS: 20.0000 mg | ORAL_TABLET | Freq: Two times a day (BID) | ORAL | 0 refills | Status: AC

## 2021-01-02 NOTE — Discharge Instructions (Signed)
Continue conservative management of rest, ice, and gentle stretches Prednisone prescribed Follow up with orthopedist Return or go to the ER if you have any new or worsening symptoms (fever, chills, chest pain, redness, swelling bruising deformity etc...)

## 2021-01-02 NOTE — ED Triage Notes (Signed)
Seeing Dr. Aline Brochure for left hip pain.  Has had x-ray and had been doing therapy.  States she stopped therapy.  Left hip pain has gotten worse over the past few days.

## 2021-01-02 NOTE — ED Provider Notes (Signed)
[facility]   [id number] 01/02/21 Arrival Time: 8453  CC: LT knee pain  SUBJECTIVE: History from: patient. Jennifer Bennett is a 75 y.o. female complains of LT knee pain x few days.  Denies a precipitating event or specific injury.  Localizes the pain to the front of knee.  Describes the pain as intermittent 10/10  Has tried OTC medications without relief.  Symptoms are made worse with weight-bearing.  Denies similar symptoms in the past.  Denies fever, chills, erythema, ecchymosis, effusion, weakness.  ROS: As per HPI.  All other pertinent ROS negative.     Past Medical History:  Diagnosis Date   Acid reflux    Arthritis    Eczema    High blood pressure    High cholesterol    Past Surgical History:  Procedure Laterality Date   ANAL FISSURECTOMY     BACK SURGERY  Lumbar   CARPAL TUNNEL RELEASE Right    COLONOSCOPY N/A 02/21/2017   Procedure: COLONOSCOPY;  Surgeon: Danie Binder, MD;  [facility];  Service: Endoscopy;  Laterality: N/A;  8:30 am - patient refused to move up at all   HIP SURGERY Right    VESICOVAGINAL FISTULA CLOSURE W/ TAH     Allergies  Allergen Reactions   Ace Inhibitors Swelling    Swelling of lips/mouth   Advil [Ibuprofen]    Penicillins Other (See Comments)    Has patient had a PCN reaction causing immediate rash, facial/tongue/throat swelling, SOB or lightheadedness with hypotension: Unknown Has patient had a PCN reaction causing severe rash involving mucus membranes or skin necrosis: Unknown Has patient had a PCN reaction that required hospitalization: Unknown Has patient had a PCN reaction occurring within the last 10 years: Unknown Patient states is unaware of allergy If all of the above answers are "NO", then may proceed with Cephalosporin use.   No current facility-administered medications on file prior to encounter.   Current Outpatient Medications on File Prior to Encounter  Medication Sig Dispense Refill    acetaminophen (TYLENOL) 650 MG CR tablet Take 650-1,300 mg every 8 (eight) hours as needed by mouth for pain.     allopurinol (ZYLOPRIM) 100 MG tablet Take 100 mg by mouth daily.     aspirin EC 81 MG tablet Take 81 mg daily by mouth.     Aspirin-Salicylamide-Caffeine 646-803-21.2 MG PACK Take 1 packet daily as needed by mouth (for pain.).     cloNIDine (CATAPRES) 0.1 MG tablet Take 0.1 mg by mouth daily. (Patient not taking: Reported on 08/06/2020)     diclofenac (CATAFLAM) 50 MG tablet Take 1 tablet (50 mg total) by mouth 2 (two) times daily. 90 tablet 3   gemfibrozil (LOPID) 600 MG tablet Take 600 mg 2 (two) times daily by mouth.      hydrochlorothiazide (HYDRODIURIL) 25 MG tablet Take 25 mg by mouth daily.     HYDROcodone-acetaminophen (NORCO/VICODIN) 5-325 MG tablet Take 1 tablet daily as needed by mouth. For pain.     metoprolol (LOPRESSOR) 50 MG tablet Take 50 mg by mouth daily.      polyethylene glycol-electrolytes (TRILYTE) 420 g solution Take 4,000 mLs by mouth as directed. 4000 mL 0   Tetrahyd-Glyc-Hypro-PEG-ZnSulf (VISINE TOTALITY MULTI-SYMPTOM OP) Apply 1 drop 3 (three) times daily as needed to eye (for dry/irritated eyes.).     Social History   Socioeconomic History   Marital status: Widowed    Spouse name: Not on file   Number of children: Not on  file   Years of education: Not on file   Highest education level: Not on file  Occupational History   Not on file  Tobacco Use   Smoking status: Former    Packs/day: 0.50    Years: 52.00    Pack years: 26.00    Types: Cigarettes    Quit date: 06/25/2014    Years since quitting: 6.5   Smokeless tobacco: Never  Vaping Use   Vaping Use: Never used  Substance and Sexual Activity   Alcohol use: No   Drug use: No   Sexual activity: Not on file  Other Topics Concern   Not on file  Social History Narrative   Not on file   Social Determinants of Health   Financial Resource Strain: Not on file  Food Insecurity: Not on file   Transportation Needs: Not on file  Physical Activity: Not on file  Stress: Not on file  Social Connections: Not on file  Intimate Partner Violence: Not on file   Family History  Problem Relation Age of Onset   Cancer Other    Arthritis Other    Asthma Other    Heart disease Father    Cancer Brother     OBJECTIVE:  Vitals:   01/02/21 1256  BP: (!) 158/82  Pulse: 65  Resp: 18  Temp: 97.9 F (36.6 C)  TempSrc: Oral  SpO2: 94%    General appearance: ALERT; in no acute distress.  Head: NCAT Lungs: Normal respiratory effort Musculoskeletal: LT Knee Inspection: Skin warm, dry, clear and intact without obvious erythema, effusion, or ecchymosis.  Palpation: Nontender to palpation; unable to reproduce symptoms on exam ROM: FROM active and passive Strength: difficult to assess due to patient sitting in wheelchair Skin: warm and dry Neurologic: sitting in wheelchair Psychological: alert and cooperative; normal mood and affect   ASSESSMENT & PLAN:  1. Acute pain of left knee      Meds ordered this encounter  Medications   predniSONE (DELTASONE) 20 MG tablet    Sig: Take 1 tablet (20 mg total) by mouth 2 (two) times daily with a meal for 5 days.    Dispense:  10 tablet    Refill:  0    Order Specific Question:   Supervising Provider    Answer:   Raylene Everts [[id number]]    Continue conservative management of rest, ice, and gentle stretches Prednisone prescribed Follow up with orthopedist Return or go to the ER if you have any new or worsening symptoms (fever, chills, chest pain, redness, swelling bruising deformity etc...)   Reviewed expectations re: course of current medical issues. Questions answered. Outlined signs and symptoms indicating need for more acute intervention. Patient verbalized understanding. After Visit Summary given.     Lestine Box, PA-C 01/02/21 1323

## 2021-01-03 DIAGNOSIS — M1712 Unilateral primary osteoarthritis, left knee: Secondary | ICD-10-CM | POA: Diagnosis not present

## 2021-01-03 DIAGNOSIS — I10 Essential (primary) hypertension: Secondary | ICD-10-CM | POA: Diagnosis not present

## 2021-01-09 DIAGNOSIS — I10 Essential (primary) hypertension: Secondary | ICD-10-CM | POA: Diagnosis not present

## 2021-01-09 DIAGNOSIS — E785 Hyperlipidemia, unspecified: Secondary | ICD-10-CM | POA: Diagnosis not present

## 2021-01-09 DIAGNOSIS — E119 Type 2 diabetes mellitus without complications: Secondary | ICD-10-CM | POA: Diagnosis not present

## 2021-01-15 ENCOUNTER — Telehealth: Payer: Self-pay

## 2021-01-15 ENCOUNTER — Encounter: Payer: Self-pay | Admitting: Orthopedic Surgery

## 2021-01-15 ENCOUNTER — Other Ambulatory Visit: Payer: Self-pay

## 2021-01-15 ENCOUNTER — Ambulatory Visit: Payer: Medicare Other | Admitting: Orthopedic Surgery

## 2021-01-15 VITALS — BP 164/76 | HR 66 | Ht 62.5 in | Wt 230.0 lb

## 2021-01-15 DIAGNOSIS — Z6841 Body Mass Index (BMI) 40.0 and over, adult: Secondary | ICD-10-CM

## 2021-01-15 DIAGNOSIS — M1612 Unilateral primary osteoarthritis, left hip: Secondary | ICD-10-CM | POA: Diagnosis not present

## 2021-01-15 NOTE — Telephone Encounter (Signed)
Patient states that you and Dr. Aline Brochure talked with her about something for pain. She is just wondering if Dr. Aline Brochure is going to send in something for her at [facility]

## 2021-01-15 NOTE — Progress Notes (Signed)
Chief Complaint  Patient presents with   Leg Pain    States entire left leg painful at Groin and knee clicking and clacking for over a yr but getting worse.    75 year old female status post right total hip 15 to 20 years ago presents with progressively worsening pain in her left leg.  She says she woke up 2 weeks ago and could not walk.  She eventually was able to get onto a walker and get to urgent care.  They gave her some pain medication and she followed up with Dr. Berdine Addison who continued the pain medication and referred her to [place] for further evaluation.  We had seen her back in April when she had arthritis of the hip and her BMI was elevated over 43  She is continued with groin pain some of occasional pain which goes from her buttocks down her leg and across the front of the leg consistent with L4 radiculopathy  However this pain and giving way seems to be coming from the hip to the knee and down the front of the thigh  Her examination reveals an obese African-American female she is awake alert and oriented x3 mood and affect are normal she is pleasant very nice lady  She is ambulatory with a walker I had her stand up she had weakness in her left lower extremity as if it would give way  She did not have any back pain on palpation  Range of motion of the hip did not cause her significant discomfort neither did the knee.  She does complain of crepitance in the knee which was noted upon range of motion but there was no pain  She seemed to have normal strength and muscle tone skin was normal in the left leg she had no sensory deficits  Images show severe arthritis of the left hip mild arthritis with wide-open joint spaces in the left knee although the bone seems to have a deformity consistent with reactive disease  At this point however I told her that she needs to see the nutritionist and her primary care doctor to see if she can go on medication to lose weight  Her BMI is currently 41 she is  5 to 230 pounds  A goal weight would be at least 215pounds   Which would put her BMI under 40  She also wants me to fill out some work forms which will have to be done outside of clinic time  I will see her when she approaches the 215 weight at which time we can do her elective total hip

## 2021-01-19 NOTE — Telephone Encounter (Signed)
She is not taking the Tylenol #3 she states she did not think it was helping.  She was also not taking the Diclofenac, but has restarted. I told her the Diclofenac will take a few days or weeks to help, and to try them together see if that helps  She voiced understanding/  to Dr Waynette Buttery

## 2021-01-23 ENCOUNTER — Telehealth: Payer: Self-pay | Admitting: Orthopedic Surgery

## 2021-01-23 NOTE — Telephone Encounter (Signed)
Done

## 2021-01-23 NOTE — Telephone Encounter (Signed)
Patient had brought in a form  from employer [employer] -  Reasonable Accomodation Request form. It has come back to front office. I placed it back in Dr's box with office note attached for it to be reviewed and filled out, as [employer] does not complete these types of forms. Patient aware we will notify her once Dr has reviewed and responded.

## 2021-01-24 DIAGNOSIS — M1612 Unilateral primary osteoarthritis, left hip: Secondary | ICD-10-CM | POA: Diagnosis not present

## 2021-02-03 ENCOUNTER — Telehealth: Payer: Self-pay | Admitting: Orthopedic Surgery

## 2021-02-03 NOTE — Telephone Encounter (Signed)
Called patient to relay that her Accommodation Request form for employer [employer] has been completed by Dr Aline Brochure, and faxed per request to Fax [phone] / ph [phone] (copy scanned)

## 2021-02-09 DIAGNOSIS — I10 Essential (primary) hypertension: Secondary | ICD-10-CM | POA: Diagnosis not present

## 2021-02-09 DIAGNOSIS — E785 Hyperlipidemia, unspecified: Secondary | ICD-10-CM | POA: Diagnosis not present

## 2021-02-09 DIAGNOSIS — E119 Type 2 diabetes mellitus without complications: Secondary | ICD-10-CM | POA: Diagnosis not present

## 2021-02-16 ENCOUNTER — Telehealth: Payer: Self-pay | Admitting: Orthopedic Surgery

## 2021-02-16 NOTE — Telephone Encounter (Signed)
Patient called to relay she really needs to see Dr Aline Brochure again about her hip pain; said has lost some weight as last discussed. Said she's lost her job. Called back to patient and scheduled next available appointment.

## 2021-02-23 ENCOUNTER — Ambulatory Visit: Payer: Medicare Other

## 2021-02-23 ENCOUNTER — Ambulatory Visit (INDEPENDENT_AMBULATORY_CARE_PROVIDER_SITE_OTHER): Payer: Medicare Other | Admitting: Orthopedic Surgery

## 2021-02-23 ENCOUNTER — Other Ambulatory Visit: Payer: Self-pay

## 2021-02-23 ENCOUNTER — Encounter: Payer: Self-pay | Admitting: Orthopedic Surgery

## 2021-02-23 ENCOUNTER — Telehealth: Payer: Self-pay | Admitting: Orthopedic Surgery

## 2021-02-23 VITALS — Ht 62.5 in | Wt 215.2 lb

## 2021-02-23 DIAGNOSIS — M1612 Unilateral primary osteoarthritis, left hip: Secondary | ICD-10-CM

## 2021-02-23 MED ORDER — BUPIVACAINE-MELOXICAM ER 200-6 MG/7ML IJ SOLN: 400.0000 mg | Freq: Once | INTRAMUSCULAR | Status: DC

## 2021-02-23 NOTE — Telephone Encounter (Signed)
Isla called her insurance co and they said they will cover 20 days at a facility.  Please call the patient back.she still has some questions.

## 2021-02-23 NOTE — Progress Notes (Signed)
Chief Complaint  Patient presents with   Hip Pain    LT hip follow up Pain is worse. Pt was walking at last visit, today in wheelchair   75 year old female with hypertension worsening left hip pain status post lumbar fusion.  The patient is concerned that now she is having more pain and now she is unable to walk and has to ambulate in a wheelchair.  She was on Tylenol 3 her primary care doctor switched her over to hydrocodone 10 with little relief of pain  She has 2 primary areas of concern in terms of pain 1 is in the groin and thigh and one is in the buttock and lateral leg  She was able to lose the 15 pounds as we requested she went from 230 pounds down to 215 pounds decreasing her BMI to 38 from 71   Past Medical History:  Diagnosis Date   Acid reflux    Arthritis    Eczema    High blood pressure    High cholesterol     Current Outpatient Medications:    gemfibrozil (LOPID) 600 MG tablet, Take 600 mg 2 (two) times daily by mouth. , Disp: , Rfl:    hydrochlorothiazide (HYDRODIURIL) 25 MG tablet, Take 25 mg by mouth daily., Disp: , Rfl:    HYDROcodone-acetaminophen (NORCO) 10-325 MG tablet, Take 1 tablet by mouth every 6 (six) hours as needed., Disp: , Rfl:    metoprolol (LOPRESSOR) 50 MG tablet, Take 50 mg by mouth daily. , Disp: , Rfl:    allopurinol (ZYLOPRIM) 100 MG tablet, Take 100 mg by mouth daily., Disp: , Rfl:    aspirin EC 81 MG tablet, Take 81 mg daily by mouth., Disp: , Rfl:    Aspirin-Salicylamide-Caffeine 299-371-69.6 MG PACK, Take 1 packet daily as needed by mouth (for pain.)., Disp: , Rfl:    diclofenac (CATAFLAM) 50 MG tablet, Take 1 tablet (50 mg total) by mouth 2 (two) times daily. (Patient not taking: Reported on 02/23/2021), Disp: 90 tablet, Rfl: 3   polyethylene glycol-electrolytes (TRILYTE) 420 g solution, Take 4,000 mLs by mouth as directed. (Patient not taking: Reported on 02/23/2021), Disp: 4000 mL, Rfl: 0   Tetrahyd-Glyc-Hypro-PEG-ZnSulf (VISINE TOTALITY  MULTI-SYMPTOM OP), Apply 1 drop 3 (three) times daily as needed to eye (for dry/irritated eyes.)., Disp: , Rfl:   Past Surgical History:  Procedure Laterality Date   ANAL FISSURECTOMY     BACK SURGERY  Lumbar   CARPAL TUNNEL RELEASE Right    COLONOSCOPY N/A 02/21/2017   Procedure: COLONOSCOPY;  Surgeon: Danie Binder, MD;  [facility];  Service: Endoscopy;  Laterality: N/A;  8:30 am - patient refused to move up at all   HIP SURGERY Right    [place] W/ TAH      Physical Exam Constitutional:      General: She is not in acute distress.    Appearance: She is well-developed.     Comments: Well developed, well nourished Normal grooming and hygiene     Cardiovascular:     Comments: No peripheral edema Musculoskeletal:     Comments: Surprisingly minimal discomfort with hip flexion she does have pain and crepitance with abduction of the hip  The patient cannot really lie in a position for me to check leg lengths and this will have to be checked prior to surgery and the x-ray is inconclusive in terms of leg length due to obliquity  Back was nontender  Was able to flex the  hip past 90 degrees  Skin:    General: Skin is warm and dry.  Neurological:     Mental Status: She is alert and oriented to person, place, and time.     Sensory: No sensory deficit.     Coordination: Coordination normal.     Gait: Gait normal.     Deep Tendon Reflexes: Reflexes are normal and symmetric.  Psychiatric:        Mood and Affect: Mood normal.        Behavior: Behavior normal.        Thought Content: Thought content normal.        Judgment: Judgment normal.     Comments: Affect normal    New images were obtained.  The patient has a right total hip she has a lumbar fusion she has a severely arthritic left hip with deformity of the femoral head no acetabular abnormalities are seen.  Patient has Medicare advantage [facility] we asked her to call them to inquire about postop  rehabilitation services  Surgery scheduled for December 2  She has 2-3 steps to get in the house and the bedroom bathroom on the first floor  We ordered a walker  The procedure has been fully reviewed with the patient; The risks and benefits of surgery have been discussed and explained and understood. Alternative treatment has also been reviewed, questions were encouraged and answered. The postoperative plan is also been reviewed.  Plan is for a left total hip

## 2021-02-23 NOTE — Patient Instructions (Addendum)
SURGERY ON DEC 2ND   CALL YOUR INSURANCE CO -ASK THEM IF YOU CAN STAY IN A REHAB FACILITY AFTER SURGERY     Total Hip Replacement Total hip replacement is a surgical procedure to remove damaged bone in your hip joint and replace it with an artificial hip joint (prosthetic hip joint). The purpose of this surgery is to reduce pain and to improve your hip function. During a total hip replacement, one or both parts of the hip joint are replaced, depending on the type of joint damage you have. The hip is a ball-and-socket type of joint, and it has two main parts. The ball part of the joint (femoral head) is the top of the thigh bone (femur). The socket part of the joint is a large indent in the side of your pelvis (acetabulum) where the femur and pelvis meet. Tell a health care provider about: Any allergies you have. All medicines you are taking, including vitamins, herbs, eye drops, creams, and over-the-counter medicines. Any problems you or family members have had with anesthetic medicines. Any blood disorders you have. Any surgeries you have had. Any medical conditions you have. What are the risks? Generally, total hip replacement is a safe procedure. However, problems can occur, including: Infection. Dislocation (the ball of the hip-joint prosthesis comes out of contact with the socket). Loosening of the piece (stem) that connects the prosthetic femoral head to the femur. Fracture of the bone while inserting the prosthesis. Formation of blood clots, which can break loose and travel to and injure your lungs (pulmonary embolus).  What happens before the procedure? Plan to have someone take you home after the procedure. Do not eat or drink anything after midnight on the night before the procedure or as directed by your health care provider. Ask your health care provider about: Changing or stopping your regular medicines. This is especially important if you are taking diabetes medicines or  blood thinners. Taking medicines such as aspirin and ibuprofen. These medicines can thin your blood. Do not take these medicines before your procedure if your health care provider asks you not to. Ask your health care provider about how your surgical site will be marked or identified. You may be given antibiotic medicines to help prevent infection. What happens during the procedure? To reduce your risk of infection: Your health care team will wash or sanitize their hands. Your skin will be washed with soap. An IV tube will be inserted into one of your veins. You will be given one or more of the following: A medicine that makes you drowsy (sedative). A medicine that makes you fall asleep (general anesthetic). A medicine injected into your spine that numbs your body below the waist (spinal anesthetic). An incision will be made in your hip. Your surgeon will take out any damaged cartilage and bone. Your surgeon will then: Insert a prosthetic socket into the acetabulum of your pelvis. This is usually secured with screws. Remove the femoral head and replace it with a prosthetic ball and stem secured into the top of your femur. Place the ball into the socket and check the range of motion and stability of your new hip. Close the incision and apply a bandage over the surgical site. What happens after the procedure? You will stay in a recovery area until the medicines have worn off. Your vital signs, such as your pulse and blood pressure, will be monitored. Once you are awake and stable, you will be taken to a hospital room. You  may be directed to take actions to help prevent blood clots. These may include: Walking soon after surgery, with someone assisting you. Moving around after surgery helps to improve blood flow. Taking medicines to thin your blood (anticoagulants). Wearing compression stockings or using different types of devices. You will receive physical therapy until you are doing well and  your health care provider feels it is safe for you to go home. This information is not intended to replace advice given to you by your health care provider. Make sure you discuss any questions you have with your health care provider. Document Released: 07/05/2000 Document Revised: 12/01/2015 Document Reviewed: 05/30/2013 Elsevier Interactive Patient Education  2018 [place] have decided to proceed with hip replacement surgery. You have decided not to continue with nonoperative measures such as but not limited to oral medication, weight loss, activity modification, physical therapy, or injection.  We will perform the procedure commonly known as total hip replacement. Some of the risks associated with total hip replacement include but are not limited to Bleeding Infection Swelling Stiffness Blood clot Continued Pain Dislocation LEG LENGTH INEQUALITY REQUIRING A SHOE LIFT Persistent limp  Infection is a devastating complication of joint replacement surgery. If you get an infection the implant usually will have to be removed and several surgeries and antibiotics will be needed to eradicate the infection. In some rare cases the hip cannot be reconstructed with another implant. This will lead to permanent need for crutches and assistive devices to ambulate.   In compliance with recent [place] law in federal regulation regarding opioid use and abuse and addiction, we will taper (stop) opioid medication to avoid dependence and addiction. The goal is to have you on no opioid medication by week 4    If you're not comfortable with these risks and would like to continue with nonoperative treatment please let Dr. Aline Brochure know prior to your surgery. Your surgery will be at [facility] by Dr Aline Brochure expect to be in the hospital overnight. The hospital will contact you with a preoperative appointment to discuss Anesthesia. The number is [phone] Please bring your medications with you  for the appointment. They will tell you the arrival time and medication instructions when you have your preoperative evaluation. Do not wear nail polish the day of your surgery and if you take Phentermine you need to stop this medication ONE WEEK prior to your surgery.    You will have home physical therapy for 2 weeks after surgery, the home health agency will call you before or just following the surgery to set up visits. [facility] is the agency we normally use, unless you request another agency.   You will get a call also from outpatient therapy for therapy starting when the home therapy is done.  If you have questions or need to Reschedule the surgery, call the office ask for Amy.

## 2021-03-09 NOTE — Patient Instructions (Signed)
Jennifer Bennett [facility]  03/09/2021     @PREFPERIOPPHARMACY @   Your procedure is scheduled on  03/13/2021.   Report to [facility] at  [phone] A.M.   Call this number if you have problems the morning of surgery:  [phone]   Remember:  Do not eat or drink after midnight.      Take these medicines the morning of surgery with A SIP OF WATER       Allopurinol, hydrocodone (if needed), metoprolol.     Do not wear jewelry, make-up or nail polish.  Do not wear lotions, powders, or perfumes, or deodorant.  Do not shave 48 hours prior to surgery.  Men may shave face and neck.  Do not bring valuables to the hospital.  [facility] is not responsible for any belongings or valuables.  Contacts, dentures or bridgework may not be worn into surgery.  Leave your suitcase in the car.  After surgery it may be brought to your room.  For patients admitted to the hospital, discharge time will be determined by your treatment team.  Patients discharged the day of surgery will not be allowed to drive home and must have someone with them for 24 hours.    Special instructions:   DO NOT smoke tobacco or vape for 24 hours before your procedure.  Please read over the following fact sheets that you were given. Pain Booklet, Coughing and Deep Breathing, Blood Transfusion Information, Surgical Site Infection Prevention, Anesthesia Post-op Instructions, and Care and Recovery After Surgery      Total Hip Replacement, Posterior, Care After This sheet gives you information about how to care for yourself after your procedure. Your doctor may also give you more specific instructions. If you have problems or questions, contact your doctor. What can I expect after the procedure? Pain. Redness and swelling. Stiffness. Discomfort. A small amount of blood coming from your cut from surgery (incision). A small amount of clear fluid coming from your incision. Follow these instructions at  home: Medicines Take over-the-counter and prescription medicines only as told by your doctor. Take a medicine to thin your blood (anticoagulant) as told, if your doctor prescribed one. If told, take steps to prevent problems with pooping (constipation). You may need to: Drink enough fluid to keep your pee (urine) pale yellow. Take medicines. You will be told what medicines to take. Eat foods that are high in fiber. These include beans, whole grains, and fresh fruits and vegetables. Limit foods that are high in fat and sugar. These include fried or sweet foods. Ask your doctor if you should avoid driving or using machines while you are taking your medicine. Incision care  Follow instructions from your doctor about how to take care of your incision. Make sure you: Wash your hands with soap and water for at least 20 seconds before and after you change your bandage (dressing). If you cannot use soap and water, use hand sanitizer. Change your bandage as told by your doctor. Leave stitches (sutures), staples, skin glue, or skin tape (adhesive) strips in place. They may need to stay in place for 2 weeks or longer. If tape strips get loose and curl up, you may trim the loose edges. Do not take off tape strips completely unless your doctor tells you to do that. Do not take baths, swim, or use a hot tub until your doctor says it is okay. Check your incision every day for signs of infection. Check  for: More redness, swelling, or pain. More fluid or blood. Warmth. Pus or a bad smell. Managing pain, stiffness, and swelling  If told, put ice on the hip area. To do this: Put ice in a plastic bag. Place a towel between your skin and the bag. Leave the ice on for 20 minutes, 2-3 times a day. Take off the ice if your skin turns bright red. This is very important. If you cannot feel pain, heat, or cold, you have a greater risk of damage to the area. Move your toes often. Raise your leg above the level of  your heart while you are lying down. Activity Ask your doctor what activities are safe for you. Get up to take short walks every 1 to 2 hours. Ask for help if you feel weak or unsteady. Do exercises as told by your doctor. Do not use your legs to support (bear) your body weight until your doctor says that you can. Follow instructions about how much weight you may safely support on your injured leg (weight-bearing restrictions). Use a walker, crutches, or a cane as told by your doctor. Return to your normal activities when your doctor says that it is safe. Movement restrictions  Follow instructions from your doctor about how much you should let your hip move (movement restrictions). For example: Do not cross your legs at the knees. Try keeping a pillow between your legs when you lie in bed. Do not bend farther than 90 degrees at your hip and waist. To keep from bending this far: Do not bring your knees higher than your hips. Do not pick up something from the floor while sitting in a chair. Avoid sitting in low chairs. Use a raised toilet seat. When you start to stand up after sitting, keep your injured leg out in front of you. Avoid twisting at your waist and reaching across your body to the side of your injured leg. Avoid turning your toes inward. When getting into a car: Raise the seat as high as you can. Move the seat as far back as it will go. Recline the upper part of the seat slightly. Sit down onto the seat with your injured leg sticking out of the car. Scoot back in the seat as you move the lower half of your body into the car. Try to avoid bumping your foot or leg as you bring it into the car. Safety Keep floors clear of things you may trip over. Place items that you may need within easy reach. Wear an apron or tool belt with pockets for carrying things. General instructions Ask your doctor when it is safe to drive. Wear compression stockings as told by your doctor. Keep  doing breathing exercises as told. Do not smoke or use any products that contain nicotine or tobacco. If you need help quitting, ask your doctor. If you plan to visit a dentist: Tell your doctor. Ask about things to do before your teeth are cleaned. Tell your dentist about your new joint. Keep all follow-up visits. Contact a doctor if: You have a fever or chills. You have a cough. Your medicine does not help your pain. You have any of these signs of infection in your incision: More redness, swelling, or pain. More fluid or blood. Warmth. Pus or a bad smell. Get help right away if: You have very bad pain. You feel short of breath. You have trouble breathing. You have chest pain. You have redness, swelling, pain, or warmth in your leg  or in the back of your lower leg (calf). Your incision opens up after stitches or staples are taken out. These symptoms may be an emergency. Get help right away. Call your local emergency services (911 in the [place] Do not wait to see if the symptoms will go away. Do not drive yourself to the hospital. Summary Do not use your legs to support your body weight until your doctor says that you can. Use a walker, crutches, or a cane as told. Follow instructions from your doctor about how much you can let your hip move. Keep all follow-up visits. This information is not intended to replace advice given to you by your health care provider. Make sure you discuss any questions you have with your health care provider. Document Revised: 08/23/2019 Document Reviewed: 08/23/2019 Elsevier Patient Education  [place] Anesthesia, Adult, Care After This sheet gives you information about how to care for yourself after your procedure. Your health care provider may also give you more specific instructions. If you have problems or questions, contact your health care provider. What can I expect after the procedure? After the procedure, the following side  effects are common: Pain or discomfort at the IV site. Nausea. Vomiting. Sore throat. Trouble concentrating. Feeling cold or chills. Feeling weak or tired. Sleepiness and fatigue. Soreness and body aches. These side effects can affect parts of the body that were not involved in surgery. Follow these instructions at home: For the time period you were told by your health care provider:  Rest. Do not participate in activities where you could fall or become injured. Do not drive or use machinery. Do not drink alcohol. Do not take sleeping pills or medicines that cause drowsiness. Do not make important decisions or sign legal documents. Do not take care of children on your own. Eating and drinking Follow any instructions from your health care provider about eating or drinking restrictions. When you feel hungry, start by eating small amounts of foods that are soft and easy to digest (bland), such as toast. Gradually return to your regular diet. Drink enough fluid to keep your urine pale yellow. If you vomit, rehydrate by drinking water, juice, or clear broth. General instructions If you have sleep apnea, surgery and certain medicines can increase your risk for breathing problems. Follow instructions from your health care provider about wearing your sleep device: Anytime you are sleeping, including during daytime naps. While taking prescription pain medicines, sleeping medicines, or medicines that make you drowsy. Have a responsible adult stay with you for the time you are told. It is important to have someone help care for you until you are awake and alert. Return to your normal activities as told by your health care provider. Ask your health care provider what activities are safe for you. Take over-the-counter and prescription medicines only as told by your health care provider. If you smoke, do not smoke without supervision. Keep all follow-up visits as told by your health care provider.  This is important. Contact a health care provider if: You have nausea or vomiting that does not get better with medicine. You cannot eat or drink without vomiting. You have pain that does not get better with medicine. You are unable to pass urine. You develop a skin rash. You have a fever. You have redness around your IV site that gets worse. Get help right away if: You have difficulty breathing. You have chest pain. You have blood in your urine or stool, or you  vomit blood. Summary After the procedure, it is common to have a sore throat or nausea. It is also common to feel tired. Have a responsible adult stay with you for the time you are told. It is important to have someone help care for you until you are awake and alert. When you feel hungry, start by eating small amounts of foods that are soft and easy to digest (bland), such as toast. Gradually return to your regular diet. Drink enough fluid to keep your urine pale yellow. Return to your normal activities as told by your health care provider. Ask your health care provider what activities are safe for you. This information is not intended to replace advice given to you by your health care provider. Make sure you discuss any questions you have with your health care provider. Document Revised: 12/13/2019 Document Reviewed: 07/12/2019 Elsevier Patient Education  2022 [employer]. How to Use Chlorhexidine for Bathing Chlorhexidine gluconate (CHG) is a germ-killing (antiseptic) solution that is used to clean the skin. It can get rid of the bacteria that normally live on the skin and can keep them away for about 24 hours. To clean your skin with CHG, you may be given: A CHG solution to use in the shower or as part of a sponge bath. A prepackaged cloth that contains CHG. Cleaning your skin with CHG may help lower the risk for infection: While you are staying in the intensive care unit of the hospital. If you have a vascular access, such  as a central line, to provide short-term or long-term access to your veins. If you have a catheter to drain urine from your bladder. If you are on a ventilator. A ventilator is a machine that helps you breathe by moving air in and out of your lungs. After surgery. What are the risks? Risks of using CHG include: A skin reaction. Hearing loss, if CHG gets in your ears and you have a perforated eardrum. Eye injury, if CHG gets in your eyes and is not rinsed out. The CHG product catching fire. Make sure that you avoid smoking and flames after applying CHG to your skin. Do not use CHG: If you have a chlorhexidine allergy or have previously reacted to chlorhexidine. On babies younger than 73 months of age. How to use CHG solution Use CHG only as told by your health care provider, and follow the instructions on the label. Use the full amount of CHG as directed. Usually, this is one bottle. During a shower Follow these steps when using CHG solution during a shower (unless your health care provider gives you different instructions): Start the shower. Use your normal soap and shampoo to wash your face and hair. Turn off the shower or move out of the shower stream. Pour the CHG onto a clean washcloth. Do not use any type of brush or rough-edged sponge. Starting at your neck, lather your body down to your toes. Make sure you follow these instructions: If you will be having surgery, pay special attention to the part of your body where you will be having surgery. Scrub this area for at least 1 minute. Do not use CHG on your head or face. If the solution gets into your ears or eyes, rinse them well with water. Avoid your genital area. Avoid any areas of skin that have broken skin, cuts, or scrapes. Scrub your back and under your arms. Make sure to wash skin folds. Let the lather sit on your skin for 1-2 minutes  or as long as told by your health care provider. Thoroughly rinse your entire body in the  shower. Make sure that all body creases and crevices are rinsed well. Dry off with a clean towel. Do not put any substances on your body afterward--such as powder, lotion, or perfume--unless you are told to do so by your health care provider. Only use lotions that are recommended by the manufacturer. Put on clean clothes or pajamas. If it is the night before your surgery, sleep in clean sheets.  During a sponge bath Follow these steps when using CHG solution during a sponge bath (unless your health care provider gives you different instructions): Use your normal soap and shampoo to wash your face and hair. Pour the CHG onto a clean washcloth. Starting at your neck, lather your body down to your toes. Make sure you follow these instructions: If you will be having surgery, pay special attention to the part of your body where you will be having surgery. Scrub this area for at least 1 minute. Do not use CHG on your head or face. If the solution gets into your ears or eyes, rinse them well with water. Avoid your genital area. Avoid any areas of skin that have broken skin, cuts, or scrapes. Scrub your back and under your arms. Make sure to wash skin folds. Let the lather sit on your skin for 1-2 minutes or as long as told by your health care provider. Using a different clean, wet washcloth, thoroughly rinse your entire body. Make sure that all body creases and crevices are rinsed well. Dry off with a clean towel. Do not put any substances on your body afterward--such as powder, lotion, or perfume--unless you are told to do so by your health care provider. Only use lotions that are recommended by the manufacturer. Put on clean clothes or pajamas. If it is the night before your surgery, sleep in clean sheets. How to use CHG prepackaged cloths Only use CHG cloths as told by your health care provider, and follow the instructions on the label. Use the CHG cloth on clean, dry skin. Do not use the CHG  cloth on your head or face unless your health care provider tells you to. When washing with the CHG cloth: Avoid your genital area. Avoid any areas of skin that have broken skin, cuts, or scrapes. Before surgery Follow these steps when using a CHG cloth to clean before surgery (unless your health care provider gives you different instructions): Using the CHG cloth, vigorously scrub the part of your body where you will be having surgery. Scrub using a back-and-forth motion for 3 minutes. The area on your body should be completely wet with CHG when you are done scrubbing. Do not rinse. Discard the cloth and let the area air-dry. Do not put any substances on the area afterward, such as powder, lotion, or perfume. Put on clean clothes or pajamas. If it is the night before your surgery, sleep in clean sheets.  For general bathing Follow these steps when using CHG cloths for general bathing (unless your health care provider gives you different instructions). Use a separate CHG cloth for each area of your body. Make sure you wash between any folds of skin and between your fingers and toes. Wash your body in the following order, switching to a new cloth after each step: The front of your neck, shoulders, and chest. Both of your arms, under your arms, and your hands. Your stomach and groin area, avoiding  the genitals. Your right leg and foot. Your left leg and foot. The back of your neck, your back, and your buttocks. Do not rinse. Discard the cloth and let the area air-dry. Do not put any substances on your body afterward--such as powder, lotion, or perfume--unless you are told to do so by your health care provider. Only use lotions that are recommended by the manufacturer. Put on clean clothes or pajamas. Contact a health care provider if: Your skin gets irritated after scrubbing. You have questions about using your solution or cloth. You swallow any chlorhexidine. Call your local poison control  center (1-7877961236 in the [place] Get help right away if: Your eyes itch badly, or they become very red or swollen. Your skin itches badly and is red or swollen. Your hearing changes. You have trouble seeing. You have swelling or tingling in your mouth or throat. You have trouble breathing. These symptoms may represent a serious problem that is an emergency. Do not wait to see if the symptoms will go away. Get medical help right away. Call your local emergency services (911 in the [place] Do not drive yourself to the hospital. Summary Chlorhexidine gluconate (CHG) is a germ-killing (antiseptic) solution that is used to clean the skin. Cleaning your skin with CHG may help to lower your risk for infection. You may be given CHG to use for bathing. It may be in a bottle or in a prepackaged cloth to use on your skin. Carefully follow your health care provider's instructions and the instructions on the product label. Do not use CHG if you have a chlorhexidine allergy. Contact your health care provider if your skin gets irritated after scrubbing. This information is not intended to replace advice given to you by your health care provider. Make sure you discuss any questions you have with your health care provider. Document Revised: 06/09/2020 Document Reviewed: 06/09/2020 Elsevier Patient Education  2022 [employer].

## 2021-03-10 ENCOUNTER — Encounter (HOSPITAL_COMMUNITY): Payer: Self-pay

## 2021-03-10 ENCOUNTER — Encounter (HOSPITAL_COMMUNITY)
Admission: RE | Admit: 2021-03-10 | Discharge: 2021-03-10 | Disposition: A | Discharge status: Home / Self Care | Payer: Medicare Other | Source: Ambulatory Visit | Attending: Orthopedic Surgery | Admitting: Orthopedic Surgery

## 2021-03-10 ENCOUNTER — Other Ambulatory Visit: Payer: Self-pay

## 2021-03-10 ENCOUNTER — Other Ambulatory Visit (HOSPITAL_COMMUNITY)
Admission: RE | Admit: 2021-03-10 | Discharge: 2021-03-10 | Disposition: A | Discharge status: Home / Self Care | Payer: Medicare Other | Source: Ambulatory Visit | Attending: Orthopedic Surgery | Admitting: Orthopedic Surgery

## 2021-03-10 VITALS — BP 113/80 | HR 65 | Temp 98.6°F | Resp 18 | Ht 62.5 in | Wt 215.2 lb

## 2021-03-10 DIAGNOSIS — M24152 Other articular cartilage disorders, left hip: Secondary | ICD-10-CM | POA: Diagnosis not present

## 2021-03-10 DIAGNOSIS — Z20822 Contact with and (suspected) exposure to covid-19: Secondary | ICD-10-CM | POA: Insufficient documentation

## 2021-03-10 DIAGNOSIS — Z7982 Long term (current) use of aspirin: Secondary | ICD-10-CM | POA: Diagnosis not present

## 2021-03-10 DIAGNOSIS — Z79899 Other long term (current) drug therapy: Secondary | ICD-10-CM | POA: Diagnosis not present

## 2021-03-10 DIAGNOSIS — M659 Synovitis and tenosynovitis, unspecified: Secondary | ICD-10-CM | POA: Diagnosis not present

## 2021-03-10 DIAGNOSIS — Z87891 Personal history of nicotine dependence: Secondary | ICD-10-CM | POA: Diagnosis not present

## 2021-03-10 DIAGNOSIS — I10 Essential (primary) hypertension: Secondary | ICD-10-CM | POA: Diagnosis not present

## 2021-03-10 DIAGNOSIS — Z419 Encounter for procedure for purposes other than remedying health state, unspecified: Secondary | ICD-10-CM | POA: Insufficient documentation

## 2021-03-10 DIAGNOSIS — Z6841 Body Mass Index (BMI) 40.0 and over, adult: Secondary | ICD-10-CM | POA: Diagnosis not present

## 2021-03-10 DIAGNOSIS — Z01818 Encounter for other preprocedural examination: Secondary | ICD-10-CM | POA: Insufficient documentation

## 2021-03-10 DIAGNOSIS — K219 Gastro-esophageal reflux disease without esophagitis: Secondary | ICD-10-CM | POA: Diagnosis not present

## 2021-03-10 DIAGNOSIS — M1612 Unilateral primary osteoarthritis, left hip: Secondary | ICD-10-CM

## 2021-03-10 DIAGNOSIS — Z96641 Presence of right artificial hip joint: Secondary | ICD-10-CM | POA: Diagnosis not present

## 2021-03-10 DIAGNOSIS — D62 Acute posthemorrhagic anemia: Secondary | ICD-10-CM | POA: Diagnosis not present

## 2021-03-10 DIAGNOSIS — I9581 Postprocedural hypotension: Secondary | ICD-10-CM | POA: Diagnosis not present

## 2021-03-10 DIAGNOSIS — E785 Hyperlipidemia, unspecified: Secondary | ICD-10-CM | POA: Diagnosis not present

## 2021-03-10 DIAGNOSIS — E7800 Pure hypercholesterolemia, unspecified: Secondary | ICD-10-CM | POA: Diagnosis not present

## 2021-03-10 DIAGNOSIS — E669 Obesity, unspecified: Secondary | ICD-10-CM | POA: Diagnosis present

## 2021-03-10 DIAGNOSIS — Z888 Allergy status to other drugs, medicaments and biological substances status: Secondary | ICD-10-CM | POA: Diagnosis not present

## 2021-03-10 DIAGNOSIS — Z981 Arthrodesis status: Secondary | ICD-10-CM | POA: Diagnosis not present

## 2021-03-10 DIAGNOSIS — E119 Type 2 diabetes mellitus without complications: Secondary | ICD-10-CM | POA: Diagnosis not present

## 2021-03-10 LAB — CBC WITH DIFFERENTIAL/PLATELET
Abs Immature Granulocytes: 0.04 10*3/uL (ref 0.00–0.07)
Basophils Absolute: 0.1 10*3/uL (ref 0.0–0.1)
Basophils Relative: 1 %
Eosinophils Absolute: 0.1 10*3/uL (ref 0.0–0.5)
Eosinophils Relative: 2 %
HCT: 45.8 % (ref 36.0–46.0)
Hemoglobin: 14 g/dL (ref 12.0–15.0)
Immature Granulocytes: 1 %
Lymphocytes Relative: 30 %
Lymphs Abs: 1.9 10*3/uL (ref 0.7–4.0)
MCH: 26.5 pg (ref 26.0–34.0)
MCHC: 30.6 g/dL (ref 30.0–36.0)
MCV: 86.6 fL (ref 80.0–100.0)
Monocytes Absolute: 0.6 10*3/uL (ref 0.1–1.0)
Monocytes Relative: 9 %
Neutro Abs: 3.7 10*3/uL (ref 1.7–7.7)
Neutrophils Relative %: 57 %
Platelets: 382 10*3/uL (ref 150–400)
RBC: 5.29 MIL/uL — ABNORMAL HIGH (ref 3.87–5.11)
RDW: 13.2 % (ref 11.5–15.5)
WBC: 6.3 10*3/uL (ref 4.0–10.5)
nRBC: 0 % (ref 0.0–0.2)

## 2021-03-10 LAB — BASIC METABOLIC PANEL
Anion gap: 10 (ref 5–15)
BUN: 17 mg/dL (ref 8–23)
CO2: 28 mmol/L (ref 22–32)
Calcium: 10.4 mg/dL — ABNORMAL HIGH (ref 8.9–10.3)
Chloride: 102 mmol/L (ref 98–111)
Creatinine, Ser: 0.84 mg/dL (ref 0.44–1.00)
GFR, Estimated: >60 mL/min (ref >60)
Glucose, Bld: 101 mg/dL — ABNORMAL HIGH (ref 70–99)
Potassium: 4.2 mmol/L (ref 3.5–5.1)
Sodium: 140 mmol/L (ref 135–145)

## 2021-03-10 LAB — PREPARE RBC (CROSSMATCH)

## 2021-03-10 NOTE — Progress Notes (Signed)
Patient had 2 sleep studies in the past and both were negative.

## 2021-03-11 ENCOUNTER — Telehealth: Payer: Self-pay | Admitting: Orthopedic Surgery

## 2021-03-11 ENCOUNTER — Other Ambulatory Visit: Payer: Self-pay | Admitting: Radiology

## 2021-03-11 ENCOUNTER — Other Ambulatory Visit: Payer: Self-pay | Admitting: Orthopedic Surgery

## 2021-03-11 DIAGNOSIS — M1612 Unilateral primary osteoarthritis, left hip: Secondary | ICD-10-CM

## 2021-03-11 DIAGNOSIS — E119 Type 2 diabetes mellitus without complications: Secondary | ICD-10-CM | POA: Diagnosis not present

## 2021-03-11 DIAGNOSIS — E785 Hyperlipidemia, unspecified: Secondary | ICD-10-CM | POA: Diagnosis not present

## 2021-03-11 DIAGNOSIS — I10 Essential (primary) hypertension: Secondary | ICD-10-CM | POA: Diagnosis not present

## 2021-03-11 LAB — SARS CORONAVIRUS 2 (TAT 6-24 HRS): SARS Coronavirus 2: NEGATIVE

## 2021-03-11 NOTE — Addendum Note (Signed)
Addended byCandice Camp on: 03/11/2021 01:24 PM   Modules accepted: Orders

## 2021-03-11 NOTE — Telephone Encounter (Signed)
Jennifer Bennett at [facility] called  and left a voicemail and states they do not have the authorization for her surgery DOS 03/13/21.  She needs someone to call her back.  Jennifer Bennett  [phone]  EXT [phone]

## 2021-03-12 NOTE — H&P (Signed)
History and physical for admission for left total hip   Chief Complaint  Patient presents with   Hip Pain      LT hip follow up Pain is worse. Pt was walking at last visit, today in wheelchair    75 year old female with hypertension worsening left hip pain status post lumbar fusion.  The patient is concerned that now she is having more pain and now she is unable to walk and has to ambulate in a wheelchair.  She was on Tylenol 3 her primary care doctor switched her over to hydrocodone 10 with little relief of pain  She has 2 primary areas of concern in terms of pain 1 is in the groin and thigh and one is in the buttock and lateral leg   She was able to lose the 15 pounds as we requested she went from 230 pounds down to 215 pounds decreasing her BMI to 38 from 71         Past Medical History:  Diagnosis Date   Acid reflux     Arthritis     Eczema     High blood pressure     High cholesterol        Current Outpatient Medications:    gemfibrozil (LOPID) 600 MG tablet, Take 600 mg 2 (two) times daily by mouth. , Disp: , Rfl:    hydrochlorothiazide (HYDRODIURIL) 25 MG tablet, Take 25 mg by mouth daily., Disp: , Rfl:    HYDROcodone-acetaminophen (NORCO) 10-325 MG tablet, Take 1 tablet by mouth every 6 (six) hours as needed., Disp: , Rfl:    metoprolol (LOPRESSOR) 50 MG tablet, Take 50 mg by mouth daily. , Disp: , Rfl:    allopurinol (ZYLOPRIM) 100 MG tablet, Take 100 mg by mouth daily., Disp: , Rfl:    aspirin EC 81 MG tablet, Take 81 mg daily by mouth., Disp: , Rfl:    Aspirin-Salicylamide-Caffeine 423-536-14.4 MG PACK, Take 1 packet daily as needed by mouth (for pain.)., Disp: , Rfl:    diclofenac (CATAFLAM) 50 MG tablet, Take 1 tablet (50 mg total) by mouth 2 (two) times daily. (Patient not taking: Reported on 02/23/2021), Disp: 90 tablet, Rfl: 3   polyethylene glycol-electrolytes (TRILYTE) 420 g solution, Take 4,000 mLs by mouth as directed. (Patient not taking: Reported on  02/23/2021), Disp: 4000 mL, Rfl: 0   Tetrahyd-Glyc-Hypro-PEG-ZnSulf (VISINE TOTALITY MULTI-SYMPTOM OP), Apply 1 drop 3 (three) times daily as needed to eye (for dry/irritated eyes.)., Disp: , Rfl:         Past Surgical History:  Procedure Laterality Date   ANAL FISSURECTOMY       BACK SURGERY   Lumbar   CARPAL TUNNEL RELEASE Right     COLONOSCOPY N/A 02/21/2017    Procedure: COLONOSCOPY;  Surgeon: Danie Binder, MD;  Location: [facility];  Service: Endoscopy;  Laterality: N/A;  8:30 am - patient refused to move up at all   HIP SURGERY Right     [place] W/ [facility]          Physical Exam Constitutional:      General: She is not in acute distress.    Appearance: She is well-developed.     Comments: Well developed, well nourished Normal grooming and hygiene      Cardiovascular:     Comments: No peripheral edema Musculoskeletal:     Comments: Surprisingly minimal discomfort with hip flexion she does have pain and crepitance with abduction of the hip   The  patient cannot really lie in a position for me to check leg lengths and this will have to be checked prior to surgery and the x-ray is inconclusive in terms of leg length due to obliquity   Back was nontender   Was able to flex the hip past 90 degrees  Skin:    General: Skin is warm and dry.  Neurological:     Mental Status: She is alert and oriented to person, place, and time.     Sensory: No sensory deficit.     Coordination: Coordination normal.     Gait: Gait normal.     Deep Tendon Reflexes: Reflexes are normal and symmetric.  Psychiatric:        Mood and Affect: Mood normal.        Behavior: Behavior normal.        Thought Content: Thought content normal.        Judgment: Judgment normal.     Comments: Affect normal     New images were obtained.  The patient has a right total hip she has a lumbar fusion she has a severely arthritic left hip with deformity of the femoral head no acetabular  abnormalities are seen.  Patient has Medicare advantage [facility] we asked her to call them to inquire about postop rehabilitation services  Surgery scheduled for December 2  She has 2-3 steps to get in the house and the bedroom bathroom on the first floor  We ordered a walker  The procedure has been fully reviewed with the patient; The risks and benefits of surgery have been discussed and explained and understood. Alternative treatment has also been reviewed, questions were encouraged and answered. The postoperative plan is also been reviewed.   Plan is for a left total hip

## 2021-03-13 ENCOUNTER — Inpatient Hospital Stay (HOSPITAL_COMMUNITY)
Admission: RE | Admit: 2021-03-13 | Discharge: 2021-03-17 | DRG: 470 | Disposition: A | Discharge status: Skilled Nursing Facility | Payer: Medicare Other | Attending: Orthopedic Surgery | Admitting: Orthopedic Surgery

## 2021-03-13 ENCOUNTER — Inpatient Hospital Stay (HOSPITAL_COMMUNITY): Payer: Medicare Other | Admitting: Anesthesiology

## 2021-03-13 ENCOUNTER — Encounter (HOSPITAL_COMMUNITY): Payer: Self-pay | Admitting: Orthopedic Surgery

## 2021-03-13 ENCOUNTER — Encounter (HOSPITAL_COMMUNITY)
Admission: RE | Disposition: A | Discharge status: Skilled Nursing Facility | Payer: Self-pay | Source: Home / Self Care | Attending: Orthopedic Surgery

## 2021-03-13 ENCOUNTER — Inpatient Hospital Stay (HOSPITAL_COMMUNITY): Payer: Medicare Other

## 2021-03-13 DIAGNOSIS — Z20822 Contact with and (suspected) exposure to covid-19: Secondary | ICD-10-CM | POA: Diagnosis present

## 2021-03-13 DIAGNOSIS — K219 Gastro-esophageal reflux disease without esophagitis: Secondary | ICD-10-CM | POA: Diagnosis present

## 2021-03-13 DIAGNOSIS — E669 Obesity, unspecified: Secondary | ICD-10-CM | POA: Diagnosis not present

## 2021-03-13 DIAGNOSIS — Z87891 Personal history of nicotine dependence: Secondary | ICD-10-CM | POA: Diagnosis not present

## 2021-03-13 DIAGNOSIS — Z96649 Presence of unspecified artificial hip joint: Secondary | ICD-10-CM

## 2021-03-13 DIAGNOSIS — Z7982 Long term (current) use of aspirin: Secondary | ICD-10-CM

## 2021-03-13 DIAGNOSIS — M1612 Unilateral primary osteoarthritis, left hip: Secondary | ICD-10-CM | POA: Diagnosis not present

## 2021-03-13 DIAGNOSIS — I9581 Postprocedural hypotension: Secondary | ICD-10-CM | POA: Diagnosis not present

## 2021-03-13 DIAGNOSIS — E7800 Pure hypercholesterolemia, unspecified: Secondary | ICD-10-CM | POA: Diagnosis not present

## 2021-03-13 DIAGNOSIS — I10 Essential (primary) hypertension: Secondary | ICD-10-CM | POA: Diagnosis present

## 2021-03-13 DIAGNOSIS — Z01818 Encounter for other preprocedural examination: Secondary | ICD-10-CM | POA: Diagnosis not present

## 2021-03-13 DIAGNOSIS — M659 Synovitis and tenosynovitis, unspecified: Secondary | ICD-10-CM | POA: Diagnosis present

## 2021-03-13 DIAGNOSIS — Z981 Arthrodesis status: Secondary | ICD-10-CM | POA: Diagnosis not present

## 2021-03-13 DIAGNOSIS — D62 Acute posthemorrhagic anemia: Secondary | ICD-10-CM | POA: Diagnosis not present

## 2021-03-13 DIAGNOSIS — Z79899 Other long term (current) drug therapy: Secondary | ICD-10-CM

## 2021-03-13 DIAGNOSIS — Z96641 Presence of right artificial hip joint: Secondary | ICD-10-CM | POA: Diagnosis present

## 2021-03-13 DIAGNOSIS — M24152 Other articular cartilage disorders, left hip: Secondary | ICD-10-CM | POA: Diagnosis present

## 2021-03-13 DIAGNOSIS — Z96642 Presence of left artificial hip joint: Secondary | ICD-10-CM | POA: Diagnosis present

## 2021-03-13 DIAGNOSIS — Z888 Allergy status to other drugs, medicaments and biological substances status: Secondary | ICD-10-CM | POA: Diagnosis not present

## 2021-03-13 DIAGNOSIS — Z6841 Body Mass Index (BMI) 40.0 and over, adult: Secondary | ICD-10-CM

## 2021-03-13 HISTORY — PX: TOTAL HIP ARTHROPLASTY: SHX124

## 2021-03-13 LAB — COMPREHENSIVE METABOLIC PANEL
ALT: 10 U/L (ref 0–44)
AST: 22 U/L (ref 15–41)
Albumin: 2.5 g/dL — ABNORMAL LOW (ref 3.5–5.0)
Alkaline Phosphatase: 65 U/L (ref 38–126)
Anion gap: 6 (ref 5–15)
BUN: 17 mg/dL (ref 8–23)
CO2: 27 mmol/L (ref 22–32)
Calcium: 8.3 mg/dL — ABNORMAL LOW (ref 8.9–10.3)
Chloride: 104 mmol/L (ref 98–111)
Creatinine, Ser: 0.75 mg/dL (ref 0.44–1.00)
GFR, Estimated: >60 mL/min (ref >60)
Glucose, Bld: 125 mg/dL — ABNORMAL HIGH (ref 70–99)
Potassium: 3.4 mmol/L — ABNORMAL LOW (ref 3.5–5.1)
Sodium: 137 mmol/L (ref 135–145)
Total Bilirubin: 0.3 mg/dL (ref 0.3–1.2)
Total Protein: 5.1 g/dL — ABNORMAL LOW (ref 6.5–8.1)

## 2021-03-13 LAB — MAGNESIUM: Magnesium: 1.5 mg/dL — ABNORMAL LOW (ref 1.7–2.4)

## 2021-03-13 LAB — ABO/RH: ABO/RH(D): O POS

## 2021-03-13 LAB — CBC
HCT: 29.8 % — ABNORMAL LOW (ref 36.0–46.0)
Hemoglobin: 9.3 g/dL — ABNORMAL LOW (ref 12.0–15.0)
MCH: 27.5 pg (ref 26.0–34.0)
MCHC: 31.2 g/dL (ref 30.0–36.0)
MCV: 88.2 fL (ref 80.0–100.0)
Platelets: 276 10*3/uL (ref 150–400)
RBC: 3.38 MIL/uL — ABNORMAL LOW (ref 3.87–5.11)
RDW: 13.3 % (ref 11.5–15.5)
WBC: 10.1 10*3/uL (ref 4.0–10.5)
nRBC: 0 % (ref 0.0–0.2)

## 2021-03-13 LAB — MRSA NEXT GEN BY PCR, NASAL: MRSA by PCR Next Gen: NOT DETECTED

## 2021-03-13 LAB — PHOSPHORUS: Phosphorus: 2.5 mg/dL (ref 2.5–4.6)

## 2021-03-13 SURGERY — ARTHROPLASTY, HIP, TOTAL,POSTERIOR APPROACH
Anesthesia: Spinal | Site: Hip | Laterality: Left

## 2021-03-13 MED ORDER — HYDROCHLOROTHIAZIDE 25 MG PO TABS
25.0000 mg | ORAL_TABLET | Freq: Every day | ORAL | Status: DC
  Administered 2021-03-14: 25 mg via ORAL
  Filled 2021-03-13: qty 1

## 2021-03-13 MED ORDER — METHOCARBAMOL 500 MG PO TABS: 500.0000 mg | ORAL_TABLET | Freq: Four times a day (QID) | ORAL | Status: DC | PRN

## 2021-03-13 MED ORDER — DOCUSATE SODIUM 100 MG PO CAPS
100.0000 mg | ORAL_CAPSULE | Freq: Two times a day (BID) | ORAL | Status: DC
  Administered 2021-03-13 – 2021-03-17 (×7): 100 mg via ORAL
  Filled 2021-03-13 (×8): qty 1

## 2021-03-13 MED ORDER — ONDANSETRON HCL 4 MG PO TABS: 4.0000 mg | ORAL_TABLET | Freq: Four times a day (QID) | ORAL | Status: DC | PRN

## 2021-03-13 MED ORDER — SODIUM CHLORIDE 0.9 % IV SOLN: INTRAVENOUS | Status: DC

## 2021-03-13 MED ORDER — FENTANYL CITRATE PF 50 MCG/ML IJ SOSY
PREFILLED_SYRINGE | INTRAMUSCULAR | Status: AC
  Filled 2021-03-13: qty 1

## 2021-03-13 MED ORDER — PHENYLEPHRINE 40 MCG/ML (10ML) SYRINGE FOR IV PUSH
80.0000 ug | PREFILLED_SYRINGE | Freq: Once | INTRAVENOUS | Status: DC | PRN
  Filled 2021-03-13: qty 10

## 2021-03-13 MED ORDER — EPHEDRINE SULFATE-NACL 50-0.9 MG/10ML-% IV SOSY
PREFILLED_SYRINGE | INTRAVENOUS | Status: DC | PRN
  Administered 2021-03-13 (×2): 5 mg via INTRAVENOUS
  Administered 2021-03-13: 10 mg via INTRAVENOUS
  Administered 2021-03-13: 5 mg via INTRAVENOUS

## 2021-03-13 MED ORDER — PHENYLEPHRINE HCL-NACL 20-0.9 MG/250ML-% IV SOLN
INTRAVENOUS | Status: DC | PRN
  Administered 2021-03-13: 40 ug/min via INTRAVENOUS

## 2021-03-13 MED ORDER — CELECOXIB 400 MG PO CAPS
400.0000 mg | ORAL_CAPSULE | Freq: Once | ORAL | Status: DC
  Administered 2021-03-13: 400 mg via ORAL

## 2021-03-13 MED ORDER — METOPROLOL TARTRATE 50 MG PO TABS
50.0000 mg | ORAL_TABLET | Freq: Every day | ORAL | Status: DC
  Administered 2021-03-14 – 2021-03-15 (×2): 50 mg via ORAL
  Filled 2021-03-13 (×3): qty 1

## 2021-03-13 MED ORDER — OXYCODONE HCL 5 MG PO TABS
ORAL_TABLET | ORAL | Status: AC
  Filled 2021-03-13: qty 1

## 2021-03-13 MED ORDER — ONDANSETRON HCL 4 MG/2ML IJ SOLN: 4.0000 mg | Freq: Once | INTRAMUSCULAR | Status: DC | PRN

## 2021-03-13 MED ORDER — CELECOXIB 400 MG PO CAPS
ORAL_CAPSULE | ORAL | Status: AC
  Filled 2021-03-13: qty 1

## 2021-03-13 MED ORDER — PROPOFOL 500 MG/50ML IV EMUL
INTRAVENOUS | Status: DC | PRN
Start: 2021-03-13 — End: 2021-03-13
  Administered 2021-03-13: 40 ug/kg/min via INTRAVENOUS

## 2021-03-13 MED ORDER — FENTANYL CITRATE PF 50 MCG/ML IJ SOSY: 25.0000 ug | PREFILLED_SYRINGE | INTRAMUSCULAR | Status: DC | PRN

## 2021-03-13 MED ORDER — ORAL CARE MOUTH RINSE: 15.0000 mL | Freq: Once | OROMUCOSAL | Status: AC

## 2021-03-13 MED ORDER — PROPOFOL 10 MG/ML IV BOLUS
INTRAVENOUS | Status: AC
  Filled 2021-03-13: qty 20

## 2021-03-13 MED ORDER — TRIAMCINOLONE ACETONIDE 0.1 % EX CREA: 1.0000 "application " | TOPICAL_CREAM | Freq: Two times a day (BID) | CUTANEOUS | Status: DC | PRN

## 2021-03-13 MED ORDER — SODIUM CHLORIDE 0.9 % IV SOLN: 0.0000 ug/min | INTRAVENOUS | Status: DC

## 2021-03-13 MED ORDER — METOCLOPRAMIDE HCL 10 MG PO TABS: 5.0000 mg | ORAL_TABLET | Freq: Three times a day (TID) | ORAL | Status: DC | PRN

## 2021-03-13 MED ORDER — 0.9 % SODIUM CHLORIDE (POUR BTL) OPTIME
TOPICAL | Status: DC | PRN
  Administered 2021-03-13: 1000 mL

## 2021-03-13 MED ORDER — BUPIVACAINE IN DEXTROSE 0.75-8.25 % IT SOLN
INTRATHECAL | Status: DC | PRN
  Administered 2021-03-13: 1.8 mL via INTRATHECAL

## 2021-03-13 MED ORDER — TRANEXAMIC ACID-NACL 1000-0.7 MG/100ML-% IV SOLN
1000.0000 mg | INTRAVENOUS | Status: AC
  Administered 2021-03-13: 1000 mg via INTRAVENOUS
  Filled 2021-03-13: qty 100

## 2021-03-13 MED ORDER — DEXAMETHASONE SODIUM PHOSPHATE 10 MG/ML IJ SOLN
10.0000 mg | Freq: Once | INTRAMUSCULAR | Status: AC
  Administered 2021-03-14: 10 mg via INTRAVENOUS
  Filled 2021-03-13: qty 1

## 2021-03-13 MED ORDER — SODIUM CHLORIDE 0.9 % IV SOLN
250.0000 mL | INTRAVENOUS | Status: DC
  Administered 2021-03-13 (×2): 250 mL via INTRAVENOUS

## 2021-03-13 MED ORDER — MORPHINE SULFATE (PF) 2 MG/ML IV SOLN
0.5000 mg | INTRAVENOUS | Status: DC | PRN
  Administered 2021-03-13: 1 mg via INTRAVENOUS
  Filled 2021-03-13: qty 1

## 2021-03-13 MED ORDER — VANCOMYCIN HCL 1000 MG/200ML IV SOLN
1000.0000 mg | INTRAVENOUS | Status: AC
  Administered 2021-03-13: 1000 mg via INTRAVENOUS
  Filled 2021-03-13: qty 200

## 2021-03-13 MED ORDER — FENTANYL CITRATE PF 50 MCG/ML IJ SOSY: 50.0000 ug | PREFILLED_SYRINGE | INTRAMUSCULAR | Status: DC | PRN

## 2021-03-13 MED ORDER — BUPIVACAINE-EPINEPHRINE (PF) 0.5% -1:200000 IJ SOLN
INTRAMUSCULAR | Status: DC | PRN
  Administered 2021-03-13: 20 mL

## 2021-03-13 MED ORDER — POLYETHYLENE GLYCOL 3350 17 G PO PACK
17.0000 g | PACK | Freq: Every day | ORAL | Status: DC
  Administered 2021-03-14 – 2021-03-17 (×4): 17 g via ORAL
  Filled 2021-03-13 (×4): qty 1

## 2021-03-13 MED ORDER — ONDANSETRON HCL 4 MG/2ML IJ SOLN
4.0000 mg | Freq: Once | INTRAMUSCULAR | Status: DC
  Administered 2021-03-13: 4 mg via INTRAVENOUS

## 2021-03-13 MED ORDER — CHLORHEXIDINE GLUCONATE 0.12 % MT SOLN
15.0000 mL | Freq: Once | OROMUCOSAL | Status: AC
  Administered 2021-03-13: 15 mL via OROMUCOSAL

## 2021-03-13 MED ORDER — FENTANYL CITRATE PF 50 MCG/ML IJ SOSY
50.0000 ug | PREFILLED_SYRINGE | INTRAMUSCULAR | Status: DC
Start: 2021-03-13 — End: 2021-03-13
  Administered 2021-03-13: 25 ug via INTRAVENOUS

## 2021-03-13 MED ORDER — SODIUM CHLORIDE 0.9 % IV SOLN
25.0000 ug/min | INTRAVENOUS | Status: DC
  Administered 2021-03-13: 30 ug/min via INTRAVENOUS
  Administered 2021-03-13: 10 ug/min via INTRAVENOUS
  Filled 2021-03-13: qty 2

## 2021-03-13 MED ORDER — CHLORHEXIDINE GLUCONATE CLOTH 2 % EX PADS: 6.0000 | MEDICATED_PAD | Freq: Every day | CUTANEOUS | Status: DC

## 2021-03-13 MED ORDER — ALUM & MAG HYDROXIDE-SIMETH 200-200-20 MG/5ML PO SUSP: 30.0000 mL | ORAL | Status: DC | PRN

## 2021-03-13 MED ORDER — HYDROCODONE-ACETAMINOPHEN 7.5-325 MG PO TABS
1.0000 | ORAL_TABLET | ORAL | Status: DC | PRN
  Administered 2021-03-13: 1 via ORAL
  Administered 2021-03-14: 2 via ORAL
  Filled 2021-03-13: qty 2
  Filled 2021-03-13: qty 1

## 2021-03-13 MED ORDER — ASPIRIN EC 81 MG PO TBEC: 81.0000 mg | DELAYED_RELEASE_TABLET | Freq: Every day | ORAL | Status: DC

## 2021-03-13 MED ORDER — METOCLOPRAMIDE HCL 5 MG/ML IJ SOLN
5.0000 mg | Freq: Three times a day (TID) | INTRAMUSCULAR | Status: DC | PRN
  Administered 2021-03-13: 5 mg via INTRAVENOUS
  Filled 2021-03-13: qty 2

## 2021-03-13 MED ORDER — TRAMADOL HCL 50 MG PO TABS
50.0000 mg | ORAL_TABLET | Freq: Four times a day (QID) | ORAL | Status: DC
  Administered 2021-03-13 – 2021-03-17 (×16): 50 mg via ORAL
  Filled 2021-03-13 (×17): qty 1

## 2021-03-13 MED ORDER — METHOCARBAMOL 1000 MG/10ML IJ SOLN: 500.0000 mg | Freq: Four times a day (QID) | INTRAVENOUS | Status: DC | PRN

## 2021-03-13 MED ORDER — POVIDONE-IODINE 10 % EX SWAB: 2.0000 "application " | Freq: Once | CUTANEOUS | Status: DC

## 2021-03-13 MED ORDER — TRIAMCINOLONE ACETONIDE 0.1 % EX CREA
1.0000 "application " | TOPICAL_CREAM | Freq: Three times a day (TID) | CUTANEOUS | Status: DC
  Administered 2021-03-14 – 2021-03-16 (×5): 1 via TOPICAL
  Filled 2021-03-13: qty 15

## 2021-03-13 MED ORDER — PHENOL 1.4 % MT LIQD: 1.0000 | OROMUCOSAL | Status: DC | PRN

## 2021-03-13 MED ORDER — CEFAZOLIN SODIUM-DEXTROSE 2-4 GM/100ML-% IV SOLN
2.0000 g | Freq: Four times a day (QID) | INTRAVENOUS | Status: AC
  Administered 2021-03-13 (×2): 2 g via INTRAVENOUS
  Filled 2021-03-13 (×2): qty 100

## 2021-03-13 MED ORDER — FENTANYL CITRATE (PF) 100 MCG/2ML IJ SOLN
INTRAMUSCULAR | Status: AC
  Filled 2021-03-13: qty 2

## 2021-03-13 MED ORDER — BUPIVACAINE LIPOSOME 1.3 % IJ SUSP
INTRAMUSCULAR | Status: DC | PRN
  Administered 2021-03-13: 20 mL

## 2021-03-13 MED ORDER — ACETAMINOPHEN 500 MG PO TABS
500.0000 mg | ORAL_TABLET | Freq: Four times a day (QID) | ORAL | Status: AC
  Administered 2021-03-13 – 2021-03-14 (×3): 500 mg via ORAL
  Filled 2021-03-13 (×4): qty 1

## 2021-03-13 MED ORDER — ONDANSETRON HCL 4 MG/2ML IJ SOLN
INTRAMUSCULAR | Status: AC
  Filled 2021-03-13: qty 2

## 2021-03-13 MED ORDER — BUPIVACAINE-EPINEPHRINE (PF) 0.5% -1:200000 IJ SOLN
INTRAMUSCULAR | Status: AC
  Filled 2021-03-13: qty 30

## 2021-03-13 MED ORDER — ACETAMINOPHEN 325 MG PO TABS
325.0000 mg | ORAL_TABLET | Freq: Four times a day (QID) | ORAL | Status: DC | PRN
  Administered 2021-03-14: 500 mg via ORAL

## 2021-03-13 MED ORDER — PHENYLEPHRINE 40 MCG/ML (10ML) SYRINGE FOR IV PUSH (FOR BLOOD PRESSURE SUPPORT)
PREFILLED_SYRINGE | INTRAVENOUS | Status: DC | PRN
  Administered 2021-03-13 (×3): 80 ug via INTRAVENOUS

## 2021-03-13 MED ORDER — POLYVINYL ALCOHOL 1.4 % OP SOLN: 1.0000 [drp] | Freq: Three times a day (TID) | OPHTHALMIC | Status: DC | PRN

## 2021-03-13 MED ORDER — BUPIVACAINE LIPOSOME 1.3 % IJ SUSP
INTRAMUSCULAR | Status: AC
  Filled 2021-03-13: qty 20

## 2021-03-13 MED ORDER — FENTANYL CITRATE (PF) 100 MCG/2ML IJ SOLN
INTRAMUSCULAR | Status: DC | PRN
Start: 2021-03-13 — End: 2021-03-13
  Administered 2021-03-13 (×2): 50 ug via INTRAVENOUS

## 2021-03-13 MED ORDER — SODIUM CHLORIDE 0.9 % IR SOLN
Status: DC | PRN
  Administered 2021-03-13: 3000 mL

## 2021-03-13 MED ORDER — PANTOPRAZOLE SODIUM 40 MG PO TBEC
40.0000 mg | DELAYED_RELEASE_TABLET | Freq: Every day | ORAL | Status: DC
  Administered 2021-03-14 – 2021-03-17 (×4): 40 mg via ORAL
  Filled 2021-03-13 (×4): qty 1

## 2021-03-13 MED ORDER — PREGABALIN 50 MG PO CAPS
ORAL_CAPSULE | ORAL | Status: AC
  Filled 2021-03-13: qty 1

## 2021-03-13 MED ORDER — BISACODYL 10 MG RE SUPP: 10.0000 mg | Freq: Every day | RECTAL | Status: DC | PRN

## 2021-03-13 MED ORDER — GEMFIBROZIL 600 MG PO TABS
600.0000 mg | ORAL_TABLET | Freq: Every day | ORAL | Status: DC
  Administered 2021-03-14 – 2021-03-17 (×4): 600 mg via ORAL
  Filled 2021-03-13 (×4): qty 1

## 2021-03-13 MED ORDER — TRANEXAMIC ACID-NACL 1000-0.7 MG/100ML-% IV SOLN: 1000.0000 mg | Freq: Once | INTRAVENOUS | Status: DC

## 2021-03-13 MED ORDER — ONDANSETRON HCL 4 MG/2ML IJ SOLN: 4.0000 mg | Freq: Four times a day (QID) | INTRAMUSCULAR | Status: DC | PRN

## 2021-03-13 MED ORDER — TRANEXAMIC ACID-NACL 1000-0.7 MG/100ML-% IV SOLN
INTRAVENOUS | Status: AC
  Filled 2021-03-13: qty 100

## 2021-03-13 MED ORDER — CHLORHEXIDINE GLUCONATE CLOTH 2 % EX PADS
6.0000 | MEDICATED_PAD | Freq: Every day | CUTANEOUS | Status: DC
  Administered 2021-03-14: 18:00:00 6 via TOPICAL

## 2021-03-13 MED ORDER — ASPIRIN 81 MG PO CHEW
81.0000 mg | CHEWABLE_TABLET | Freq: Two times a day (BID) | ORAL | Status: DC
  Administered 2021-03-14 – 2021-03-17 (×7): 81 mg via ORAL
  Filled 2021-03-13 (×7): qty 1

## 2021-03-13 MED ORDER — DIPHENHYDRAMINE HCL 12.5 MG/5ML PO ELIX: 12.5000 mg | ORAL_SOLUTION | ORAL | Status: DC | PRN

## 2021-03-13 MED ORDER — HYDROCODONE-ACETAMINOPHEN 5-325 MG PO TABS
1.0000 | ORAL_TABLET | ORAL | Status: DC | PRN
  Administered 2021-03-16: 1 via ORAL
  Filled 2021-03-13: qty 1

## 2021-03-13 MED ORDER — PREGABALIN 50 MG PO CAPS
50.0000 mg | ORAL_CAPSULE | Freq: Once | ORAL | Status: DC
  Administered 2021-03-13: 50 mg via ORAL

## 2021-03-13 MED ORDER — PHENYLEPHRINE HCL-NACL 20-0.9 MG/250ML-% IV SOLN
INTRAVENOUS | Status: AC
  Filled 2021-03-13: qty 250

## 2021-03-13 MED ORDER — ALLOPURINOL 100 MG PO TABS
100.0000 mg | ORAL_TABLET | Freq: Every day | ORAL | Status: DC
  Administered 2021-03-14 – 2021-03-17 (×4): 100 mg via ORAL
  Filled 2021-03-13 (×5): qty 1

## 2021-03-13 MED ORDER — MENTHOL 3 MG MT LOZG: 1.0000 | LOZENGE | OROMUCOSAL | Status: DC | PRN

## 2021-03-13 MED ORDER — OXYCODONE HCL 5 MG PO TABS
5.0000 mg | ORAL_TABLET | Freq: Once | ORAL | Status: DC
  Administered 2021-03-13: 5 mg via ORAL

## 2021-03-13 MED ORDER — LACTATED RINGERS IV SOLN
INTRAVENOUS | Status: DC
  Administered 2021-03-13 (×4): 1000 mL via INTRAVENOUS

## 2021-03-13 MED ORDER — METHOCARBAMOL 1000 MG/10ML IJ SOLN
500.0000 mg | Freq: Once | INTRAVENOUS | Status: DC
  Administered 2021-03-13: 500 mg via INTRAVENOUS
  Filled 2021-03-13: qty 500

## 2021-03-13 SURGICAL SUPPLY — 64 items
ARTICULEZE HEAD (Hips) ×2 IMPLANT
BIT DRILL 2.8X128 (BIT) ×2 IMPLANT
BLADE HEX COATED 2.75 (ELECTRODE) ×2 IMPLANT
BLADE SAGITTAL 25.0X1.27X90 (BLADE) ×2 IMPLANT
BRUSH FEMORAL CANAL (MISCELLANEOUS) ×1 IMPLANT
CLOTH BEACON ORANGE TIMEOUT ST (SAFETY) ×2 IMPLANT
COVER LIGHT HANDLE STERIS (MISCELLANEOUS) ×4 IMPLANT
DRAPE BACK TABLE (DRAPES) ×2 IMPLANT
DRAPE HIP W/POCKET STRL (MISCELLANEOUS) ×2 IMPLANT
DRAPE U-SHAPE 47X51 STRL (DRAPES) ×2 IMPLANT
DRSG MEPILEX BORDER 4X12 (GAUZE/BANDAGES/DRESSINGS) ×3 IMPLANT
DRSG MEPILEX SACRM 8.7X9.8 (GAUZE/BANDAGES/DRESSINGS) ×2 IMPLANT
DURAPREP 26ML APPLICATOR (WOUND CARE) ×4 IMPLANT
ELECT REM PT RETURN 9FT ADLT (ELECTROSURGICAL) ×2
ELECTRODE REM PT RTRN 9FT ADLT (ELECTROSURGICAL) ×1 IMPLANT
ELIMINATOR HOLE APEX DEPUY (Hips) ×1 IMPLANT
GLOVE SS N UNI LF 8.5 STRL (GLOVE) ×2 IMPLANT
GLOVE SURG POLYISO LF SZ8 (GLOVE) ×4 IMPLANT
GLOVE SURG UNDER POLY LF SZ7 (GLOVE) ×8 IMPLANT
GOWN STRL REUS W/TWL LRG LVL3 (GOWN DISPOSABLE) ×6 IMPLANT
GOWN STRL REUS W/TWL XL LVL3 (GOWN DISPOSABLE) ×2 IMPLANT
HANDPIECE INTERPULSE COAX TIP (DISPOSABLE) ×2
HEAD ARTICULEZE (Hips) IMPLANT
HOOD W/PEELAWAY (MISCELLANEOUS) ×7 IMPLANT
INST SET MAJOR BONE (KITS) ×2 IMPLANT
IV NS IRRIG 3000ML ARTHROMATIC (IV SOLUTION) ×2 IMPLANT
KIT BLADEGUARD II DBL (SET/KITS/TRAYS/PACK) ×2 IMPLANT
KIT TURNOVER KIT A (KITS) ×2 IMPLANT
LINER NEUTRAL 52X36MM PLUS 4 (Liner) ×1 IMPLANT
MANIFOLD NEPTUNE II (INSTRUMENTS) ×2 IMPLANT
MARKER SKIN DUAL TIP RULER LAB (MISCELLANEOUS) ×2 IMPLANT
NDL HYPO 18GX1.5 BLUNT FILL (NEEDLE) IMPLANT
NDL HYPO 21X1.5 SAFETY (NEEDLE) ×1 IMPLANT
NDL MAYO 1/2 CRC TROCAR PT (NEEDLE) IMPLANT
NEEDLE HYPO 18GX1.5 BLUNT FILL (NEEDLE) ×2 IMPLANT
NEEDLE HYPO 21X1.5 SAFETY (NEEDLE) ×2 IMPLANT
NEEDLE MAYO 1/2 CRC TROCAR PT (NEEDLE) ×2 IMPLANT
NS IRRIG 1000ML POUR BTL (IV SOLUTION) ×2 IMPLANT
PACK TOTAL JOINT (CUSTOM PROCEDURE TRAY) ×2 IMPLANT
PAD ARMBOARD 7.5X6 YLW CONV (MISCELLANEOUS) ×2 IMPLANT
PIN SECTOR W/GRIP ACE CUP 52MM (Hips) ×1 IMPLANT
PIN STMN SNGL STERILE 9X3.6MM (PIN) ×6 IMPLANT
SCREW 6.5MMX25MM (Screw) ×1 IMPLANT
SCREW PINN CAN 6.5X20 (Screw) ×1 IMPLANT
SET BASIN LINEN APH (SET/KITS/TRAYS/PACK) ×2 IMPLANT
SET HNDPC FAN SPRY TIP SCT (DISPOSABLE) ×1 IMPLANT
SPONGE T-LAP 18X18 ~~LOC~~+RFID (SPONGE) ×10 IMPLANT
STAPLER VISISTAT 35W (STAPLE) ×2 IMPLANT
STEM TRI LOC BPS GRIPTON SZ1 IMPLANT
SUT BRALON NAB BRD #1 30IN (SUTURE) ×4 IMPLANT
SUT ETHIBOND 5 LR DA (SUTURE) ×3 IMPLANT
SUT MNCRL 0 VIOLET CTX 36 (SUTURE) ×1 IMPLANT
SUT MON AB 0 CT1 (SUTURE) ×1 IMPLANT
SUT MONOCRYL 0 CTX 36 (SUTURE) ×2
SUT VIC AB 1 CT1 27 (SUTURE) ×10
SUT VIC AB 1 CT1 27XBRD ANTBC (SUTURE) ×2 IMPLANT
SYR 20ML LL LF (SYRINGE) ×1 IMPLANT
SYR 30ML LL (SYRINGE) ×1 IMPLANT
SYR BULB IRRIG 60ML STRL (SYRINGE) ×2 IMPLANT
TOWEL OR 17X26 4PK STRL BLUE (TOWEL DISPOSABLE) ×2 IMPLANT
TRAY FOLEY MTR SLVR 16FR STAT (SET/KITS/TRAYS/PACK) ×2 IMPLANT
TRI LOC BPS W/GRIPTON SZ1 OFF ×2 IMPLANT
YANKAUER SUCT 12FT TUBE ARGYLE (SUCTIONS) ×2 IMPLANT
YANKAUER SUCT BULB TIP NO VENT (SUCTIONS) ×1 IMPLANT

## 2021-03-13 NOTE — Op Note (Signed)
03/13/2021  11:03 AM  PATIENT:  Jennifer Bennett  75 y.o. female  PRE-OPERATIVE DIAGNOSIS:  Osteoarthritis left hip  POST-OPERATIVE DIAGNOSIS:  Osteoarthritis left hip  PROCEDURE:  Procedure(s): TOTAL HIP ARTHROPLASTY (Left) with autogenous acetabular bone graft  SURGEON:  Surgeon(s) and Role:    Carole Civil, MD - Primary  Findings at surgery:  Calcified acetabular labrum obscuring the true acetabulum, extensive synovitis around and in the hip joint.  Extensive scarring around the hip joint.  Extensive wear of the femoral head and acetabulum.  Thin the base/central portion of the acetabulum was very thin   IMPLANTS: DEPUY  Size 1 trilock stem HO Size 52 gription cup with 2 screws and 36 poly liner 36 metal head   DIRECT LATERAL APPROACH   Details of the procedure  The patient was evaluated in the preop area and cleared for surgery.  Chart review was completed along with imaging and surgical site was confirmed as left hip and marked  Patient was taken to the operating room for spinal anesthetic.  Foley catheter was inserted.  She was then placed in the lateral decubitus position with the left side up and an axillary roll  Her lower extremities were padded and secured to the bed  We used a Stulberg hip positioner to hold her in the lateral position  Her leg was prepped and draped sterilely  Timeout was completed implants were checked   Direct lateral approach was performed  An extensive direct lateral approach was performed using a long incision based on the patient's body habitus and obesity  The subcutaneous tissue was divided extensive adipose tissue was encountered with extensive bleeding from subcutaneous veins  Once we get down to fascia was split in line with the skin incision  The abductors were identified in the anterior half in continuity with the vastus lateralis was dissected from the greater trochanter including the gluteus minimus.   Extensive bleeding was encountered in this area as well.  Extensive scarring around the hip joint was noted  Capsulectomy was performed and 4 Steinmann pins were placed to retract the abductors  The hip was dislocated anteriorly and a provisional cut was made using a cutting guide.  The proximal femur was prepared using a box osteotome a starter hole reamer canal finder and then broaching up to a size 1.  The acetabular rim and labrum was calcified and had to be removed piecemeal.  Once the inferior portion of the capsule was removed the transverse acetabular ligament also had to be removed to improve visualization  A retractor was placed at the inferior portion of the acetabulum anteriorly and posteriorly and sequential reaming was performed starting with a 44.  The 44 reamer went right to the base of the acetabulum and violated the medial wall.  Careful reaming was performed to ream up to a size 51 and then a size 52 grip shin cup was placed with good fit however due to the acetabular wall violation autogenous graft from the reamings was placed.  2 screws were then placed to secure the cup.  Our aim was for 40 degrees of abduction and 15 degrees of anteversion.  A size 36+1 high offset head neck was attached to the stem and a trial reduction was performed.  Although the stability was good I thought that a +5 would give good stability and trialed with a 36+5 and was happier with the tension although it gave a slightly increase in leg length.  This was  the more stable position including flexion sleep position external rotation and shuck test.  All trial implants were removed drill holes were placed in the trochanter #5 suture was passed and then the hip was reduced with a #1 stem the 36 high offset +5 head neck.  Repeat trial reduction was successful and showed a stable hip with good range of motion  Extensive irrigation was performed exparel was injected in the deep tissues.  Steinmann pins were  removed.  # 1 Braylon in interrupted fashion and #5 Ethibond was used to repair the abductor vastus lateralis sleeve  Irrigation was performed again followed by #1 Braylon in a running fashion to close the fascia and then 2 layers of 0 Monocryl and staples.  We injected the subcutaneous tissue with Marcaine with epinephrine  With the patient the supine position again she has a slightly longer leg length on the left.  This will be addressed with a shoe lift on the right if needed  It is less than a centimeter.  Postop plan she will need 6 weeks of protected weightbearing with a walker due to the bone grafting  DVT prevention aspirin otherwise routine direct lateral protocol precautions    PHYSICIAN ASSISTANT:   ASSISTANTS: Fulton Mole  ANESTHESIA:   Spinal  EBL:  300 mL   BLOOD ADMINISTERED:none  DRAINS: none   LOCAL MEDICATIONS USED:  MARCAINE 20 cc and OTHER exparel 20 cc full-strength  SPECIMEN:  No Specimen  DISPOSITION OF SPECIMEN:  N/A  COUNTS:  YES  TOURNIQUET:  * No tourniquets in log *  DICTATION: .Dragon Dictation  PLAN OF CARE: Admit to inpatient   PATIENT DISPOSITION:  PACU - hemodynamically stable.   Delay start of Pharmacological VTE agent (>24hrs) due to surgical blood loss or risk of bleeding: yes

## 2021-03-13 NOTE — Anesthesia Preprocedure Evaluation (Signed)
Anesthesia Evaluation  Patient identified by MRN, date of birth, ID band Patient awake    Reviewed: Allergy & Precautions, H&P , NPO status , Patient's Chart, lab work & pertinent test results, reviewed documented beta blocker date and time   Airway Mallampati: II  TM Distance: >3 FB Neck ROM: full    Dental no notable dental hx.    Pulmonary neg pulmonary ROS, former smoker,    Pulmonary exam normal breath sounds clear to auscultation       Cardiovascular Exercise Tolerance: Good hypertension, negative cardio ROS   Rhythm:regular Rate:Normal     Neuro/Psych negative neurological ROS  negative psych ROS   GI/Hepatic Neg liver ROS, GERD  Medicated,  Endo/Other  negative endocrine ROS  Renal/GU negative Renal ROS  negative genitourinary   Musculoskeletal   Abdominal   Peds  Hematology negative hematology ROS (+)   Anesthesia Other Findings   Reproductive/Obstetrics negative OB ROS                             Anesthesia Physical Anesthesia Plan  ASA: 2  Anesthesia Plan: Spinal   Post-op Pain Management:    Induction:   PONV Risk Score and Plan: Propofol infusion  Airway Management Planned:   Additional Equipment:   Intra-op Plan:   Post-operative Plan:   Informed Consent: I have reviewed the patients History and Physical, chart, labs and discussed the procedure including the risks, benefits and alternatives for the proposed anesthesia with the patient or authorized representative who has indicated his/her understanding and acceptance.     Dental Advisory Given  Plan Discussed with: CRNA  Anesthesia Plan Comments:         Anesthesia Quick Evaluation

## 2021-03-13 NOTE — Transfer of Care (Signed)
Immediate Anesthesia Transfer of Care Note  Patient: Jennifer Bennett  Procedure(s) Performed: TOTAL HIP ARTHROPLASTY (Left: Hip)  Patient Location: PACU  Anesthesia Type:MAC and Spinal  Level of Consciousness: awake, alert , oriented and patient cooperative  Airway & Oxygen Therapy: Patient Spontanous Breathing and Patient connected to face mask oxygen  Post-op Assessment: Report given to RN and Post -op Vital signs reviewed and stable  Post vital signs: Reviewed and stable  Last Vitals:  Vitals Value Taken Time  BP 156/78 03/13/21 1111  Temp    Pulse 55 03/13/21 1115  Resp 20 03/13/21 1115  SpO2 100 % 03/13/21 1115  Vitals shown include unvalidated device data.  Last Pain:  Vitals:   03/13/21 0702  TempSrc: Oral  PainSc: 2       Patients Stated Pain Goal: 6 (67/70/34 0352)  Complications: No notable events documented.

## 2021-03-13 NOTE — Brief Op Note (Signed)
03/13/2021  11:03 AM  PATIENT:  Jennifer Bennett  75 y.o. female  PRE-OPERATIVE DIAGNOSIS:  Osteoarthritis left hip  POST-OPERATIVE DIAGNOSIS:  Osteoarthritis left hip  PROCEDURE:  Procedure(s): TOTAL HIP ARTHROPLASTY (Left) with autogenous acetabular bone graft  SURGEON:  Surgeon(s) and Role:    Carole Civil, MD - Primary  Findings at surgery:  Calcified acetabular labrum obscuring the true acetabulum, extensive synovitis around and in the hip joint.  Extensive scarring around the hip joint.  Extensive wear of the femoral head and acetabulum.  Thin the base/central portion of the acetabulum was very thin   IMPLANTS: DEPUY  Size 1 trilock stem HO Size 52 gription cup with 2 screws and 36 poly liner 36 metal head   DIRECT LATERAL APPROACH   Details of the procedure  The patient was evaluated in the preop area and cleared for surgery.  Chart review was completed along with imaging and surgical site was confirmed as left hip and marked  Patient was taken to the operating room for spinal anesthetic.  Foley catheter was inserted.  She was then placed in the lateral decubitus position with the left side up and an axillary roll  Her lower extremities were padded and secured to the bed  We used a Stulberg hip positioner to hold her in the lateral position  Her leg was prepped and draped sterilely  Timeout was completed implants were checked   Direct lateral approach was performed  An extensive direct lateral approach was performed using a long incision based on the patient's body habitus and obesity  The subcutaneous tissue was divided extensive adipose tissue was encountered with extensive bleeding from subcutaneous veins  Once we get down to fascia was split in line with the skin incision  The abductors were identified in the anterior half in continuity with the vastus lateralis was dissected from the greater trochanter including the gluteus minimus.   Extensive bleeding was encountered in this area as well.  Extensive scarring around the hip joint was noted  Capsulectomy was performed and 4 Steinmann pins were placed to retract the abductors  The hip was dislocated anteriorly and a provisional cut was made using a cutting guide.  The proximal femur was prepared using a box osteotome a starter hole reamer canal finder and then broaching up to a size 1.  The acetabular rim and labrum was calcified and had to be removed piecemeal.  Once the inferior portion of the capsule was removed the transverse acetabular ligament also had to be removed to improve visualization  A retractor was placed at the inferior portion of the acetabulum anteriorly and posteriorly and sequential reaming was performed starting with a 44.  The 44 reamer went right to the base of the acetabulum and violated the medial wall.  Careful reaming was performed to ream up to a size 51 and then a size 52 grip shin cup was placed with good fit however due to the acetabular wall violation autogenous graft from the reamings was placed.  2 screws were then placed to secure the cup.  Our aim was for 40 degrees of abduction and 15 degrees of anteversion.  A size 36+1 high offset head neck was attached to the stem and a trial reduction was performed.  Although the stability was good I thought that a +5 would give good stability and trialed with a 36+5 and was happier with the tension although it gave a slightly increase in leg length.  This was  the more stable position including flexion sleep position external rotation and shuck test.  All trial implants were removed drill holes were placed in the trochanter #5 suture was passed and then the hip was reduced with a #1 stem the 36 high offset +5 head neck.  Repeat trial reduction was successful and showed a stable hip with good range of motion  Extensive irrigation was performed exparel was injected in the deep tissues.  Steinmann pins were  removed.  # 1 Braylon in interrupted fashion and #5 Ethibond was used to repair the abductor vastus lateralis sleeve  Irrigation was performed again followed by #1 Braylon in a running fashion to close the fascia and then 2 layers of 0 Monocryl and staples.  We injected the subcutaneous tissue with Marcaine with epinephrine  With the patient the supine position again she has a slightly longer leg length on the left.  This will be addressed with a shoe lift on the right if needed  It is less than a centimeter.  Postop plan she will need 6 weeks of protected weightbearing with a walker due to the bone grafting  DVT prevention aspirin otherwise routine direct lateral protocol precautions    PHYSICIAN ASSISTANT:   ASSISTANTS: Fulton Mole  ANESTHESIA:   Spinal  EBL:  300 mL   BLOOD ADMINISTERED:none  DRAINS: none   LOCAL MEDICATIONS USED:  MARCAINE 20 cc and OTHER exparel 20 cc full-strength  SPECIMEN:  No Specimen  DISPOSITION OF SPECIMEN:  N/A  COUNTS:  YES  TOURNIQUET:  * No tourniquets in log *  DICTATION: .Dragon Dictation  PLAN OF CARE: Admit to inpatient   PATIENT DISPOSITION:  PACU - hemodynamically stable.   Delay start of Pharmacological VTE agent (>24hrs) due to surgical blood loss or risk of bleeding: yes

## 2021-03-13 NOTE — Anesthesia Postprocedure Evaluation (Signed)
Anesthesia Post Note  Patient: Jennifer Bennett  Procedure(s) Performed: TOTAL HIP ARTHROPLASTY (Left: Hip)  Patient location during evaluation: PACU Anesthesia Type: Spinal Level of consciousness: awake and alert Pain management: pain level controlled Vital Signs Assessment: post-procedure vital signs reviewed and stable Respiratory status: spontaneous breathing, nonlabored ventilation, respiratory function stable and patient connected to nasal cannula oxygen Cardiovascular status: blood pressure returned to baseline and stable Postop Assessment: no apparent nausea or vomiting Anesthetic complications: no   No notable events documented.   Last Vitals:  Vitals:   03/13/21 1330 03/13/21 1345  BP: (!) 98/38 (!) 91/38  Pulse: 66 69  Resp: 19 20  Temp:    SpO2: 100% 100%    Last Pain:  Vitals:   03/13/21 1345  TempSrc:   PainSc: [name]

## 2021-03-13 NOTE — Interval H&P Note (Signed)
History and Physical Interval Note:  03/13/2021 7:24 AM  Jennifer Bennett  has presented today for surgery, with the diagnosis of Osteoarthritis left hip.  The various methods of treatment have been discussed with the patient and family. After consideration of risks, benefits and other options for treatment, the patient has consented to  Procedure(s): TOTAL HIP ARTHROPLASTY (Left) as a surgical intervention.  The patient's history has been reviewed, patient examined, no change in status, stable for surgery.  I have reviewed the patient's chart and labs.  Questions were answered to the patient's satisfaction.     Arther Abbott

## 2021-03-13 NOTE — Anesthesia Procedure Notes (Signed)
Spinal  Patient location during procedure: OR Start time: 03/13/2021 7:35 AM End time: 03/13/2021 7:45 AM Reason for block: surgical anesthesia Staffing Performed: anesthesiologist  Anesthesiologist: Louann Sjogren, MD Resident/CRNA: Myna Bright, CRNA Preanesthetic Checklist Completed: patient identified, IV checked, site marked, risks and benefits discussed, surgical consent, monitors and equipment checked, pre-op evaluation and timeout performed Spinal Block Patient position: sitting Prep: Betadine Patient monitoring: heart rate, cardiac monitor, continuous pulse ox and blood pressure Approach: midline Location: L3-4 Injection technique: single-shot Needle Needle type: Sprotte  Needle gauge: 22 G Needle length: 15 cm Needle insertion depth: 12 cm Assessment Sensory level: T6 Events: second provider

## 2021-03-14 LAB — BASIC METABOLIC PANEL
Anion gap: 4 — ABNORMAL LOW (ref 5–15)
BUN: 17 mg/dL (ref 8–23)
CO2: 27 mmol/L (ref 22–32)
Calcium: 8.3 mg/dL — ABNORMAL LOW (ref 8.9–10.3)
Chloride: 107 mmol/L (ref 98–111)
Creatinine, Ser: 0.8 mg/dL (ref 0.44–1.00)
GFR, Estimated: >60 mL/min (ref >60)
Glucose, Bld: 102 mg/dL — ABNORMAL HIGH (ref 70–99)
Potassium: 3.3 mmol/L — ABNORMAL LOW (ref 3.5–5.1)
Sodium: 138 mmol/L (ref 135–145)

## 2021-03-14 LAB — CBC
HCT: 29.7 % — ABNORMAL LOW (ref 36.0–46.0)
Hemoglobin: 9.2 g/dL — ABNORMAL LOW (ref 12.0–15.0)
MCH: 27.5 pg (ref 26.0–34.0)
MCHC: 31 g/dL (ref 30.0–36.0)
MCV: 88.9 fL (ref 80.0–100.0)
Platelets: 277 10*3/uL (ref 150–400)
RBC: 3.34 MIL/uL — ABNORMAL LOW (ref 3.87–5.11)
RDW: 13.4 % (ref 11.5–15.5)
WBC: 10.4 10*3/uL (ref 4.0–10.5)
nRBC: 0 % (ref 0.0–0.2)

## 2021-03-14 NOTE — Progress Notes (Signed)
Patient ID: Jennifer Bennett, female   DOB: Sep 06, 1945, 75 y.o.   MRN: [id number]  POD 1 S/P LEFT THA W ACETAB BONE GRAFT  POST LABILE BP REQ ADMIT TO ICU FOR NEO DRIP TO MAINTAIN BP  PAIN WELL CONTROLLED AT PRESENT  DRESSING DRY , THIGH NO SWELLING   DF AND PF NORMAL   CALF SOFT  BP (!) 115/48   Pulse 82   Temp 99 F (37.2 C) (Oral)   Resp (!) 23   Ht 5' 2.5" (1.588 m)   SpO2 96%   BMI 38.73 kg/m     CBC Latest Ref Rng & Units 03/14/2021 03/13/2021 03/10/2021  WBC 4.0 - 10.5 K/uL 10.4 10.1 6.3  Hemoglobin 12.0 - 15.0 g/dL 9.2(L) 9.3(L) 14.0  Hematocrit 36.0 - 46.0 % 29.7(L) 29.8(L) 45.8  Platelets 150 - 400 K/uL 277 276 382   BMP Latest Ref Rng & Units 03/14/2021 03/13/2021 03/10/2021  Glucose 70 - 99 mg/dL 102(H) 125(H) 101(H)  BUN 8 - 23 mg/dL 17 17 17   Creatinine 0.44 - 1.00 mg/dL 0.80 0.75 0.84  Sodium 135 - 145 mmol/L 138 137 140  Potassium 3.5 - 5.1 mmol/L 3.3(L) 3.4(L) 4.2  Chloride 98 - 111 mmol/L 107 104 102  CO2 22 - 32 mmol/L 27 27 28   Calcium 8.9 - 10.3 mg/dL 8.3(L) 8.3(L) 10.4(H)    I THINK WE CAN START TO TRY TO TAPER THE NEO AND SEE IF THE BP HOLDS   HOLD BP MEDS

## 2021-03-14 NOTE — Plan of Care (Signed)
  Problem: Acute Rehab PT Goals(only PT should resolve) Goal: Pt Will Go Supine/Side To Sit Outcome: Progressing Flowsheets (Taken 03/14/2021 1405) Pt will go Supine/Side to Sit: with min guard assist Goal: Patient Will Transfer Sit To/From Stand Outcome: Progressing Flowsheets (Taken 03/14/2021 1405) Patient will transfer sit to/from stand: with min guard assist Goal: Pt Will Transfer Bed To Chair/Chair To Bed Outcome: Progressing [place] (Taken 03/14/2021 1405) Pt will Transfer Bed to Chair/Chair to Bed: with min assist Goal: Pt Will Ambulate Outcome: Progressing Flowsheets (Taken 03/14/2021 1405) Pt will Ambulate:  25 feet  with supervision  with least restrictive assistive device  2:05 PM,03/14/21 Domenic Moras, PT, DPT Physical Therapist at [facility]

## 2021-03-14 NOTE — Evaluation (Signed)
Physical Therapy Evaluation Patient Details Name: Jennifer Bennett MRN: [id number] DOB: Dec 12, 1945 Today's Date: 03/14/2021  History of Present Illness  75 year old female with hypertension worsening left hip pain status post lumbar fusion.  The patient is concerned that now she is having more pain and now she is unable to walk and has to ambulate in a wheelchair.  She was on Tylenol 3 her primary care doctor switched her over to hydrocodone 10 with little relief of pain    She has 2 primary areas of concern in terms of pain 1 is in the groin and thigh and one is in the buttock and lateral leg. L THA 12/2.  Clinical Impression  Present for PT eval with sister in room for initial part of eval. Medicated before PT eval. Begin session with ankle pumps, quad sets and glute sets, no reports of increased pain, decreased activation on L vs R. Increased difficulty with bed mobility throughout with decreased core activation and mobility, and required assistance in moving LLE to transition to EOB. ModA-MaxA+2 for sit to stand transfer with increased difficulty with LLE activation and WB on LLE.   Able to transfer to stand with assist and take a few steps in room with decreased WB on LLE with R stepping, and able to transfer from stand to sit on chair with good reaching back with B UE. RN assist and in room, notified of mobility status. Patient will benefit from continued skilled physical therapy in hospital and recommended venue below to increase strength, balance, endurance for safe ADLs and gait.      Recommendations for follow up therapy are one component of a multi-disciplinary discharge planning process, led by the attending physician.  Recommendations may be updated based on patient status, additional functional criteria and insurance authorization.  Follow Up Recommendations Skilled nursing-short term rehab (<3 hours/day)    Assistance Recommended at Discharge    Functional Status Assessment  Patient has had a recent decline in their functional status and demonstrates the ability to make significant improvements in function in a reasonable and predictable amount of time.  Equipment Recommendations  None recommended by PT    Recommendations for Other Services       Precautions / Restrictions Precautions Precautions: Posterior Hip;Fall Precaution Comments: THA posterior precautions Restrictions Weight Bearing Restrictions: Yes LLE Weight Bearing: Weight bearing as tolerated      Mobility  Bed Mobility Overal bed mobility: Needs Assistance Bed Mobility: Supine to Sit     Supine to sit: Mod assist;Max assist     General bed mobility comments: HOB elevated, increased difficulty with LLE movement and with trunk. Patient Response: Cooperative;Anxious  Transfers Overall transfer level: Needs assistance Equipment used: Rolling walker (2 wheels) Transfers: Sit to/from Stand Sit to Stand: Max assist;+2 physical assistance           General transfer comment: RN assist with sit to stand transfer, increased difficulty with LLE activation to transition. good stand to sit transfer.    Ambulation/Gait Ambulation/Gait assistance: Min assist;Mod assist Gait Distance (Feet): 5 Feet Assistive device: Rolling walker (2 wheels) Gait Pattern/deviations: Step-to pattern;Decreased step length - right;Decreased step length - left;Decreased stance time - left;Decreased stride length;Shuffle Gait velocity: decreased     General Gait Details: shuffle, decreased stance on LLE, slow and labored  Stairs            Wheelchair Mobility    Modified Rankin (Stroke Patients Only)       Balance Overall balance assessment: Needs  assistance Sitting-balance support: Feet supported;Single extremity supported Sitting balance-Leahy Scale: Fair Sitting balance - Comments: fair, preference for UE support.   Standing balance support: Bilateral upper extremity supported;During  functional activity;Reliant on assistive device for balance Standing balance-Leahy Scale: Poor Standing balance comment: decreased weight shifting and reliance on assistive device for balance.                             Pertinent Vitals/Pain Pain Assessment: 0-10 Pain Score: 3  Pain Location: hip L Pain Descriptors / Indicators: Aching;Constant Pain Intervention(s): Limited activity within patient's tolerance;Premedicated before session    Home Living Family/patient expects to be discharged to:: Private residence Living Arrangements: Alone Available Help at Discharge: Other (Comment) (patient reports no help at discharge) Type of Home: House         Home Layout: One level Home Equipment: Conservation officer, nature (2 wheels);Rollator (4 wheels);Cane - single point;Toilet riser      Prior Function Prior Level of Function : Needs assist             Mobility Comments: decreased ambulation and independence in last few months requiring assistance in IADLs.       Hand Dominance        Extremity/Trunk Assessment   Upper Extremity Assessment Upper Extremity Assessment: Overall WFL for tasks assessed    Lower Extremity Assessment Lower Extremity Assessment: LLE deficits/detail LLE Deficits / Details: decreased muscle activation consistent with post-op. LLE: Unable to fully assess due to pain LLE Sensation: WNL LLE Coordination: decreased gross motor    Cervical / Trunk Assessment Cervical / Trunk Assessment: Normal  Communication   Communication: No difficulties  Cognition Arousal/Alertness: Awake/alert Behavior During Therapy: WFL for tasks assessed/performed Overall Cognitive Status: Within Functional Limits for tasks assessed                                          General Comments      Exercises General Exercises - Lower Extremity Ankle Circles/Pumps: AROM;Strengthening;Both;10 reps;Supine Quad Sets: AROM;Strengthening;Both;10  reps;Supine Gluteal Sets: AROM;Strengthening;Both;10 reps;Supine   Assessment/Plan    PT Assessment Patient needs continued PT services  PT Problem List Decreased strength;Decreased range of motion;Decreased activity tolerance;Decreased safety awareness;Decreased balance;Decreased mobility;Decreased coordination       PT Treatment Interventions DME instruction;Balance training;Gait training;Neuromuscular re-education;Functional mobility training;Patient/family education;Therapeutic activities;Therapeutic exercise    PT Goals (Current goals can be found in the Care Plan section)  Acute Rehab PT Goals Patient Stated Goal: improve strength, be safe to return home PT Goal Formulation: With patient Time For Goal Achievement: 03/28/21 Potential to Achieve Goals: Good    Frequency Min 3X/week   Barriers to discharge        Co-evaluation               AM-PAC PT "6 Clicks" Mobility  Outcome Measure Help needed turning from your back to your side while in a flat bed without using bedrails?: A Lot Help needed moving from lying on your back to sitting on the side of a flat bed without using bedrails?: A Lot Help needed moving to and from a bed to a chair (including a wheelchair)?: A Lot Help needed standing up from a chair using your arms (e.g., wheelchair or bedside chair)?: A Lot Help needed to walk in hospital room?: A Little Help needed climbing 3-5 steps  with a railing? : A Lot 6 Click Score: 13    End of Session Equipment Utilized During Treatment: Gait belt Activity Tolerance: Patient tolerated treatment well Patient left: in chair;with call bell/phone within reach;with nursing/sitter in room Nurse Communication: Mobility status PT Visit Diagnosis: Unsteadiness on feet (R26.81);Other abnormalities of gait and mobility (R26.89);Muscle weakness (generalized) (M62.81)    Time: 1200-1240 PT Time Calculation (min) (ACUTE ONLY): 40 min   Charges:   PT Evaluation $PT Eval  Low Complexity: 1 Low PT Treatments $Therapeutic Activity: 23-37 mins        2:04 PM,03/14/21 Domenic Moras, PT, DPT Physical Therapist at [facility]

## 2021-03-15 LAB — CBC
HCT: 26.8 % — ABNORMAL LOW (ref 36.0–46.0)
Hemoglobin: 8.4 g/dL — ABNORMAL LOW (ref 12.0–15.0)
MCH: 27.2 pg (ref 26.0–34.0)
MCHC: 31.3 g/dL (ref 30.0–36.0)
MCV: 86.7 fL (ref 80.0–100.0)
Platelets: 244 10*3/uL (ref 150–400)
RBC: 3.09 MIL/uL — ABNORMAL LOW (ref 3.87–5.11)
RDW: 13.3 % (ref 11.5–15.5)
WBC: 13.6 10*3/uL — ABNORMAL HIGH (ref 4.0–10.5)
nRBC: 0 % (ref 0.0–0.2)

## 2021-03-15 LAB — BASIC METABOLIC PANEL
Anion gap: 6 (ref 5–15)
BUN: 21 mg/dL (ref 8–23)
CO2: 28 mmol/L (ref 22–32)
Calcium: 8.4 mg/dL — ABNORMAL LOW (ref 8.9–10.3)
Chloride: 103 mmol/L (ref 98–111)
Creatinine, Ser: 0.69 mg/dL (ref 0.44–1.00)
GFR, Estimated: >60 mL/min (ref >60)
Glucose, Bld: 99 mg/dL (ref 70–99)
Potassium: 3.2 mmol/L — ABNORMAL LOW (ref 3.5–5.1)
Sodium: 137 mmol/L (ref 135–145)

## 2021-03-15 MED ORDER — POTASSIUM CHLORIDE CRYS ER 20 MEQ PO TBCR
20.0000 meq | EXTENDED_RELEASE_TABLET | Freq: Two times a day (BID) | ORAL | Status: DC
  Administered 2021-03-15 – 2021-03-17 (×4): 20 meq via ORAL
  Filled 2021-03-15 (×4): qty 1

## 2021-03-15 MED ORDER — SODIUM CHLORIDE 0.9 % IV SOLN: INTRAVENOUS | Status: DC

## 2021-03-15 NOTE — Progress Notes (Signed)
Physical Therapy Treatment Patient Details Name: Jennifer Bennett MRN: [id number] DOB: 10-24-1945 Today's Date: 03/15/2021   History of Present Illness 75 year old female with hypertension worsening left hip pain status post lumbar fusion.  The patient is concerned that now she is having more pain and now she is unable to walk and has to ambulate in a wheelchair.  She was on Tylenol 3 her primary care doctor switched her over to hydrocodone 10 with little relief of pain    She has 2 primary areas of concern in terms of pain 1 is in the groin and thigh and one is in the buttock and lateral leg. L THA 12/2.    PT Comments    Present in room and agreeable to PT treatment. Demo good improvement in muscular activation on LLE vs yesterday allowing for improved activity and progressive mobility. Reports mostly sore and aching in L hip. Demo good improvement in bed mobility with improved LE movement independently, but cont require assist in trunk to transition to sitting.  Able to complete heel raises/toe raises on EOB and some heel slides with 1-2 UE assist with improved balance and strength. Improved sit to stand and stand pivot transfers vs preivous session requiring ModA with good improvement in quad and glute activation to assist. Transfer to [facility] to urinate, increased difficulty getting fully on BSC, focus on backward scoots. Able to transfer off BSC and stand pivot transfer to chair with MinA-ModA. RN notified of mobility status and progress. Patient will benefit from continued skilled physical therapy in hospital and recommended venue below to increase strength, balance, endurance for safe ADLs and gait.   Recommendations for follow up therapy are one component of a multi-disciplinary discharge planning process, led by the attending physician.  Recommendations may be updated based on patient status, additional functional criteria and insurance authorization.  Follow Up Recommendations  Skilled  nursing-short term rehab (<3 hours/day)     Assistance Recommended at Discharge    Equipment Recommendations  None recommended by PT    Recommendations for Other Services       Precautions / Restrictions Precautions Precautions: Posterior Hip;Fall Precaution Comments: THA posterior precautions Restrictions Weight Bearing Restrictions: No LLE Weight Bearing: Weight bearing as tolerated     Mobility  Bed Mobility Overal bed mobility: Needs Assistance Bed Mobility: Supine to Sit     Supine to sit: Mod assist;Min assist     General bed mobility comments: HOB elevated, improved LE movement independently, cont require assistance in trunk righting. Patient Response: Cooperative  Transfers Overall transfer level: Needs assistance Equipment used: Rolling walker (2 wheels) Transfers: Sit to/from Stand;Bed to chair/wheelchair/BSC Sit to Stand: Mod assist Stand pivot transfers: Mod assist;Min assist         General transfer comment: good improvement with use of RW and ModA from PT, good improvement in quad and glute activation.    Ambulation/Gait Ambulation/Gait assistance: Min assist Gait Distance (Feet): 5 Feet Assistive device: Rolling walker (2 wheels) Gait Pattern/deviations: Step-to pattern;Decreased step length - right;Decreased step length - left;Decreased stance time - left;Decreased stride length;Shuffle Gait velocity: decreased     General Gait Details: shuffle, decreased stance on LLE, slow and labored   Stairs             Wheelchair Mobility    Modified Rankin (Stroke Patients Only)       Balance Overall balance assessment: Needs assistance Sitting-balance support: Feet supported;Single extremity supported Sitting balance-Leahy Scale: Fair Sitting balance - Comments: fair, preference  for UE support; improvement in EOB exercises withotu PT assist for balance. Postural control: Posterior lean Standing balance support: Bilateral upper  extremity supported;During functional activity;Reliant on assistive device for balance Standing balance-Leahy Scale: Fair Standing balance comment: improved balance with decreased WB through UEs                            Cognition Arousal/Alertness: Awake/alert Behavior During Therapy: WFL for tasks assessed/performed Overall Cognitive Status: Within Functional Limits for tasks assessed                                          Exercises General Exercises - Lower Extremity Ankle Circles/Pumps: AROM;Strengthening;Both;10 reps;Supine;Seated Quad Sets: AROM;Strengthening;Both;10 reps;Supine Gluteal Sets: AROM;Strengthening;Both;10 reps;Supine Heel Slides: AROM;Left;Strengthening;10 reps;Seated    General Comments        Pertinent Vitals/Pain Pain Assessment: 0-10 Pain Score: 2  Pain Location: hip L Pain Descriptors / Indicators: Aching;Throbbing Pain Intervention(s): Limited activity within patient's tolerance;Monitored during session;Premedicated before session    Home Living                          Prior Function            PT Goals (current goals can now be found in the care plan section) Acute Rehab PT Goals Patient Stated Goal: improve strength, be safe to return home PT Goal Formulation: With patient Time For Goal Achievement: 03/28/21 Potential to Achieve Goals: Good Progress towards PT goals: Progressing toward goals    Frequency    Min 5X/week      PT Plan Current plan remains appropriate    Co-evaluation              AM-PAC PT "6 Clicks" Mobility   Outcome Measure  Help needed turning from your back to your side while in a flat bed without using bedrails?: A Lot Help needed moving from lying on your back to sitting on the side of a flat bed without using bedrails?: A Lot Help needed moving to and from a bed to a chair (including a wheelchair)?: A Little Help needed standing up from a chair using your  arms (e.g., wheelchair or bedside chair)?: A Little Help needed to walk in hospital room?: A Little Help needed climbing 3-5 steps with a railing? : A Lot 6 Click Score: 15    End of Session Equipment Utilized During Treatment: Gait belt Activity Tolerance: Patient tolerated treatment well Patient left: in chair;with call bell/phone within reach;with nursing/sitter in room Nurse Communication: Mobility status PT Visit Diagnosis: Unsteadiness on feet (R26.81);Other abnormalities of gait and mobility (R26.89);Muscle weakness (generalized) (M62.81)     Time: 5465-6812 PT Time Calculation (min) (ACUTE ONLY): 38 min  Charges:  $Therapeutic Exercise: 23-37 mins $Therapeutic Activity: 8-22 mins                    12:02 PM,03/15/21 Domenic Moras, PT, DPT Physical Therapist at [facility]

## 2021-03-15 NOTE — NC FL2 (Signed)
[place] LEVEL OF CARE SCREENING TOOL     IDENTIFICATION  Patient Name: Jennifer Bennett Birthdate: 04-09-1946 Sex: female Admission Date (Current Location): 03/13/2021  [facility] and [place] Number:  [employer] and Address:  [facility] [address] [place]      Provider Number: [id number]  Attending Physician Name and Address:  Carole Civil, MD  Relative Name and Phone Number:  Hassel Neth (Sister)   [phone]    Current Level of Care: Hospital Recommended Level of Care: [place] Prior Approval Number:    Date Approved/Denied:   PASRR Number: [place] Must not available/ Will submit once available  Discharge Plan: Other (Comment) (SNF)    Current Diagnoses: Patient Active Problem List   Diagnosis Date Noted   Osteoarthritis of left hip 03/13/2021   S/P total left hip arthroplasty 03/13/2021   Unilateral primary osteoarthritis, left hip    Special screening for malignant neoplasms, colon    Primary osteoarthritis of right knee 01/22/2014   Gout of big toe 07/24/2013   Osteoarthritis of right knee 07/24/2013   OA (osteoarthritis) of knee 06/20/2012   Effusion of knee joint 05/16/2012   Arthritis of knee, degenerative 05/16/2012   Knee pain 05/16/2012   KNEE PAIN 06/17/2009   COUGH 02/12/2009   HIP, ARTHRITIS, DEGEN./OSTEO 12/06/2007   HYPERLIPIDEMIA 04/18/2007   OBESITY, TRUNCAL 04/18/2007   TOBACCO USER 04/18/2007   HYPERTENSION 04/18/2007   ALLERGIC RHINITIS 04/18/2007   PULMONARY NODULE 04/18/2007   DYSPNEA 04/18/2007    Orientation RESPIRATION BLADDER Height & Weight     Self, Time, Situation, Place  Normal Continent Weight: 227 lb 4.7 oz (103.1 kg) Height:  5' 2.5" (158.8 cm)  BEHAVIORAL SYMPTOMS/MOOD NEUROLOGICAL BOWEL NUTRITION STATUS      Continent Diet (Diet Carb Modified Fluid consistency: Thin; Room service appropriate? Yes)  AMBULATORY STATUS COMMUNICATION OF NEEDS  Skin   Extensive Assist Verbally Surgical wounds (Hip)                       Personal Care Assistance Level of Assistance  Bathing, Feeding, Dressing Bathing Assistance: Maximum assistance Feeding assistance: Independent Dressing Assistance: Maximum assistance     Functional Limitations Info  Sight, Hearing, Speech Sight Info: Adequate Hearing Info: Adequate Speech Info: Adequate    SPECIAL CARE FACTORS FREQUENCY  PT (By licensed PT)     PT Frequency: 5x              Contractures Contractures Info: Not present    Additional Factors Info  Code Status, Allergies Code Status Info: Full Allergies Info: Osteoarthritis of left hip           Current Medications (03/15/2021):  This is the current hospital active medication list Current Facility-Administered Medications  Medication Dose Route Frequency Provider Last Rate Last Admin   0.9 %  sodium chloride infusion   Intravenous Continuous Carole Civil, MD   Stopped at 03/14/21 2356   0.9 %  sodium chloride infusion  250 mL Intravenous Continuous Carole Civil, MD 15 mL/hr at 03/13/21 1531 250 mL at 03/13/21 1531   acetaminophen (TYLENOL) tablet 325-650 mg  325-650 mg Oral Q6H PRN Carole Civil, MD   500 mg at 03/14/21 1824   allopurinol (ZYLOPRIM) tablet 100 mg  100 mg Oral Daily Carole Civil, MD   100 mg at 03/15/21 1048   alum & mag hydroxide-simeth (MAALOX/MYLANTA) 200-200-20 MG/5ML suspension 30  mL  30 mL Oral Q4H PRN Carole Civil, MD       aspirin chewable tablet 81 mg  81 mg Oral BID Carole Civil, MD   81 mg at 03/15/21 1043   bisacodyl (DULCOLAX) suppository 10 mg  10 mg Rectal Daily PRN Carole Civil, MD       Chlorhexidine Gluconate Cloth 2 % PADS 6 each  6 each Topical Q0600 Adefeso, Oladapo, DO   6 each at 03/14/21 1730   diphenhydrAMINE (BENADRYL) 12.5 MG/5ML elixir 12.5-25 mg  12.5-25 mg Oral Q4H PRN Carole Civil, MD       docusate sodium (COLACE) capsule  100 mg  100 mg Oral BID Carole Civil, MD   100 mg at 03/15/21 1044   gemfibrozil (LOPID) tablet 600 mg  600 mg Oral Daily Carole Civil, MD   600 mg at 03/15/21 1043   HYDROcodone-acetaminophen (NORCO) 7.5-325 MG per tablet 1-2 tablet  1-2 tablet Oral Q4H PRN Carole Civil, MD   2 tablet at 03/14/21 0042   HYDROcodone-acetaminophen (NORCO/VICODIN) 5-325 MG per tablet 1-2 tablet  1-2 tablet Oral Q4H PRN Carole Civil, MD       menthol-cetylpyridinium (CEPACOL) lozenge 3 mg  1 lozenge Oral PRN Carole Civil, MD       Or   phenol (CHLORASEPTIC) mouth spray 1 spray  1 spray Mouth/Throat PRN Carole Civil, MD       methocarbamol (ROBAXIN) tablet 500 mg  500 mg Oral Q6H PRN Carole Civil, MD       Or   methocarbamol (ROBAXIN) 500 mg in dextrose 5 % 50 mL IVPB  500 mg Intravenous Q6H PRN Carole Civil, MD       metoCLOPramide (REGLAN) tablet 5-10 mg  5-10 mg Oral Q8H PRN Carole Civil, MD       Or   metoCLOPramide (REGLAN) injection 5-10 mg  5-10 mg Intravenous Q8H PRN Carole Civil, MD   5 mg at 03/13/21 2007   metoprolol tartrate (LOPRESSOR) tablet 50 mg  50 mg Oral Daily Carole Civil, MD   50 mg at 03/15/21 1044   morphine 2 MG/ML injection 0.5-1 mg  0.5-1 mg Intravenous Q2H PRN Carole Civil, MD   1 mg at 03/13/21 2006   ondansetron (ZOFRAN) tablet 4 mg  4 mg Oral Q6H PRN Carole Civil, MD       Or   ondansetron [facility]) injection 4 mg  4 mg Intravenous Q6H PRN Carole Civil, MD       pantoprazole (PROTONIX) EC tablet 40 mg  40 mg Oral Daily Carole Civil, MD   40 mg at 03/15/21 1044   phenylephrine (NEO-SYNEPHRINE) 20 mg in sodium chloride 0.9 % 250 mL (0.08 mg/mL) infusion  25-200 mcg/min Intravenous Titrated Carole Civil, MD   Stopped at 03/15/21 0122   polyethylene glycol (MIRALAX / GLYCOLAX) packet 17 g  17 g Oral Daily Carole Civil, MD   17 g at 03/15/21 1046   polyvinyl alcohol  (LIQUIFILM TEARS) 1.4 % ophthalmic solution 1 drop  1 drop Both Eyes TID PRN Carole Civil, MD       traMADol Veatrice [facility]bon) tablet 50 mg  50 mg Oral Q6H Carole Civil, MD   50 mg at 03/15/21 1048   tranexamic acid (CYKLOKAPRON) IVPB 1,000 mg  1,000 mg Intravenous Once Carole Civil, MD       triamcinolone cream (  KENALOG) 0.1 % cream 1 application  1 application Topical BID PRN Carole Civil, MD       triamcinolone cream (KENALOG) 0.1 % cream 1 application  1 application Topical TID Carole Civil, MD   1 application at 93/90/30 1051     Discharge Medications: Please see discharge summary for a list of discharge medications.  Relevant Imaging Results:  Relevant Lab Results:   Additional Information PT SSN [id number]  Natasha Bence, LCSW

## 2021-03-15 NOTE — TOC Initial Note (Signed)
Transition of Care [facility]) - Initial/Assessment Note    Patient Details  Name: Jennifer Bennett MRN: [id number] Date of Birth: [birth year]  Transition of Care [facility]) CM/SW Contact:    Natasha Bence, LCSW Phone Number: 03/15/2021, 3:30 PM  Clinical Narrative:                 Patient is a 75 year old female admitted for hip fracture. CSW conducted initial assessment. Patient she is able to ambulate independently at baseline and complete ADL's independently. Patient is agreeable to SNF referral to [facility] center. Patient would like for CIR to be considered. Patient not agreeable to [facility] or [place]. Patient is fully vaccinated for Covid 19. CSW referred patient and started [place]. TOC to follow.  Expected Discharge Plan: Skilled Nursing Facility Barriers to Discharge: Continued Medical Work up   Patient Goals and CMS Choice Patient states their goals for this hospitalization and ongoing recovery are:: Rehab CMS Medicare.gov Compare Post Acute Care list provided to:: Patient Choice offered to / list presented to : Patient  Expected Discharge Plan and Services Expected Discharge Plan: [place]                                              Prior Living Arrangements/Services   Lives with:: Self Patient language and need for interpreter reviewed:: Yes Do you feel safe going back to the place where you live?: Yes      Need for Family Participation in Patient Care: Yes (Comment) Care giver support system in place?: Yes (comment)   Criminal Activity/Legal Involvement Pertinent to Current Situation/Hospitalization: No - Comment as needed  Activities of Daily Living Home Assistive Devices/Equipment: None ADL Screening (condition at time of admission) Patient's cognitive ability adequate to safely complete daily activities?: Yes Is the patient deaf or have difficulty hearing?: No Does the patient have difficulty seeing, even when wearing glasses/contacts?:  No Does the patient have difficulty concentrating, remembering, or making decisions?: No Patient able to express need for assistance with ADLs?: Yes Does the patient have difficulty dressing or bathing?: No Independently performs ADLs?: Yes (appropriate for developmental age) Does the patient have difficulty walking or climbing stairs?: Yes Weakness of Legs: Both Weakness of Arms/Hands: None  Permission Sought/Granted Permission sought to share information with : Family Supports Permission granted to share information with : Yes, Verbal Permission Granted  Share Information with NAME: Hassel Neth  Permission granted to share info w AGENCY: [place] granted to share info w Relationship: (Sister)  Permission granted to share info w Contact Information: [phone]  Emotional Assessment     Affect (typically observed): Accepting, Adaptable Orientation: : Oriented to Self, Oriented to Situation, Oriented to Place, Oriented to  Time Alcohol / Substance Use: Not Applicable Psych Involvement: No (comment)  Admission diagnosis:  Osteoarthritis of left hip [M16.12] S/P total left hip arthroplasty [O27.035] Patient Active Problem List   Diagnosis Date Noted   Osteoarthritis of left hip 03/13/2021   S/P total left hip arthroplasty 03/13/2021   Unilateral primary osteoarthritis, left hip    Special screening for malignant neoplasms, colon    Primary osteoarthritis of right knee 01/22/2014   Gout of big toe 07/24/2013   Osteoarthritis of right knee 07/24/2013   OA (osteoarthritis) of knee 06/20/2012   Effusion of knee joint 05/16/2012   Arthritis of knee,  degenerative 05/16/2012   Knee pain 05/16/2012   KNEE PAIN 06/17/2009   COUGH 02/12/2009   HIP, ARTHRITIS, DEGEN./OSTEO 12/06/2007   HYPERLIPIDEMIA 04/18/2007   OBESITY, TRUNCAL 04/18/2007   TOBACCO USER 04/18/2007   HYPERTENSION 04/18/2007   ALLERGIC RHINITIS 04/18/2007   PULMONARY NODULE 04/18/2007   DYSPNEA  04/18/2007   PCP:  Iona Beard, MD Pharmacy:   [place], [place] - [id number] [place] [address] Phone: [phone] Fax: [phone] - [place], [address] [place] [address] Phone: [phone] Fax: [phone]  [facility] ([place], [place] [facility] [address] [place] [address] [place] [address] Phone: [phone] Fax: [phone]     Social Determinants of Health (SDOH) Interventions    Readmission Risk Interventions No flowsheet data found.

## 2021-03-15 NOTE — Progress Notes (Signed)
Patient ID: Jennifer Bennett, female   DOB: 09-13-1945, 75 y.o.   MRN: [id number] POD 2 LEFT THA W/ AUTOGRAFT   BP 124/65   Pulse 83   Temp 98.3 F (36.8 C) (Oral)   Resp (!) 23   Ht 5' 2.5" (1.588 m)   Wt 103.1 kg   SpO2 98%   BMI 40.91 kg/m   POST OP labile BP treated with neosyneph drip  Held Hydrodiuril yesterday evening, continued metoprolol.  CBC Latest Ref Rng & Units 03/15/2021 03/14/2021 03/13/2021  WBC 4.0 - 10.5 K/uL 13.6(H) 10.4 10.1  Hemoglobin 12.0 - 15.0 g/dL 8.4(L) 9.2(L) 9.3(L)  Hematocrit 36.0 - 46.0 % 26.8(L) 29.7(L) 29.8(L)  Platelets 150 - 400 K/uL 244 277 276   BMP Latest Ref Rng & Units 03/14/2021 03/13/2021 03/10/2021  Glucose 70 - 99 mg/dL 102(H) 125(H) 101(H)  BUN 8 - 23 mg/dL 17 17 17   Creatinine 0.44 - 1.00 mg/dL 0.80 0.75 0.84  Sodium 135 - 145 mmol/L 138 137 140  Potassium 3.5 - 5.1 mmol/L 3.3(L) 3.4(L) 4.2  Chloride 98 - 111 mmol/L 107 104 102  CO2 22 - 32 mmol/L 27 27 28   Calcium 8.9 - 10.3 mg/dL 8.3(L) 8.3(L) 10.4(H)   Continue PT   Will need post op skilled care

## 2021-03-16 ENCOUNTER — Telehealth: Payer: Self-pay | Admitting: Radiology

## 2021-03-16 ENCOUNTER — Encounter (HOSPITAL_COMMUNITY): Payer: Self-pay | Admitting: Orthopedic Surgery

## 2021-03-16 ENCOUNTER — Other Ambulatory Visit: Payer: Self-pay

## 2021-03-16 ENCOUNTER — Telehealth: Payer: Self-pay | Admitting: Orthopedic Surgery

## 2021-03-16 LAB — CBC WITH DIFFERENTIAL/PLATELET
Abs Immature Granulocytes: 0.09 10*3/uL — ABNORMAL HIGH (ref 0.00–0.07)
Basophils Absolute: 0.1 10*3/uL (ref 0.0–0.1)
Basophils Relative: 0 %
Eosinophils Absolute: 0.1 10*3/uL (ref 0.0–0.5)
Eosinophils Relative: 1 %
HCT: 25.7 % — ABNORMAL LOW (ref 36.0–46.0)
Hemoglobin: 7.9 g/dL — ABNORMAL LOW (ref 12.0–15.0)
Immature Granulocytes: 1 %
Lymphocytes Relative: 16 %
Lymphs Abs: 2.4 10*3/uL (ref 0.7–4.0)
MCH: 26.9 pg (ref 26.0–34.0)
MCHC: 30.7 g/dL (ref 30.0–36.0)
MCV: 87.4 fL (ref 80.0–100.0)
Monocytes Absolute: 1 10*3/uL (ref 0.1–1.0)
Monocytes Relative: 7 %
Neutro Abs: 11.3 10*3/uL — ABNORMAL HIGH (ref 1.7–7.7)
Neutrophils Relative %: 75 %
Platelets: 293 10*3/uL (ref 150–400)
RBC: 2.94 MIL/uL — ABNORMAL LOW (ref 3.87–5.11)
RDW: 13.6 % (ref 11.5–15.5)
WBC: 15 10*3/uL — ABNORMAL HIGH (ref 4.0–10.5)
nRBC: 0 % (ref 0.0–0.2)

## 2021-03-16 LAB — BASIC METABOLIC PANEL
Anion gap: 6 (ref 5–15)
BUN: 25 mg/dL — ABNORMAL HIGH (ref 8–23)
CO2: 27 mmol/L (ref 22–32)
Calcium: 8.4 mg/dL — ABNORMAL LOW (ref 8.9–10.3)
Chloride: 105 mmol/L (ref 98–111)
Creatinine, Ser: 0.72 mg/dL (ref 0.44–1.00)
GFR, Estimated: >60 mL/min (ref >60)
Glucose, Bld: 83 mg/dL (ref 70–99)
Potassium: 3.3 mmol/L — ABNORMAL LOW (ref 3.5–5.1)
Sodium: 138 mmol/L (ref 135–145)

## 2021-03-16 MED ORDER — NYSTATIN 100000 UNIT/GM EX CREA
1.0000 "application " | TOPICAL_CREAM | Freq: Two times a day (BID) | CUTANEOUS | Status: DC
  Administered 2021-03-16 – 2021-03-17 (×3): 1 via TOPICAL
  Filled 2021-03-16: qty 15

## 2021-03-16 MED ORDER — FERROUS SULFATE 325 (65 FE) MG PO TABS
325.0000 mg | ORAL_TABLET | Freq: Three times a day (TID) | ORAL | Status: DC
  Administered 2021-03-16 – 2021-03-17 (×5): 325 mg via ORAL
  Filled 2021-03-16 (×5): qty 1

## 2021-03-16 MED ORDER — METOPROLOL TARTRATE 25 MG PO TABS
25.0000 mg | ORAL_TABLET | Freq: Two times a day (BID) | ORAL | Status: DC
Start: 2021-03-16 — End: 2021-03-17
  Administered 2021-03-16 – 2021-03-17 (×3): 25 mg via ORAL
  Filled 2021-03-16 (×3): qty 1

## 2021-03-16 NOTE — Telephone Encounter (Signed)
-----   Message from [facility] May, RT sent at 03/13/2021 10:08 AM EST ----- Regarding: IP Rehab PA Request I s/w Andie at [place], she said she had an expedited IP Rehab PA Request for patient.  She did not see auth for surgery, so she said best course of action was to cancel the IP Rehab request until surgery done, then social worker at hospital would request IP Rehab.  So that is what she did.  FYI I just checked and surg PA still pending.

## 2021-03-16 NOTE — Telephone Encounter (Signed)
I called patient to let her know about her new appt on 03/26/21 and she has some questions.  She wants to know what type of hip Dr. Aline Bennett put in.   Please call her back on her cell phone.

## 2021-03-16 NOTE — Progress Notes (Signed)
Patient ID: Jennifer Bennett, female   DOB: Oct 03, 1945, 75 y.o.   MRN: [id number] DR Aline Brochure prefer spatient be sent to [place] center

## 2021-03-16 NOTE — TOC Progression Note (Signed)
Pts PASRR is [id number].

## 2021-03-16 NOTE — Progress Notes (Signed)
Physical Therapy Treatment Patient Details Name: Jennifer Bennett MRN: [id number] DOB: 1946/02/15 Today's Date: 03/16/2021   History of Present Illness 75 year old female with hypertension worsening left hip pain status post lumbar fusion.  The patient is concerned that now she is having more pain and now she is unable to walk and has to ambulate in a wheelchair.  She was on Tylenol 3 her primary care doctor switched her over to hydrocodone 10 with little relief of pain    She has 2 primary areas of concern in terms of pain 1 is in the groin and thigh and one is in the buttock and lateral leg. L THA 12/2.    PT Comments    Patient demonstrates increased endurance/distance for taking steps in room with slow labored cadence, no loss of balance and limited mostly due to fatigue.  Patient able to transfer to raised toilet seat in bathroom and tolerated sitting up in chair after therapy - RN notified.  Patient will benefit from continued skilled physical therapy in hospital and recommended venue below to increase strength, balance, endurance for safe ADLs and gait.     Recommendations for follow up therapy are one component of a multi-disciplinary discharge planning process, led by the attending physician.  Recommendations may be updated based on patient status, additional functional criteria and insurance authorization.  Follow Up Recommendations  Skilled nursing-short term rehab (<3 hours/day)     Assistance Recommended at Discharge    Equipment Recommendations  None recommended by PT    Recommendations for Other Services       Precautions / Restrictions Precautions Precaution Booklet Issued: No Precaution Comments: direct lateral Restrictions Weight Bearing Restrictions: Yes LLE Weight Bearing: Weight bearing as tolerated     Mobility  Bed Mobility Overal bed mobility: Needs Assistance Bed Mobility: Supine to Sit     Supine to sit: Min assist;Mod assist     General bed  mobility comments: increased time labored movement    Transfers Overall transfer level: Needs assistance Equipment used: Rolling walker (2 wheels) Transfers: Sit to/from Stand;Bed to chair/wheelchair/BSC Sit to Stand: Min assist     Step pivot transfers: Min assist     General transfer comment: good return for transferring to raised toliet seat in bathroom and to chair at bedside    Ambulation/Gait Ambulation/Gait assistance: Min assist Gait Distance (Feet): 15 Feet Assistive device: Rolling walker (2 wheels) Gait Pattern/deviations: Step-to pattern;Decreased step length - right;Decreased step length - left;Decreased stance time - left;Decreased stride length;Antalgic Gait velocity: decreased     General Gait Details: increased endurance/distance for taking steps in room with slow labored movemet, no loss of balance, limited mostly due to c/o fatigue and mild dizziness   Stairs             Wheelchair Mobility    Modified Rankin (Stroke Patients Only)       Balance Overall balance assessment: Needs assistance Sitting-balance support: Feet supported;No upper extremity supported Sitting balance-Leahy Scale: Fair Sitting balance - Comments: fair/good seated at EOB   Standing balance support: Reliant on assistive device for balance;During functional activity;Bilateral upper extremity supported Standing balance-Leahy Scale: Fair Standing balance comment: using RW                            Cognition Arousal/Alertness: Awake/alert Behavior During Therapy: WFL for tasks assessed/performed Overall Cognitive Status: Within Functional Limits for tasks assessed  Exercises Total Joint Exercises Ankle Circles/Pumps: Supine;10 reps;Both;Strengthening;AROM Quad Sets: Supine;10 reps;Left;Strengthening;AROM Gluteal Sets: Supine;10 reps;Both;Strengthening;AROM Heel Slides: Supine;10  reps;Strengthening;Left;AROM Long Arc Quad: Seated;AAROM;Strengthening;10 reps    General Comments        Pertinent Vitals/Pain Pain Assessment: Faces Pain Score: 2  Pain Location: hip L Pain Descriptors / Indicators: Sore Pain Intervention(s): Limited activity within patient's tolerance;Monitored during session;Repositioned    Home Living                          Prior Function            PT Goals (current goals can now be found in the care plan section) Acute Rehab PT Goals Patient Stated Goal: improve strength, be safe to return home PT Goal Formulation: With patient Time For Goal Achievement: 03/28/21 Potential to Achieve Goals: Good Progress towards PT goals: Progressing toward goals    Frequency    Min 5X/week      PT Plan Current plan remains appropriate    Co-evaluation              AM-PAC PT "6 Clicks" Mobility   Outcome Measure  Help needed turning from your back to your side while in a flat bed without using bedrails?: A Lot Help needed moving from lying on your back to sitting on the side of a flat bed without using bedrails?: A Lot Help needed moving to and from a bed to a chair (including a wheelchair)?: A Little Help needed standing up from a chair using your arms (e.g., wheelchair or bedside chair)?: A Little Help needed to walk in hospital room?: A Little Help needed climbing 3-5 steps with a railing? : A Lot 6 Click Score: 15    End of Session Equipment Utilized During Treatment: Gait belt Activity Tolerance: Patient tolerated treatment well;Patient limited by fatigue Patient left: in chair;with call bell/phone within reach Nurse Communication: Mobility status PT Visit Diagnosis: Unsteadiness on feet (R26.81);Other abnormalities of gait and mobility (R26.89);Muscle weakness (generalized) (M62.81)     Time: 1610-9604 PT Time Calculation (min) (ACUTE ONLY): 33 min  Charges:  $Therapeutic Exercise: 8-22 mins $Therapeutic  Activity: 8-22 mins                     3:18 PM, 03/16/21 Lonell Grandchild, MPT Physical Therapist with [facility] 336 [phone] office [phone] mobile phone

## 2021-03-16 NOTE — TOC Progression Note (Signed)
Transition of Care [facility]) - Progression Note    Patient Details  Name: Jennifer Bennett MRN: [id number] Date of Birth: Oct 23, 1945  Transition of Care [facility]) CM/SW [place], [place] Phone Number: 03/16/2021, 4:01 PM  Clinical Narrative:    CSW spoke to pt to inform that [facility] has a bed and would be able to accept for SNF. Pt is very thankful and agreeable to this. CSW spoke to [place] with the [facility] who states they can take when pt is medically stable. Pts insurance Josem Kaufmann has been approved. TOC to follow.   Expected Discharge Plan: Skilled Nursing Facility Barriers to Discharge: Continued Medical Work up  Expected Discharge Plan and Services Expected Discharge Plan: [place]                                               Social Determinants of Health (SDOH) Interventions    Readmission Risk Interventions No flowsheet data found.

## 2021-03-16 NOTE — Telephone Encounter (Signed)
IMPLANTS: DEPUY

## 2021-03-17 ENCOUNTER — Inpatient Hospital Stay
Admission: RE | Admit: 2021-03-17 | Discharge: 2021-04-03 | Disposition: A | Discharge status: Home / Self Care | Payer: Medicare Other | Source: Ambulatory Visit | Attending: Internal Medicine | Admitting: Internal Medicine

## 2021-03-17 LAB — CBC WITH DIFFERENTIAL/PLATELET
Abs Immature Granulocytes: 0.09 10*3/uL — ABNORMAL HIGH (ref 0.00–0.07)
Basophils Absolute: 0.1 10*3/uL (ref 0.0–0.1)
Basophils Relative: 1 %
Eosinophils Absolute: 0.4 10*3/uL (ref 0.0–0.5)
Eosinophils Relative: 4 %
HCT: 25.9 % — ABNORMAL LOW (ref 36.0–46.0)
Hemoglobin: 8 g/dL — ABNORMAL LOW (ref 12.0–15.0)
Immature Granulocytes: 1 %
Lymphocytes Relative: 20 %
Lymphs Abs: 2.5 10*3/uL (ref 0.7–4.0)
MCH: 27.1 pg (ref 26.0–34.0)
MCHC: 30.9 g/dL (ref 30.0–36.0)
MCV: 87.8 fL (ref 80.0–100.0)
Monocytes Absolute: 0.9 10*3/uL (ref 0.1–1.0)
Monocytes Relative: 8 %
Neutro Abs: 8.3 10*3/uL — ABNORMAL HIGH (ref 1.7–7.7)
Neutrophils Relative %: 66 %
Platelets: 360 10*3/uL (ref 150–400)
RBC: 2.95 MIL/uL — ABNORMAL LOW (ref 3.87–5.11)
RDW: 13.6 % (ref 11.5–15.5)
WBC: 12.2 10*3/uL — ABNORMAL HIGH (ref 4.0–10.5)
nRBC: 0 % (ref 0.0–0.2)

## 2021-03-17 LAB — BASIC METABOLIC PANEL
Anion gap: 8 (ref 5–15)
BUN: 22 mg/dL (ref 8–23)
CO2: 27 mmol/L (ref 22–32)
Calcium: 8.4 mg/dL — ABNORMAL LOW (ref 8.9–10.3)
Chloride: 105 mmol/L (ref 98–111)
Creatinine, Ser: 0.62 mg/dL (ref 0.44–1.00)
GFR, Estimated: >60 mL/min (ref >60)
Glucose, Bld: 85 mg/dL (ref 70–99)
Potassium: 3.8 mmol/L (ref 3.5–5.1)
Sodium: 140 mmol/L (ref 135–145)

## 2021-03-17 MED ORDER — POLYETHYLENE GLYCOL 3350 17 G PO PACK: 17.0000 g | PACK | Freq: Every day | ORAL | 0 refills | Status: AC

## 2021-03-17 MED ORDER — TRAMADOL HCL 50 MG PO TABS: 50.0000 mg | ORAL_TABLET | Freq: Four times a day (QID) | ORAL | 0 refills | Status: AC

## 2021-03-17 MED ORDER — METHOCARBAMOL 500 MG PO TABS: 500.0000 mg | ORAL_TABLET | Freq: Four times a day (QID) | ORAL | 0 refills | Status: DC | PRN

## 2021-03-17 MED ORDER — TRAMADOL HCL 50 MG PO TABS
50.0000 mg | ORAL_TABLET | Freq: Four times a day (QID) | ORAL | 0 refills | Status: DC
Start: 2021-03-17 — End: 2021-03-17

## 2021-03-17 MED ORDER — NYSTATIN 100000 UNIT/GM EX CREA: 1.0000 "application " | TOPICAL_CREAM | Freq: Two times a day (BID) | CUTANEOUS | 0 refills | Status: DC

## 2021-03-17 MED ORDER — HYDROCODONE-ACETAMINOPHEN 10-325 MG PO TABS: 1.0000 | ORAL_TABLET | ORAL | 0 refills | Status: DC | PRN

## 2021-03-17 MED ORDER — DOCUSATE SODIUM 100 MG PO CAPS: 100.0000 mg | ORAL_CAPSULE | Freq: Two times a day (BID) | ORAL | 0 refills | Status: DC

## 2021-03-17 MED ORDER — FERROUS SULFATE 325 (65 FE) MG PO TABS: 325.0000 mg | ORAL_TABLET | Freq: Three times a day (TID) | ORAL | 3 refills | Status: DC

## 2021-03-17 NOTE — Progress Notes (Signed)
Patient's left his is warm to touch and moderate amount of drainage on bandage. Patient denies pain. Will continue to monitor

## 2021-03-17 NOTE — TOC Transition Note (Signed)
Transition of Care [facility]) - CM/SW Discharge Note   Patient Details  Name: Jennifer Bennett MRN: [id number] Date of Birth: 1945-12-01  Transition of Care [facility]) CM/SW Contact:  Iona Beard, [place] Phone Number: 03/17/2021, 1:37 PM   Clinical Narrative:    CSW spoke to [place] with at [facility] who states they are good for admission today. CSW sent D/C summary and orders over in the [place]. CSW spoke to pts sister to update that pt will be d/c today. CSW updated RN that report could be called when she was ready. TOC signing off.   Final next level of care: Skilled Nursing Facility Barriers to Discharge: Barriers Resolved   Patient Goals and CMS Choice Patient states their goals for this hospitalization and ongoing recovery are:: Go to SNF CMS Medicare.gov Compare Post Acute Care list provided to:: Patient Choice offered to / list presented to : Patient  Discharge Placement                  Name of family member notified: Hassel Neth (Sister)   [phone] Patient and family notified of of transfer: 03/17/21  Discharge Plan and Services                                     Social Determinants of Health (SDOH) Interventions     Readmission Risk Interventions No flowsheet data found.

## 2021-03-17 NOTE — Plan of Care (Signed)
  Problem: Nutrition: Goal: Adequate nutrition will be maintained Outcome: Adequate for Discharge   Problem: Coping: Goal: Level of anxiety will decrease Outcome: Adequate for Discharge

## 2021-03-17 NOTE — Care Management Important Message (Signed)
Important Message  Patient Details  Name: Jennifer Bennett MRN: [id number] Date of Birth: 16-Sep-1945   Medicare Important Message Given:  Yes     Tommy Medal 03/17/2021, 12:59 PM

## 2021-03-17 NOTE — Progress Notes (Signed)
In for am assessment. Pt states did not rest well over night, states feels like "down in a hole in the bed". Pt assisted up to [facility] with FWW, tolerated well. Voided clear yellow urine.  Dsg to left hip noted to be saturated with serosanguinous drainage to the point that drainage is running down pt's leg into her sock. Once pt finished using BSC, pt able to stand and pericare performed. Then dsg removed from left leg, staples intact to incision with 3 small areas of oozing blood. Periwound area cleaned and new mepilex foam dressings x2 applied. Pt then stood and transferred to recliner. Pt tolerated well and denies any c/o pain in left hip, just states, "feels a little stiff".

## 2021-03-17 NOTE — Discharge Summary (Signed)
Physician Discharge Summary  Patient ID: Jennifer Bennett MRN: [id number] DOB/AGE: [birth year] 75 y.o.  Admit date: 03/13/2021 Discharge date: 03/17/2021  Admission Diagnoses: Osteoarthritis left hip  Discharge Diagnoses:  Principal Problem:   Osteoarthritis of left hip Active Problems:   Unilateral primary osteoarthritis, left hip   S/P total left hip arthroplasty Postoperative hypotension  Acute blood loss anemia  Discharged Condition: good  Postoperative appointment December 15  Hospital Course: The patient came in for a left total hip arthroplasty on December 2.  She had an uncomplicated course and had successful surgery.  However during the surgery she required Neo-Synephrine to maintain her blood pressure and in the PACU after 3-1/2 L of fluid still had a labile blood pressure so she was kept in the ICU on Neo-Synephrine drip until Sunday, December 4.  After tapering the neo and stopping her HydroDIURIL her blood pressure stabilized and she was allowed to go to the floor  She tolerated physical therapy well starting actually on Saturday through Sunday never had any symptoms from the hypotension.  Her hemoglobin did drop down to about 7.9 and she was treated with iron instead of blood transfusion  Again remained completely asymptomatic   Consults: None  Significant Diagnostic Studies: labs:   CBC Latest Ref Rng & Units 03/17/2021 03/16/2021 03/15/2021  WBC 4.0 - 10.5 K/uL 12.2(H) 15.0(H) 13.6(H)  Hemoglobin 12.0 - 15.0 g/dL 8.0(L) 7.9(L) 8.4(L)  Hematocrit 36.0 - 46.0 % 25.9(L) 25.7(L) 26.8(L)  Platelets 150 - 400 K/uL 360 293 244   BMP Latest Ref Rng & Units 03/17/2021 03/16/2021 03/15/2021  Glucose 70 - 99 mg/dL 85 83 99  BUN 8 - 23 mg/dL 22 25(H) 21  Creatinine 0.44 - 1.00 mg/dL 0.62 0.72 0.69  Sodium 135 - 145 mmol/L 140 138 137  Potassium 3.5 - 5.1 mmol/L 3.8 3.3(L) 3.2(L)  Chloride 98 - 111 mmol/L 105 105 103  CO2 22 - 32 mmol/L 27 27 28   Calcium 8.9 - 10.3  mg/dL 8.4(L) 8.4(L) 8.4(L)    Implants DePuy Tri-Lock stem size 2  with size 52 cup  36 metal head  36+4 polyethylene  Direct lateral approach  Discharge Exam: Blood pressure 131/72, pulse 85, temperature 98.6 F (37 C), temperature source Oral, resp. rate 17, height 5' 2.5" (1.588 m), weight 103.1 kg, SpO2 98 %. Awake alert oriented x3 Mood affect pleasant Neurologic exam normal Musculoskeletal exam minimal thigh swelling scant drainage dressing muscle tone and strength dorsiflexion plantarflexion normal  Disposition: Discharge disposition: 03-Skilled Nursing Facility, [place] center       Discharge Instructions     Call MD / Call 911   Complete by: As directed    If you experience chest pain or shortness of breath, CALL 911 and be transported to the hospital emergency room.  If you develope a fever above 101 F, pus (white drainage) or increased drainage or redness at the wound, or calf pain, call your surgeon's office.   Constipation Prevention   Complete by: As directed    Drink plenty of fluids.  Prune juice may be helpful.  You may use a stool softener, such as Colace (over the counter) 100 mg twice a day.  Use MiraLax (over the counter) for constipation as needed.   Diet - low sodium heart healthy   Complete by: As directed    Discharge instructions   Complete by: As directed    Weight bearing as tolerated.  Direct lateral approach   Staples out at 14  days post op   Change dressing daily as needed   Increase activity slowly as tolerated   Complete by: As directed    Post-operative opioid taper instructions:   Complete by: As directed    POST-OPERATIVE OPIOID TAPER INSTRUCTIONS: It is important to wean off of your opioid medication as soon as possible. If you do not need pain medication after your surgery it is ok to stop day one. Opioids include: Codeine, Hydrocodone(Norco, Vicodin), Oxycodone(Percocet, oxycontin) and hydromorphone amongst others.  Long  term and even short term use of opiods can cause: Increased pain response Dependence Constipation Depression Respiratory depression And more.  Withdrawal symptoms can include Flu like symptoms Nausea, vomiting And more Techniques to manage these symptoms Hydrate well Eat regular healthy meals Stay active Use relaxation techniques(deep breathing, meditating, yoga) Do Not substitute Alcohol to help with tapering If you have been on opioids for less than two weeks and do not have pain than it is ok to stop all together.  Plan to wean off of opioids This plan should start within one week post op of your joint replacement. Maintain the same interval or time between taking each dose and first decrease the dose.  Cut the total daily intake of opioids by one tablet each day Next start to increase the time between doses. The last dose that should be eliminated is the evening dose.         Allergies as of 03/17/2021       Reactions   Ace Inhibitors Swelling   Swelling of lips/mouth        Medication List     STOP taking these medications    hydrochlorothiazide 25 MG tablet Commonly known as: HYDRODIURIL   metoprolol tartrate 50 MG tablet Commonly known as: LOPRESSOR       TAKE these medications    allopurinol 100 MG tablet Commonly known as: ZYLOPRIM Take 100 mg by mouth daily.   aspirin EC 81 MG tablet Take 81 mg daily by mouth.   Aspirin-Salicylamide-Caffeine 026-378-58.8 MG Pack Take 1 packet daily as needed by mouth (for pain.).   betamethasone dipropionate 0.05 % cream Apply 1 application topically 2 (two) times daily as needed (eczema).   diclofenac 50 MG tablet Commonly known as: CATAFLAM Take 1 tablet (50 mg total) by mouth 2 (two) times daily.   docusate sodium 100 MG capsule Commonly known as: COLACE Take 1 capsule (100 mg total) by mouth 2 (two) times daily.   ferrous sulfate 325 (65 FE) MG tablet Take 1 tablet (325 mg total) by mouth 3  (three) times daily with meals.   gemfibrozil 600 MG tablet Commonly known as: LOPID Take 600 mg by mouth daily.   HYDROcodone-acetaminophen 10-325 MG tablet Commonly known as: Norco Take 1 tablet by mouth every 4 (four) hours as needed. What changed:  when to take this reasons to take this   methocarbamol 500 MG tablet Commonly known as: ROBAXIN Take 1 tablet (500 mg total) by mouth every 6 (six) hours as needed for up to 60 doses for muscle spasms.   nystatin cream Commonly known as: MYCOSTATIN Apply 1 application topically 2 (two) times daily.   polyethylene glycol 17 g packet Commonly known as: MIRALAX / GLYCOLAX Take 17 g by mouth daily. Start taking on: March 18, 2021   traMADol 50 MG tablet Commonly known as: ULTRAM Take 1 tablet (50 mg total) by mouth 4 (four) times daily for 5 days.   triamcinolone cream 0.1 %  Commonly known as: KENALOG Apply 1 application topically 2 (two) times daily as needed (eczema).   VISINE TOTALITY MULTI-SYMPTOM OP Apply 1 drop 3 (three) times daily as needed to eye (for dry/irritated eyes.).         Signed: Arther Abbott 03/17/2021, 12:37 PM

## 2021-03-17 NOTE — Progress Notes (Signed)
Physical Therapy Treatment Patient Details Name: Jennifer Bennett MRN: [id number] DOB: [birth year] Today's Date: 03/17/2021   History of Present Illness 75 year old female with hypertension worsening left hip pain status post lumbar fusion.  The patient is concerned that now she is having more pain and now she is unable to walk and has to ambulate in a wheelchair.  She was on Tylenol 3 her primary care doctor switched her over to hydrocodone 10 with little relief of pain    She has 2 primary areas of concern in terms of pain 1 is in the groin and thigh and one is in the buttock and lateral leg. L THA 12/2.    PT Comments    Patient presents seated in chair (assisted by nursing staff) and agreeable for therapy.  Patient demonstrates increased endurance/distance for gait training and able to ambulate out of room with slow labored cadence with fair/good return for left heel to toe stepping and limited mostly due to fatigue and mild increase in left hip pain with movement.  Patient tolerated staying up in chair after therapy - RN aware.  Patient will benefit from continued skilled physical therapy in hospital and recommended venue below to increase strength, balance, endurance for safe ADLs and gait.     Recommendations for follow up therapy are one component of a multi-disciplinary discharge planning process, led by the attending physician.  Recommendations may be updated based on patient status, additional functional criteria and insurance authorization.  Follow Up Recommendations  Skilled nursing-short term rehab (<3 hours/day)     Assistance Recommended at Discharge Intermittent Supervision/Assistance  Equipment Recommendations  None recommended by PT    Recommendations for Other Services       Precautions / Restrictions Precautions Precautions: Fall Precaution Comments: direct lateral Restrictions Weight Bearing Restrictions: Yes LLE Weight Bearing: Weight bearing as tolerated      Mobility  Bed Mobility               General bed mobility comments: Patient presents seated in chair (assisted by nursing staff)    Transfers Overall transfer level: Needs assistance Equipment used: Rolling walker (2 wheels) Transfers: Sit to/from Stand;Bed to chair/wheelchair/BSC Sit to Stand: Min assist;Mod assist     Step pivot transfers: Min assist     General transfer comment: had difficulty with sit to stands due to requiring verbal cues for proper hand placement with fair carryover    Ambulation/Gait Ambulation/Gait assistance: Min assist Gait Distance (Feet): 30 Feet Assistive device: Rolling walker (2 wheels) Gait Pattern/deviations: Step-to pattern;Decreased step length - right;Decreased step length - left;Decreased stance time - left;Decreased stride length;Antalgic Gait velocity: decreased     General Gait Details: demonstrates increased endurance/distance and able to ambulate outside of room with slow labored cadence with fair/good return for left heel to toe stepping   Stairs             Wheelchair Mobility    Modified Rankin (Stroke Patients Only)       Balance Overall balance assessment: Needs assistance Sitting-balance support: Feet supported;No upper extremity supported Sitting balance-Leahy Scale: Good Sitting balance - Comments: seated at EOB   Standing balance support: Reliant on assistive device for balance;During functional activity;Bilateral upper extremity supported Standing balance-Leahy Scale: Fair Standing balance comment: using RW                            Cognition Arousal/Alertness: Awake/alert Behavior During Therapy: WFL for tasks  assessed/performed Overall Cognitive Status: Within Functional Limits for tasks assessed                                          Exercises General Exercises - Lower Extremity Long Arc Quad: Seated;AROM;AAROM;Strengthening;Both;10 reps Hip  Flexion/Marching: Seated;AROM;AAROM;Strengthening;Both;10 reps Toe Raises: Seated;AROM;Strengthening;Both;15 reps Heel Raises: Seated;AROM;Strengthening;Both;15 reps    General Comments        Pertinent Vitals/Pain Pain Assessment: 0-10 Pain Score: 5  Pain Location: Left hip Pain Descriptors / Indicators: Sore Pain Intervention(s): Limited activity within patient's tolerance;Monitored during session;Premedicated before session    Home Living                          Prior Function            PT Goals (current goals can now be found in the care plan section) Acute Rehab PT Goals Patient Stated Goal: improve strength, be safe to return home PT Goal Formulation: With patient Time For Goal Achievement: 03/28/21 Potential to Achieve Goals: Good Progress towards PT goals: Progressing toward goals    Frequency    Min 5X/week      PT Plan Current plan remains appropriate    Co-evaluation              AM-PAC PT "6 Clicks" Mobility   Outcome Measure  Help needed turning from your back to your side while in a flat bed without using bedrails?: A Lot Help needed moving from lying on your back to sitting on the side of a flat bed without using bedrails?: A Lot Help needed moving to and from a bed to a chair (including a wheelchair)?: A Little Help needed standing up from a chair using your arms (e.g., wheelchair or bedside chair)?: A Little Help needed to walk in hospital room?: A Little Help needed climbing 3-5 steps with a railing? : A Lot 6 Click Score: 15    End of Session Equipment Utilized During Treatment: Gait belt Activity Tolerance: Patient tolerated treatment well;Patient limited by fatigue Patient left: in chair;with call bell/phone within reach Nurse Communication: Mobility status PT Visit Diagnosis: Unsteadiness on feet (R26.81);Other abnormalities of gait and mobility (R26.89);Muscle weakness (generalized) (M62.81)     Time: 9038-3338 PT  Time Calculation (min) (ACUTE ONLY): 29 min  Charges:  $Gait Training: 8-22 mins $Therapeutic Exercise: 8-22 mins                     12:12 PM, 03/17/21 Lonell Grandchild, MPT Physical Therapist with [facility] [phone] office [phone] mobile phone

## 2021-03-17 NOTE — Progress Notes (Signed)
Pt's left hip dressing over 1/2 saturated with serous drainage that has leaked out onto the bed pad underneath her. Mepilex dressing removed and new dressing applied. Most of drainage is noted from upper 1/3 (Proximal) end of incision. Again, pt with no c/o pain, working with PT without difficulty.

## 2021-03-17 NOTE — Progress Notes (Signed)
Pt transferred to [facility] via wheelchair by nurse tech. Pt's husband took possession of pt's belongings.

## 2021-03-18 ENCOUNTER — Ambulatory Visit: Payer: Medicare Other | Admitting: Orthopedic Surgery

## 2021-03-18 ENCOUNTER — Non-Acute Institutional Stay (SKILLED_NURSING_FACILITY): Payer: Medicare Other | Admitting: Adult Health

## 2021-03-18 ENCOUNTER — Encounter: Payer: Self-pay | Admitting: Adult Health

## 2021-03-18 DIAGNOSIS — E781 Pure hyperglyceridemia: Secondary | ICD-10-CM | POA: Diagnosis not present

## 2021-03-18 DIAGNOSIS — M109 Gout, unspecified: Secondary | ICD-10-CM | POA: Diagnosis not present

## 2021-03-18 DIAGNOSIS — M1612 Unilateral primary osteoarthritis, left hip: Secondary | ICD-10-CM

## 2021-03-18 DIAGNOSIS — D62 Acute posthemorrhagic anemia: Secondary | ICD-10-CM | POA: Diagnosis not present

## 2021-03-18 DIAGNOSIS — K5909 Other constipation: Secondary | ICD-10-CM

## 2021-03-18 NOTE — Progress Notes (Signed)
Location:  [facility] Room Number: 153-P Place of Service:  SNF (31)   CODE STATUS: Full Code  Allergies  Allergen Reactions   Ace Inhibitors Swelling    Swelling of lips/mouth    Chief Complaint  Patient presents with   Hospitalization Follow-up    HPI:  She is a 75 year old woman who has been hospitalized from 03-13-21 through 03-17-21. Her medical history includes obesity; gout; osteoarthritis. She had a total left hip replacement perform 03-13-21. Her hospitalization was complicated by postop anemia. She did receive a transfusion of pack cells. She is here for short term rehab with her goal to return back home. She would like to return back to work. She has been out of work since September due to her left hip pain. She will continue to be followed for her chronic illnesses including:   Gout of big toe:  Chronic constipation:  Hypertriglyceridemia  Past Medical History:  Diagnosis Date   Acid reflux    Arthritis    Eczema    High blood pressure    High cholesterol     Past Surgical History:  Procedure Laterality Date   ABDOMINAL HYSTERECTOMY     ANAL FISSURECTOMY     BACK SURGERY  Lumbar   CARPAL TUNNEL RELEASE Right    COLONOSCOPY N/A 02/21/2017   Procedure: COLONOSCOPY;  Surgeon: Danie Binder, MD;  Location: [facility] ENDO SUITE;  Service: Endoscopy;  Laterality: N/A;  8:30 am - patient refused to move up at all   [place] Left 03/13/2021   Procedure: Mobile;  Surgeon: Carole Civil, MD;  Location: [facility] ORS;  Service: Orthopedics;  Laterality: Left;   VESICOVAGINAL FISTULA CLOSURE W/ TAH      Social History   Socioeconomic History   Marital status: Widowed    Spouse name: Not on file   Number of children: Not on file   Years of education: Not on file   Highest education level: Not on file  Occupational History   Not on file  Tobacco Use   Smoking status: Former    Packs/day: 0.50     Years: 52.00    Pack years: 26.00    Types: Cigarettes    Quit date: 06/25/2014    Years since quitting: 6.7   Smokeless tobacco: Never  Vaping Use   Vaping Use: Never used  Substance and Sexual Activity   Alcohol use: No   Drug use: No   Sexual activity: Not on file  Other Topics Concern   Not on file  Social History Narrative   Not on file   Social Determinants of Health   Financial Resource Strain: Not on file  Food Insecurity: Not on file  Transportation Needs: Not on file  Physical Activity: Not on file  Stress: Not on file  Social Connections: Not on file  Intimate Partner Violence: Not on file   Family History  Problem Relation Age of Onset   Cancer Other    Arthritis Other    Asthma Other    Heart disease Father    Cancer Brother       VITAL SIGNS BP 122/76   Pulse 90   Temp 98.3 F (36.8 C)   Resp 20   Ht 5' 2.5" (1.588 m)   Wt 225 lb 6.4 oz (102.2 kg)   SpO2 96%   BMI 40.57 kg/m   Outpatient Encounter Medications as of  03/18/2021  Medication Sig Note   allopurinol (ZYLOPRIM) 100 MG tablet Take 100 mg by mouth daily.    aspirin EC 81 MG tablet Take 81 mg daily by mouth.    Aspirin-Salicylamide-Caffeine 299-371-69.6 MG PACK Take 1 packet daily as needed by mouth (for pain.). 03/04/2021: BC Powder   betamethasone dipropionate 0.05 % cream Apply 1 application topically 2 (two) times daily as needed (eczema).    docusate sodium (COLACE) 100 MG capsule Take 1 capsule (100 mg total) by mouth 2 (two) times daily.    ferrous sulfate 325 (65 FE) MG tablet Take 1 tablet (325 mg total) by mouth 3 (three) times daily with meals.    gemfibrozil (LOPID) 600 MG tablet Take 600 mg by mouth daily.    HYDROcodone-acetaminophen (NORCO) 10-325 MG tablet Take 1 tablet by mouth every 4 (four) hours as needed.    methocarbamol (ROBAXIN) 500 MG tablet Take 1 tablet (500 mg total) by mouth every 6 (six) hours as needed for up to 60 doses for muscle spasms.    polyethylene  glycol (MIRALAX / GLYCOLAX) 17 g packet Take 17 g by mouth daily.    Tetrahyd-Glyc-Hypro-PEG-ZnSulf (VISINE TOTALITY MULTI-SYMPTOM OP) Apply 1 drop 3 (three) times daily as needed to eye (for dry/irritated eyes.).    nystatin cream (MYCOSTATIN) Apply 1 application topically 2 (two) times daily.    traMADol (ULTRAM) 50 MG tablet Take 1 tablet (50 mg total) by mouth 4 (four) times daily for 5 days.    triamcinolone cream (KENALOG) 0.1 % Apply 1 application topically 2 (two) times daily as needed (eczema).    [DISCONTINUED] diclofenac (CATAFLAM) 50 MG tablet Take 1 tablet (50 mg total) by mouth 2 (two) times daily.    No facility-administered encounter medications on file as of 03/18/2021.     SIGNIFICANT DIAGNOSTIC EXAMS  TODAY  03-13-21: wbc 10.1; hgb 9.3; hct 29.8; mcv 88.2 plt 276 glucose 125; bun 17; creat 0.75; k+ 3.4; na++ 137; ca 8.3; liver normal albumin 2.5 mag 1.5 phos 2.5 03-17-21: wbc 12.2; hgb 8.0; hct 25.9; mcv 87,8 plt 360; glucose 85; bun 22; creat 0.62; k+ 3.8; na++ 140; ca 8.4 GFR>60   Review of Systems  Constitutional:  Negative for malaise/fatigue.  Respiratory:  Negative for cough and shortness of breath.   Cardiovascular:  Negative for chest pain, palpitations and leg swelling.  Gastrointestinal:  Negative for abdominal pain, constipation and heartburn.  Musculoskeletal:  Positive for joint pain. Negative for back pain and myalgias.       Left hip pain is managed   Skin: Negative.   Neurological:  Negative for dizziness.  Psychiatric/Behavioral:  The patient is not nervous/anxious.     Physical Exam Constitutional:      General: She is not in acute distress.    Appearance: She is well-developed. She is morbidly obese. She is not diaphoretic.  Neck:     Thyroid: No thyromegaly.  Cardiovascular:     Rate and Rhythm: Normal rate and regular rhythm.     Pulses: Normal pulses.     Heart sounds: Normal heart sounds.  Pulmonary:     Effort: Pulmonary effort is  normal. No respiratory distress.     Breath sounds: Normal breath sounds.  Abdominal:     General: Bowel sounds are normal. There is no distension.     Palpations: Abdomen is soft.     Tenderness: There is no abdominal tenderness.  Musculoskeletal:        General: Normal range  of motion.     Cervical back: Neck supple.     Right lower leg: No edema.     Left lower leg: No edema.     Comments: S/p left hip replacement Is able to move all extremities   Lymphadenopathy:     Cervical: No cervical adenopathy.  Skin:    General: Skin is warm and dry.  Neurological:     Mental Status: She is alert and oriented to person, place, and time.  Psychiatric:        Mood and Affect: Mood normal.      ASSESSMENT/ PLAN:   TODAY  Primary osteoarthritis left hip: is status post left hip replacement: will continue therapy as directed; will follow up with orthopedics; will continue ultram 50 mg four times daily for 5 days; robaxin 500 mg every 6 hours as needed  2. Gout of big toe: is stable without outbreak; will continue allopurinol 100 mg daily   3. Post operative anemia due to acute blood loss: is stable hgb 8.0 will continue iron three times daily; did receive transfusion in hospital. Will follow up hgb/hct  4. Chronic constipation: is stable will continue colace twice daily and miralax daily   5. Hypertriglyceridemia: will continue lopid 600 mg daily     Ok Edwards NP [facility]  Contact [phone] Monday through Friday 8am- 5pm  After hours call [phone]

## 2021-03-19 ENCOUNTER — Non-Acute Institutional Stay (SKILLED_NURSING_FACILITY): Payer: Medicare Other | Admitting: Internal Medicine

## 2021-03-19 ENCOUNTER — Encounter: Payer: Self-pay | Admitting: Internal Medicine

## 2021-03-19 DIAGNOSIS — D62 Acute posthemorrhagic anemia: Secondary | ICD-10-CM | POA: Diagnosis not present

## 2021-03-19 DIAGNOSIS — Z96642 Presence of left artificial hip joint: Secondary | ICD-10-CM

## 2021-03-19 DIAGNOSIS — E440 Moderate protein-calorie malnutrition: Secondary | ICD-10-CM | POA: Diagnosis not present

## 2021-03-19 DIAGNOSIS — E46 Unspecified protein-calorie malnutrition: Secondary | ICD-10-CM | POA: Insufficient documentation

## 2021-03-19 LAB — TYPE AND SCREEN
ABO/RH(D): O POS
Antibody Screen: NEGATIVE
Unit division: 0
Unit division: 0

## 2021-03-19 LAB — BPAM RBC
Blood Product Expiration Date: 202212292359
Blood Product Expiration Date: 202301032359
Unit Type and Rh: 5100
Unit Type and Rh: 5100

## 2021-03-19 NOTE — Assessment & Plan Note (Addendum)
PT/OT at SNF.  Ortho follow-up with Dr. Aline Brochure 12/15. Here at [facility] reports excessive serous drainage from the wound site without clinical infection.  This finding is in the context of self described excessive sodium intake, total protein of 5.1, and albumin of 3.5. Nutrition consult at SNF.

## 2021-03-19 NOTE — Patient Instructions (Signed)
See assessment and plan under each diagnosis in the problem list and acutely for this visit 

## 2021-03-19 NOTE — Assessment & Plan Note (Signed)
On ferrous sulfate supplementation.  No bleeding dyscrasias reported at the SNF.

## 2021-03-19 NOTE — Progress Notes (Signed)
NURSING HOME LOCATION:  [facility] ROOM NUMBER:  [id number]  CODE STATUS:  Full Code  PCP:  Iona Beard MD  This is a comprehensive admission note to this SNFperformed on this date less than 30 days from date of admission. Included are preadmission medical/surgical history; reconciled medication list; family history; social history and comprehensive review of systems.  Corrections and additions to the records were documented. Comprehensive physical exam was also performed. Additionally a clinical summary was entered for each active diagnosis pertinent to this admission in the Problem List to enhance continuity of care.  HPI: Patient was hospitalized 12/2 - 03/17/2021 for elective left total hip arthroplasty.  During surgery she required Neo-Synephrine to maintain blood pressure and in the PACU after 3-1/2 L of fluid blood pressure was still labile.  She was kept in the ICU on Neo-Synephrine drip until 12/4.  The neo was tapered and HydroDIURIL discontinued with blood pressure stabilization.  Acute blood loss anemia was present with postop hemoglobin of 7.9 for which she received iron supplementation.  H/H on admission had been 9.3/29.8 with normochromic, normocyticc indices.  [place] H/H was 7.9/25.7 on 12/5.  At discharge H/H was 8/25.9.  Ferrous sulfate supplementation ordered.  White count during the hospitalization varied from discharge value of 12,200 up to a high of 15,000.  CKD stage II was stable with a final creatinine 0.62 and GFR greater than 60.  Glucoses while hospitalized ranged from 83 up to 126. PT/OT worked with the patient and recommended discharge to SNF.  Wound Care Nurse at SNF reports that staples are intact but there is excessive serous drainage with no sign of infection clinically.  Patient does describe increased salt intake.  On 12/2 total protein was 5.1 and albumin 2.5 suggesting moderate protein/caloric malnutrition despite weight excess.  Past medical and  surgical history: Includes GERD, eczema, dyslipidemia, and hypertension. Surgeries and procedures include anal fissurectomy, carpal tunnel release,  colonoscopy, right hip surgery, and vesicovaginal fistula closure with TAH.  Social history: Nondrinker; 26-pack-year history of smoking.  She has worked most of her life in [employer].  For the last 15 years that has been part-time.  Family history: Reviewed.   Review of systems: She is a very intelligent individual and provides an excellent history.  She states that she has had the musculoskeletal pain for "a couple of years".  She went on to say that it "stopped me in my tracks on September 22 of this year".  Basically she stated that she was immobile ,going only from bed to bathroom and had decreased nutritional intake as she lives alone. Because of the intractable pain and limitation of activities of daily living surgery was scheduled for 12/2.  It was deferred so that she could lose weight as "my BMI was not intact". At this time her major concern is that she keeps "gathering fluid at the incision".  She does state that she is a "saltaholic".   Other symptoms include recent itching and right throat area.  She has a history of urticaria with an ACE inhibitor.  She describes "little indigestion" despite decreased portions. She states that she has been told by a Dermatologist that she had eczema and psoriasis.  She is under the impression that this was exacerbated by the COVID vaccines. She has minor intermittent numbness and tingling in her feet.  When asked about depression she stated that she had "pitty thoughts" because of her condition.  Constitutional: No fever, significant weight change Eyes:  No redness, discharge, pain, vision change ENT/mouth: No nasal congestion, purulent discharge, earache, change in hearing, sore throat  Cardiovascular: No chest pain, palpitations, paroxysmal nocturnal dyspnea, claudication  Respiratory: No cough, sputum  production, hemoptysis, DOE, significant snoring, apnea  Gastrointestinal: No  dysphagia, abdominal pain, nausea /vomiting, rectal bleeding, melena, change in bowels Genitourinary: No dysuria, hematuria, pyuria, incontinence, nocturia Neurologic: No dizziness, headache, syncope, seizures Psychiatric: No significant insomnia, anorexia Endocrine: No change in hair/nails, excessive thirst, excessive hunger, excessive urination  Hematologic/lymphatic: No significant bruising, lymphadenopathy, abnormal bleeding Allergy/immunology: No itchy/watery eyes, significant sneezing, urticaria, angioedema  Physical exam:  Pertinent or positive findings: morbid obesity present.  Dentition is intact and well maintained.  Heart sounds are distant.  She has a grade 1/2 systolic murmur.  Breath sounds are decreased.  Abdomen is protuberant.  She has trace edema at the sock line.  Dorsalis pedis pulses are stronger than posterior tibial pulses.  Effusion of the right knee is suggested.  She has scattered bland hyperpigmentation over the left lower extremity.    General appearance: Adequately nourished; no acute distress, increased work of breathing is present.   Lymphatic: No lymphadenopathy about the head, neck, axilla. Eyes: No conjunctival inflammation or lid edema is present. There is no scleral icterus. Ears:  External ear exam shows no significant lesions or deformities.   Nose:  External nasal examination shows no deformity or inflammation. Nasal mucosa are pink and moist without lesions, exudates Oral exam: Lips and gums are healthy appearing.There is no oropharyngeal erythema or exudate. Neck:  No thyromegaly, masses, tenderness noted.    Heart:  Normal rate and regular rhythm. S1 and S2 normal without gallop, click, rub.  Lungs:  without wheezes, rhonchi, rales, rubs. Abdomen: Bowel sounds are normal.  Abdomen is soft and nontender with no organomegaly, hernias, masses. GU: Deferred  Extremities:  No  cyanosis, clubbing. Neurologic exam:  Strength equal  in upper & lower extremities. Balance, Rhomberg, finger to nose testing could not be completed due to clinical state Deep tendon reflexes are equal Skin: Warm & dry w/o tenting. No significant rash despite history.  See clinical summary under each active problem in the Problem List with associated updated therapeutic plan

## 2021-03-19 NOTE — Assessment & Plan Note (Signed)
03/13/2021 total protein 5.1 and albumin 2.5.  Wound care nurse at SNF reports excessive serous drainage from the [place] Op site.

## 2021-03-23 DIAGNOSIS — K5909 Other constipation: Secondary | ICD-10-CM | POA: Insufficient documentation

## 2021-03-23 DIAGNOSIS — E781 Pure hyperglyceridemia: Secondary | ICD-10-CM | POA: Insufficient documentation

## 2021-03-23 DIAGNOSIS — E785 Hyperlipidemia, unspecified: Secondary | ICD-10-CM | POA: Insufficient documentation

## 2021-03-26 ENCOUNTER — Encounter: Payer: Self-pay | Admitting: Orthopedic Surgery

## 2021-03-26 ENCOUNTER — Other Ambulatory Visit (HOSPITAL_COMMUNITY)
Admission: RE | Admit: 2021-03-26 | Discharge: 2021-03-26 | Disposition: A | Discharge status: Home / Self Care | Payer: Medicare Other | Source: Skilled Nursing Facility | Attending: Adult Health | Admitting: Adult Health

## 2021-03-26 ENCOUNTER — Ambulatory Visit (INDEPENDENT_AMBULATORY_CARE_PROVIDER_SITE_OTHER): Payer: Medicare Other | Admitting: Orthopedic Surgery

## 2021-03-26 DIAGNOSIS — Z96642 Presence of left artificial hip joint: Secondary | ICD-10-CM

## 2021-03-26 DIAGNOSIS — I10 Essential (primary) hypertension: Secondary | ICD-10-CM | POA: Insufficient documentation

## 2021-03-26 LAB — HEMOGLOBIN AND HEMATOCRIT, BLOOD
HCT: 30 % — ABNORMAL LOW (ref 36.0–46.0)
Hemoglobin: 9 g/dL — ABNORMAL LOW (ref 12.0–15.0)

## 2021-03-26 LAB — MAGNESIUM: Magnesium: 2.1 mg/dL (ref 1.7–2.4)

## 2021-03-26 NOTE — Progress Notes (Signed)
Chief Complaint  Patient presents with   Post-op Follow-up    Left hip 03/13/21 THR / has another week in rehab center     Postop visit status post left total hip with autogenous acetabular bone graft  The patient is postop day 13 this is postop visit #1 she is currently residing at a local nursing home  She still having weakness in terms of hip flexion  Her incision looks good we took the staples out  Her limb alignment looks good she is having some fluid retention and would like her HydroDIURIL restarted, we stopped it when she was having low blood pressures after surgery but it is okay to resume  Recommend continued physical therapy.  She can be discharged from rehab when appropriate  Follow-up in 3 to 4 weeks

## 2021-03-31 ENCOUNTER — Encounter: Payer: Self-pay | Admitting: Adult Health

## 2021-03-31 ENCOUNTER — Ambulatory Visit (HOSPITAL_COMMUNITY): Payer: Medicare Other | Attending: Orthopedic Surgery | Admitting: Physical Therapy

## 2021-03-31 ENCOUNTER — Other Ambulatory Visit: Payer: Self-pay | Admitting: Adult Health

## 2021-03-31 ENCOUNTER — Non-Acute Institutional Stay (SKILLED_NURSING_FACILITY): Payer: Medicare Other | Admitting: Adult Health

## 2021-03-31 DIAGNOSIS — D62 Acute posthemorrhagic anemia: Secondary | ICD-10-CM

## 2021-03-31 DIAGNOSIS — M109 Gout, unspecified: Secondary | ICD-10-CM | POA: Diagnosis not present

## 2021-03-31 DIAGNOSIS — M1612 Unilateral primary osteoarthritis, left hip: Secondary | ICD-10-CM | POA: Diagnosis not present

## 2021-03-31 MED ORDER — ALLOPURINOL 100 MG PO TABS: 100.0000 mg | ORAL_TABLET | Freq: Every day | ORAL | 0 refills | Status: AC

## 2021-03-31 MED ORDER — GEMFIBROZIL 600 MG PO TABS: 600.0000 mg | ORAL_TABLET | Freq: Every day | ORAL | 0 refills | Status: AC

## 2021-03-31 MED ORDER — COLCHICINE 0.6 MG PO TABS: 0.6000 mg | ORAL_TABLET | Freq: Two times a day (BID) | ORAL | 0 refills | Status: DC

## 2021-03-31 MED ORDER — HYDROCHLOROTHIAZIDE 25 MG PO TABS: 25.0000 mg | ORAL_TABLET | Freq: Every day | ORAL | 0 refills | Status: DC

## 2021-03-31 MED ORDER — BETAMETHASONE DIPROPIONATE 0.05 % EX CREA: 1.0000 "application " | TOPICAL_CREAM | Freq: Two times a day (BID) | CUTANEOUS | 0 refills | Status: DC | PRN

## 2021-03-31 MED ORDER — METHOCARBAMOL 500 MG PO TABS: 500.0000 mg | ORAL_TABLET | Freq: Four times a day (QID) | ORAL | 0 refills | Status: DC | PRN

## 2021-03-31 NOTE — Progress Notes (Signed)
Location:  [place] Room Number: 153-P Place of Service:  SNF (31)   CODE STATUS: Full Code  Allergies  Allergen Reactions   Ace Inhibitors Swelling    Swelling of lips/mouth   Angiotensin Receptor Blockers     She has never taken an ARB but has a history of urticaria with ACE inhibitor's; therefore, ARB's are contraindicated.    Chief Complaint  Patient presents with   Discharge Note    Discharge from [facility]     HPI:  She is being discharged to home with  home health for pt/ot. She will need a bedside commode. She does have gout in her bilateral great toes will be given colchicine. She was admitted to the hospital for a left hip replacement. She was admitted to this facility for short term rehab. She has participated in physical and occupational therapy and is now ready for discharged to home.   Past Medical History:  Diagnosis Date   Acid reflux    Arthritis    Eczema    High blood pressure    High cholesterol     Past Surgical History:  Procedure Laterality Date   ABDOMINAL HYSTERECTOMY     ANAL FISSURECTOMY     BACK SURGERY  Lumbar   CARPAL TUNNEL RELEASE Right    COLONOSCOPY N/A 02/21/2017   Procedure: COLONOSCOPY;  Surgeon: Danie Binder, MD;  Location: [facility] ENDO SUITE;  Service: Endoscopy;  Laterality: N/A;  8:30 am - patient refused to move up at all   [place] Left 03/13/2021   Procedure: [place];  Surgeon: Carole Civil, MD;  Location: [facility] ORS;  Service: Orthopedics;  Laterality: Left;   VESICOVAGINAL FISTULA CLOSURE W/ TAH      Social History   Socioeconomic History   Marital status: Widowed    Spouse name: Not on file   Number of children: Not on file   Years of education: Not on file   Highest education level: Not on file  Occupational History   Not on file  Tobacco Use   Smoking status: Former    Packs/day: 0.50    Years: 52.00    Pack years: 26.00     Types: Cigarettes    Quit date: 06/25/2014    Years since quitting: 6.7   Smokeless tobacco: Never  Vaping Use   Vaping Use: Never used  Substance and Sexual Activity   Alcohol use: No   Drug use: No   Sexual activity: Not on file  Other Topics Concern   Not on file  Social History Narrative   Not on file   Social Determinants of Health   Financial Resource Strain: Not on file  Food Insecurity: Not on file  Transportation Needs: Not on file  Physical Activity: Not on file  Stress: Not on file  Social Connections: Not on file  Intimate Partner Violence: Not on file   Family History  Problem Relation Age of Onset   Cancer Other    Arthritis Other    Asthma Other    Heart disease Father    Cancer Brother       VITAL SIGNS BP 120/66    Pulse 82    Temp (!) 97.4 F (36.3 C)    Resp 20    Ht 5' 2.5" (1.588 m)    Wt 227 lb 9.6 oz (103.2 kg)    SpO2 97%  BMI 40.97 kg/m   Outpatient Encounter Medications as of 03/31/2021  Medication Sig Note   allopurinol (ZYLOPRIM) 100 MG tablet Take 100 mg by mouth daily.    Aspirin-Salicylamide-Caffeine 616-073-71.0 MG PACK Take 1 packet daily as needed by mouth (for pain.). 03/04/2021: BC Powder   Balsam [place]-Castor Oil (VENELEX) OINT Apply 1 application topically as directed. Every Shift; Day, Evening, Night    betamethasone dipropionate 0.05 % cream Apply 1 application topically 2 (two) times daily as needed (eczema).    docusate sodium (COLACE) 100 MG capsule Take 1 capsule (100 mg total) by mouth 2 (two) times daily.    ferrous sulfate 325 (65 FE) MG tablet Take 1 tablet (325 mg total) by mouth 3 (three) times daily with meals.    gemfibrozil (LOPID) 600 MG tablet Take 600 mg by mouth daily.    hydrochlorothiazide (HYDRODIURIL) 25 MG tablet Take 25 mg by mouth daily.    methocarbamol (ROBAXIN) 500 MG tablet Take 1 tablet (500 mg total) by mouth every 6 (six) hours as needed for up to 60 doses for muscle spasms.    polyethylene  glycol (MIRALAX / GLYCOLAX) 17 g packet Take 17 g by mouth daily.    Tetrahyd-Glyc-Hypro-PEG-ZnSulf (VISINE TOTALITY MULTI-SYMPTOM OP) Apply 1 drop 3 (three) times daily as needed to eye (for dry/irritated eyes.).    aspirin EC 81 MG tablet Take 81 mg daily by mouth.    [DISCONTINUED] HYDROcodone-acetaminophen (NORCO) 10-325 MG tablet Take 1 tablet by mouth every 4 (four) hours as needed.    [DISCONTINUED] nystatin cream (MYCOSTATIN) Apply 1 application topically 2 (two) times daily.    [DISCONTINUED] triamcinolone cream (KENALOG) 0.1 % Apply 1 application topically 2 (two) times daily as needed (eczema).    No facility-administered encounter medications on file as of 03/31/2021.     SIGNIFICANT DIAGNOSTIC EXAMS  PREVIOUS   03-13-21: wbc 10.1; hgb 9.3; hct 29.8; mcv 88.2 plt 276 glucose 125; bun 17; creat 0.75; k+ 3.4; na++ 137; ca 8.3; liver normal albumin 2.5 mag 1.5 phos 2.5 03-17-21: wbc 12.2; hgb 8.0; hct 25.9; mcv 87,8 plt 360; glucose 85; bun 22; creat 0.62; k+ 3.8; na++ 140; ca 8.4 GFR>60   NO NEW LABS.   Review of Systems  Constitutional:  Negative for malaise/fatigue.  Respiratory:  Negative for cough and shortness of breath.   Cardiovascular:  Negative for chest pain, palpitations and leg swelling.  Gastrointestinal:  Negative for abdominal pain, constipation and heartburn.  Musculoskeletal:  Positive for joint pain. Negative for back pain and myalgias.       Bilateral great toe pain and swelling   Skin: Negative.   Neurological:  Negative for dizziness.  Psychiatric/Behavioral:  The patient is not nervous/anxious.    Physical Exam Constitutional:      General: She is not in acute distress.    Appearance: She is well-developed. She is morbidly obese. She is not diaphoretic.  Neck:     Thyroid: No thyromegaly.  Cardiovascular:     Rate and Rhythm: Normal rate and regular rhythm.     Pulses: Normal pulses.     Heart sounds: Normal heart sounds.  Pulmonary:     Effort:  Pulmonary effort is normal. No respiratory distress.     Breath sounds: Normal breath sounds.  Abdominal:     General: Bowel sounds are normal. There is no distension.     Palpations: Abdomen is soft.     Tenderness: There is no abdominal tenderness.  Musculoskeletal:  Cervical back: Neck supple.     Right lower leg: No edema.     Left lower leg: No edema.     Comments: Is status post left hip replacement Bilateral toes are painful to touch red warm and swollen   Lymphadenopathy:     Cervical: No cervical adenopathy.  Skin:    General: Skin is warm and dry.  Neurological:     Mental Status: She is alert and oriented to person, place, and time.  Psychiatric:        Mood and Affect: Mood normal.     ASSESSMENT/ PLAN:  Discharge orders:   Home health for pt/ot to evaluate and treat as indicated for gait balance strength adl training  2. DME: bed side commode  3. A 30 day supply of her prescription medications have been sent to her pharmacy with a week supply of her colchicine. To [employer]   Time spent with patient: 35 minutes: therapy needs; medications and home health.    Ok Edwards NP [facility]  Contact [phone] Monday through Friday 8am- 5pm  After hours call [phone]

## 2021-04-03 DIAGNOSIS — M1612 Unilateral primary osteoarthritis, left hip: Secondary | ICD-10-CM | POA: Diagnosis not present

## 2021-04-03 DIAGNOSIS — M170 Bilateral primary osteoarthritis of knee: Secondary | ICD-10-CM | POA: Diagnosis not present

## 2021-04-03 DIAGNOSIS — Z96642 Presence of left artificial hip joint: Secondary | ICD-10-CM | POA: Diagnosis not present

## 2021-04-11 DIAGNOSIS — Z96642 Presence of left artificial hip joint: Secondary | ICD-10-CM | POA: Diagnosis not present

## 2021-04-11 DIAGNOSIS — Z471 Aftercare following joint replacement surgery: Secondary | ICD-10-CM | POA: Diagnosis not present

## 2021-04-11 DIAGNOSIS — Z981 Arthrodesis status: Secondary | ICD-10-CM | POA: Diagnosis not present

## 2021-04-11 DIAGNOSIS — E7800 Pure hypercholesterolemia, unspecified: Secondary | ICD-10-CM | POA: Diagnosis not present

## 2021-04-11 DIAGNOSIS — Z7982 Long term (current) use of aspirin: Secondary | ICD-10-CM | POA: Diagnosis not present

## 2021-04-11 DIAGNOSIS — I10 Essential (primary) hypertension: Secondary | ICD-10-CM | POA: Diagnosis not present

## 2021-04-11 DIAGNOSIS — K219 Gastro-esophageal reflux disease without esophagitis: Secondary | ICD-10-CM | POA: Diagnosis not present

## 2021-04-11 DIAGNOSIS — Z87891 Personal history of nicotine dependence: Secondary | ICD-10-CM | POA: Diagnosis not present

## 2021-04-14 ENCOUNTER — Telehealth: Payer: Self-pay

## 2021-04-14 NOTE — Telephone Encounter (Signed)
Called with verbal order

## 2021-04-14 NOTE — Telephone Encounter (Signed)
Jennifer Bennett  with Physical Therapy left message needing a verbal order: 1 time for 1 week  and then 3 times a week for 3 weeks. Please call Jennifer Bennett at [phone]

## 2021-04-15 DIAGNOSIS — Z981 Arthrodesis status: Secondary | ICD-10-CM | POA: Diagnosis not present

## 2021-04-15 DIAGNOSIS — I10 Essential (primary) hypertension: Secondary | ICD-10-CM | POA: Diagnosis not present

## 2021-04-15 DIAGNOSIS — K219 Gastro-esophageal reflux disease without esophagitis: Secondary | ICD-10-CM | POA: Diagnosis not present

## 2021-04-15 DIAGNOSIS — E7800 Pure hypercholesterolemia, unspecified: Secondary | ICD-10-CM | POA: Diagnosis not present

## 2021-04-15 DIAGNOSIS — Z87891 Personal history of nicotine dependence: Secondary | ICD-10-CM | POA: Diagnosis not present

## 2021-04-15 DIAGNOSIS — Z7982 Long term (current) use of aspirin: Secondary | ICD-10-CM | POA: Diagnosis not present

## 2021-04-15 DIAGNOSIS — Z471 Aftercare following joint replacement surgery: Secondary | ICD-10-CM | POA: Diagnosis not present

## 2021-04-15 DIAGNOSIS — Z96642 Presence of left artificial hip joint: Secondary | ICD-10-CM | POA: Diagnosis not present

## 2021-04-16 DIAGNOSIS — Z471 Aftercare following joint replacement surgery: Secondary | ICD-10-CM | POA: Diagnosis not present

## 2021-04-16 DIAGNOSIS — Z981 Arthrodesis status: Secondary | ICD-10-CM | POA: Diagnosis not present

## 2021-04-16 DIAGNOSIS — E7800 Pure hypercholesterolemia, unspecified: Secondary | ICD-10-CM | POA: Diagnosis not present

## 2021-04-16 DIAGNOSIS — K219 Gastro-esophageal reflux disease without esophagitis: Secondary | ICD-10-CM | POA: Diagnosis not present

## 2021-04-16 DIAGNOSIS — I10 Essential (primary) hypertension: Secondary | ICD-10-CM | POA: Diagnosis not present

## 2021-04-16 DIAGNOSIS — Z96642 Presence of left artificial hip joint: Secondary | ICD-10-CM | POA: Diagnosis not present

## 2021-04-16 DIAGNOSIS — Z87891 Personal history of nicotine dependence: Secondary | ICD-10-CM | POA: Diagnosis not present

## 2021-04-16 DIAGNOSIS — Z7982 Long term (current) use of aspirin: Secondary | ICD-10-CM | POA: Diagnosis not present

## 2021-04-17 DIAGNOSIS — I10 Essential (primary) hypertension: Secondary | ICD-10-CM | POA: Diagnosis not present

## 2021-04-17 DIAGNOSIS — Z7982 Long term (current) use of aspirin: Secondary | ICD-10-CM | POA: Diagnosis not present

## 2021-04-17 DIAGNOSIS — Z471 Aftercare following joint replacement surgery: Secondary | ICD-10-CM | POA: Diagnosis not present

## 2021-04-17 DIAGNOSIS — Z96642 Presence of left artificial hip joint: Secondary | ICD-10-CM | POA: Diagnosis not present

## 2021-04-17 DIAGNOSIS — Z87891 Personal history of nicotine dependence: Secondary | ICD-10-CM | POA: Diagnosis not present

## 2021-04-17 DIAGNOSIS — E7800 Pure hypercholesterolemia, unspecified: Secondary | ICD-10-CM | POA: Diagnosis not present

## 2021-04-17 DIAGNOSIS — Z981 Arthrodesis status: Secondary | ICD-10-CM | POA: Diagnosis not present

## 2021-04-17 DIAGNOSIS — K219 Gastro-esophageal reflux disease without esophagitis: Secondary | ICD-10-CM | POA: Diagnosis not present

## 2021-04-20 DIAGNOSIS — Z7982 Long term (current) use of aspirin: Secondary | ICD-10-CM | POA: Diagnosis not present

## 2021-04-20 DIAGNOSIS — Z96642 Presence of left artificial hip joint: Secondary | ICD-10-CM | POA: Diagnosis not present

## 2021-04-20 DIAGNOSIS — Z87891 Personal history of nicotine dependence: Secondary | ICD-10-CM | POA: Diagnosis not present

## 2021-04-20 DIAGNOSIS — Z981 Arthrodesis status: Secondary | ICD-10-CM | POA: Diagnosis not present

## 2021-04-20 DIAGNOSIS — I10 Essential (primary) hypertension: Secondary | ICD-10-CM | POA: Diagnosis not present

## 2021-04-20 DIAGNOSIS — K219 Gastro-esophageal reflux disease without esophagitis: Secondary | ICD-10-CM | POA: Diagnosis not present

## 2021-04-20 DIAGNOSIS — Z471 Aftercare following joint replacement surgery: Secondary | ICD-10-CM | POA: Diagnosis not present

## 2021-04-20 DIAGNOSIS — E7800 Pure hypercholesterolemia, unspecified: Secondary | ICD-10-CM | POA: Diagnosis not present

## 2021-04-22 DIAGNOSIS — Z7982 Long term (current) use of aspirin: Secondary | ICD-10-CM | POA: Diagnosis not present

## 2021-04-22 DIAGNOSIS — I10 Essential (primary) hypertension: Secondary | ICD-10-CM | POA: Diagnosis not present

## 2021-04-22 DIAGNOSIS — E7800 Pure hypercholesterolemia, unspecified: Secondary | ICD-10-CM | POA: Diagnosis not present

## 2021-04-22 DIAGNOSIS — K219 Gastro-esophageal reflux disease without esophagitis: Secondary | ICD-10-CM | POA: Diagnosis not present

## 2021-04-22 DIAGNOSIS — Z471 Aftercare following joint replacement surgery: Secondary | ICD-10-CM | POA: Diagnosis not present

## 2021-04-22 DIAGNOSIS — Z981 Arthrodesis status: Secondary | ICD-10-CM | POA: Diagnosis not present

## 2021-04-22 DIAGNOSIS — Z87891 Personal history of nicotine dependence: Secondary | ICD-10-CM | POA: Diagnosis not present

## 2021-04-22 DIAGNOSIS — Z96642 Presence of left artificial hip joint: Secondary | ICD-10-CM | POA: Diagnosis not present

## 2021-04-23 ENCOUNTER — Encounter: Payer: Self-pay | Admitting: Orthopedic Surgery

## 2021-04-23 ENCOUNTER — Other Ambulatory Visit: Payer: Self-pay

## 2021-04-23 ENCOUNTER — Ambulatory Visit (INDEPENDENT_AMBULATORY_CARE_PROVIDER_SITE_OTHER): Payer: Medicare Other | Admitting: Orthopedic Surgery

## 2021-04-23 DIAGNOSIS — Z96642 Presence of left artificial hip joint: Secondary | ICD-10-CM

## 2021-04-23 NOTE — Progress Notes (Signed)
POST OP   Chief Complaint  Patient presents with   Post-op Follow-up    Left hip replacement 03/13/21 improving     Encounter Diagnosis  Name Primary?   S/P total left hip arthroplasty 03/13/21 Yes    PROCEDURE: Right total hip with bone graft  POV # 2  Postop day #41  Meds related: none  She is doing well she is walking now with a cane she uses a walker a little bit but try the cane today and did well  She rotates the hip well when I flex it there is no pain her leg lengths appear to be equal  X-ray next visit 4 weeks

## 2021-04-24 ENCOUNTER — Other Ambulatory Visit: Payer: Self-pay | Admitting: Orthopedic Surgery

## 2021-04-24 DIAGNOSIS — I10 Essential (primary) hypertension: Secondary | ICD-10-CM | POA: Diagnosis not present

## 2021-04-24 DIAGNOSIS — Z471 Aftercare following joint replacement surgery: Secondary | ICD-10-CM | POA: Diagnosis not present

## 2021-04-24 DIAGNOSIS — E7800 Pure hypercholesterolemia, unspecified: Secondary | ICD-10-CM | POA: Diagnosis not present

## 2021-04-24 DIAGNOSIS — Z981 Arthrodesis status: Secondary | ICD-10-CM | POA: Diagnosis not present

## 2021-04-24 DIAGNOSIS — Z7982 Long term (current) use of aspirin: Secondary | ICD-10-CM | POA: Diagnosis not present

## 2021-04-24 DIAGNOSIS — Z96642 Presence of left artificial hip joint: Secondary | ICD-10-CM | POA: Diagnosis not present

## 2021-04-24 DIAGNOSIS — Z87891 Personal history of nicotine dependence: Secondary | ICD-10-CM | POA: Diagnosis not present

## 2021-04-24 DIAGNOSIS — K219 Gastro-esophageal reflux disease without esophagitis: Secondary | ICD-10-CM | POA: Diagnosis not present

## 2021-04-24 NOTE — Telephone Encounter (Signed)
Patient called back - Jennifer Bennett it is the stool softener that she would like, or if there is an over the counter option. [facility] is the pharmacy.

## 2021-04-28 DIAGNOSIS — Z96642 Presence of left artificial hip joint: Secondary | ICD-10-CM | POA: Diagnosis not present

## 2021-04-28 DIAGNOSIS — I10 Essential (primary) hypertension: Secondary | ICD-10-CM | POA: Diagnosis not present

## 2021-04-28 DIAGNOSIS — Z471 Aftercare following joint replacement surgery: Secondary | ICD-10-CM | POA: Diagnosis not present

## 2021-04-28 DIAGNOSIS — E7800 Pure hypercholesterolemia, unspecified: Secondary | ICD-10-CM | POA: Diagnosis not present

## 2021-04-28 DIAGNOSIS — Z7982 Long term (current) use of aspirin: Secondary | ICD-10-CM | POA: Diagnosis not present

## 2021-04-28 DIAGNOSIS — Z981 Arthrodesis status: Secondary | ICD-10-CM | POA: Diagnosis not present

## 2021-04-28 DIAGNOSIS — K219 Gastro-esophageal reflux disease without esophagitis: Secondary | ICD-10-CM | POA: Diagnosis not present

## 2021-04-28 DIAGNOSIS — Z87891 Personal history of nicotine dependence: Secondary | ICD-10-CM | POA: Diagnosis not present

## 2021-04-28 MED ORDER — DOCUSATE SODIUM 100 MG PO CAPS
100.0000 mg | ORAL_CAPSULE | Freq: Two times a day (BID) | ORAL | 0 refills | Status: DC
Start: 2021-04-28 — End: 2022-07-21

## 2021-04-29 DIAGNOSIS — Z96642 Presence of left artificial hip joint: Secondary | ICD-10-CM | POA: Diagnosis not present

## 2021-04-29 DIAGNOSIS — K219 Gastro-esophageal reflux disease without esophagitis: Secondary | ICD-10-CM | POA: Diagnosis not present

## 2021-04-29 DIAGNOSIS — Z471 Aftercare following joint replacement surgery: Secondary | ICD-10-CM | POA: Diagnosis not present

## 2021-04-29 DIAGNOSIS — Z7982 Long term (current) use of aspirin: Secondary | ICD-10-CM | POA: Diagnosis not present

## 2021-04-29 DIAGNOSIS — Z981 Arthrodesis status: Secondary | ICD-10-CM | POA: Diagnosis not present

## 2021-04-29 DIAGNOSIS — Z87891 Personal history of nicotine dependence: Secondary | ICD-10-CM | POA: Diagnosis not present

## 2021-04-29 DIAGNOSIS — I10 Essential (primary) hypertension: Secondary | ICD-10-CM | POA: Diagnosis not present

## 2021-04-29 DIAGNOSIS — E7800 Pure hypercholesterolemia, unspecified: Secondary | ICD-10-CM | POA: Diagnosis not present

## 2021-05-01 DIAGNOSIS — Z87891 Personal history of nicotine dependence: Secondary | ICD-10-CM | POA: Diagnosis not present

## 2021-05-01 DIAGNOSIS — Z7982 Long term (current) use of aspirin: Secondary | ICD-10-CM | POA: Diagnosis not present

## 2021-05-01 DIAGNOSIS — E7800 Pure hypercholesterolemia, unspecified: Secondary | ICD-10-CM | POA: Diagnosis not present

## 2021-05-01 DIAGNOSIS — K219 Gastro-esophageal reflux disease without esophagitis: Secondary | ICD-10-CM | POA: Diagnosis not present

## 2021-05-01 DIAGNOSIS — Z96642 Presence of left artificial hip joint: Secondary | ICD-10-CM | POA: Diagnosis not present

## 2021-05-01 DIAGNOSIS — Z981 Arthrodesis status: Secondary | ICD-10-CM | POA: Diagnosis not present

## 2021-05-01 DIAGNOSIS — I10 Essential (primary) hypertension: Secondary | ICD-10-CM | POA: Diagnosis not present

## 2021-05-01 DIAGNOSIS — Z471 Aftercare following joint replacement surgery: Secondary | ICD-10-CM | POA: Diagnosis not present

## 2021-05-21 ENCOUNTER — Other Ambulatory Visit: Payer: Self-pay

## 2021-05-21 ENCOUNTER — Ambulatory Visit (INDEPENDENT_AMBULATORY_CARE_PROVIDER_SITE_OTHER): Payer: Medicare Other | Admitting: Orthopedic Surgery

## 2021-05-21 ENCOUNTER — Encounter: Payer: Self-pay | Admitting: Orthopedic Surgery

## 2021-05-21 ENCOUNTER — Ambulatory Visit: Payer: Medicare Other

## 2021-05-21 DIAGNOSIS — Z96642 Presence of left artificial hip joint: Secondary | ICD-10-CM | POA: Diagnosis not present

## 2021-05-21 NOTE — Progress Notes (Signed)
Postop follow-up at 7 weeks status post left total hip replacement with bone grafting autogenous  The patient is doing well she is walking with her cane she has no pain in her left hip.  She still has somewhat of a limp  She says she is able to do some things around the house but not as much as she wants  We took an x-ray today and compared it to the postop film however the 2 x-rays are not of the same rotational alignment  We will use today's film as the baseline film  The acetabular cup seems to be in good position with 2 screws the femoral prosthesis is a press-fit stem with no signs of loosening  Recommend follow-up in 5 weeks patient will continue with progressive rehab

## 2021-06-15 DIAGNOSIS — M150 Primary generalized (osteo)arthritis: Secondary | ICD-10-CM | POA: Diagnosis not present

## 2021-06-15 DIAGNOSIS — D649 Anemia, unspecified: Secondary | ICD-10-CM | POA: Diagnosis not present

## 2021-06-15 DIAGNOSIS — I10 Essential (primary) hypertension: Secondary | ICD-10-CM | POA: Diagnosis not present

## 2021-06-25 ENCOUNTER — Telehealth: Payer: Self-pay | Admitting: Orthopedic Surgery

## 2021-06-25 ENCOUNTER — Ambulatory Visit (INDEPENDENT_AMBULATORY_CARE_PROVIDER_SITE_OTHER): Payer: Medicare Other | Admitting: Orthopedic Surgery

## 2021-06-25 ENCOUNTER — Other Ambulatory Visit: Payer: Self-pay

## 2021-06-25 DIAGNOSIS — Z96642 Presence of left artificial hip joint: Secondary | ICD-10-CM

## 2021-06-25 NOTE — Telephone Encounter (Signed)
Called patient. No answer. Left generic message letting her know that she does not have to wait to go back to work.  ?

## 2021-06-25 NOTE — Telephone Encounter (Signed)
Patient forgot to ask does she have to wait till her next visit before she can go back to work, if she want's to.  ? ?Please call her back and advise her.  ? ?Thanks,  ?

## 2021-06-26 NOTE — Progress Notes (Signed)
?  Chief Complaint  ?Patient presents with  ? Post-op Follow-up  ?  Left total hip arthroplasty 03/13/2021  ? ? ?Ms. Speaker continues to improve regarding her left total hip she still ambulatory with a cane increasing mobility daily ? ?Her range of motion in her hip is excellent ? ?Her gait is improving ? ?Recommend 73-monthfollow-up ?

## 2021-06-30 NOTE — Telephone Encounter (Signed)
Spoke with patient. She is aware she does not have to wait until next appt in June with provider to go back to work.  ?

## 2021-07-28 DIAGNOSIS — E785 Hyperlipidemia, unspecified: Secondary | ICD-10-CM | POA: Diagnosis not present

## 2021-07-28 DIAGNOSIS — D649 Anemia, unspecified: Secondary | ICD-10-CM | POA: Diagnosis not present

## 2021-07-28 DIAGNOSIS — M150 Primary generalized (osteo)arthritis: Secondary | ICD-10-CM | POA: Diagnosis not present

## 2021-07-28 DIAGNOSIS — I10 Essential (primary) hypertension: Secondary | ICD-10-CM | POA: Diagnosis not present

## 2021-09-24 ENCOUNTER — Ambulatory Visit (INDEPENDENT_AMBULATORY_CARE_PROVIDER_SITE_OTHER): Payer: Medicare Other

## 2021-09-24 ENCOUNTER — Ambulatory Visit: Payer: Medicare Other | Admitting: Orthopedic Surgery

## 2021-09-24 DIAGNOSIS — M1612 Unilateral primary osteoarthritis, left hip: Secondary | ICD-10-CM | POA: Diagnosis not present

## 2021-09-24 DIAGNOSIS — Z96642 Presence of left artificial hip joint: Secondary | ICD-10-CM

## 2021-09-24 DIAGNOSIS — Z96641 Presence of right artificial hip joint: Secondary | ICD-10-CM | POA: Diagnosis not present

## 2021-09-24 MED ORDER — MELOXICAM 7.5 MG PO TABS: 7.5000 mg | ORAL_TABLET | Freq: Every day | ORAL | 5 refills | Status: DC

## 2021-09-24 NOTE — Patient Instructions (Signed)
Start meloxicam for arthritis  Meds ordered this encounter  Medications   meloxicam (MOBIC) 7.5 MG tablet    Sig: Take 1 tablet (7.5 mg total) by mouth daily.    Dispense:  30 tablet    Refill:  5

## 2021-09-24 NOTE — Progress Notes (Signed)
read

## 2021-09-24 NOTE — Progress Notes (Signed)
Chief Complaint  Patient presents with   Routine Post Op    S/P L THA 03/13/21   Medication Refill    Would like to have something to help for arthritis   76 year old female status post left total hip in 2022 in December.  This was noted to require bone grafting of the acetabulum  She does not have any difficulty with the hip at this time but says she is having aches in multiple joints including both shoulders both knees  She would like something for arthritis.  She has tried Tylenol and it has not produced the desired effects  Imaging shows abnormal positioning of the acetabulum cup as well as the femoral stem or 2 screws in the cup and the implant looks stable  She also has a right total hip which was done many years ago which is also stable  She is also worried that her leg lengths are equal.  On imaging she has a 5 mm difference from the right to the left hip but has a moderate-sized thoracolumbar scoliosis accounting for her leg length discrepancy  She can start on meloxicam 1 a day  She will follow-up in 6 months for annual films of her left hip and right hip   Meds ordered this encounter  Medications   meloxicam (MOBIC) 7.5 MG tablet    Sig: Take 1 tablet (7.5 mg total) by mouth daily.    Dispense:  30 tablet    Refill:  5

## 2021-09-28 ENCOUNTER — Ambulatory Visit: Payer: Medicare Other | Admitting: Orthopedic Surgery

## 2021-10-14 ENCOUNTER — Emergency Department (HOSPITAL_COMMUNITY): Payer: Medicare Other

## 2021-10-14 ENCOUNTER — Emergency Department (HOSPITAL_COMMUNITY)
Admission: EM | Admit: 2021-10-14 | Discharge: 2021-10-14 | Disposition: A | Discharge status: Home / Self Care | Payer: Medicare Other | Attending: Emergency Medicine | Admitting: Emergency Medicine

## 2021-10-14 ENCOUNTER — Encounter (HOSPITAL_COMMUNITY): Payer: Self-pay

## 2021-10-14 ENCOUNTER — Other Ambulatory Visit: Payer: Self-pay

## 2021-10-14 DIAGNOSIS — M7989 Other specified soft tissue disorders: Secondary | ICD-10-CM | POA: Diagnosis not present

## 2021-10-14 DIAGNOSIS — R0602 Shortness of breath: Secondary | ICD-10-CM | POA: Diagnosis not present

## 2021-10-14 DIAGNOSIS — Z79899 Other long term (current) drug therapy: Secondary | ICD-10-CM | POA: Insufficient documentation

## 2021-10-14 DIAGNOSIS — Z7982 Long term (current) use of aspirin: Secondary | ICD-10-CM | POA: Insufficient documentation

## 2021-10-14 DIAGNOSIS — I10 Essential (primary) hypertension: Secondary | ICD-10-CM | POA: Insufficient documentation

## 2021-10-14 DIAGNOSIS — Z981 Arthrodesis status: Secondary | ICD-10-CM | POA: Diagnosis not present

## 2021-10-14 DIAGNOSIS — M79604 Pain in right leg: Secondary | ICD-10-CM | POA: Diagnosis not present

## 2021-10-14 DIAGNOSIS — M25551 Pain in right hip: Secondary | ICD-10-CM | POA: Diagnosis not present

## 2021-10-14 MED ORDER — OXYCODONE-ACETAMINOPHEN 5-325 MG PO TABS
1.0000 | ORAL_TABLET | Freq: Four times a day (QID) | ORAL | 0 refills | Status: DC | PRN
Start: 2021-10-14 — End: 2023-04-11

## 2021-10-14 NOTE — ED Triage Notes (Signed)
Patient with complaints of right upper leg pain that started the previous day. Denies injury.

## 2021-10-14 NOTE — ED Notes (Signed)
US at bedside

## 2021-10-14 NOTE — ED Provider Notes (Signed)
[facility] EMERGENCY DEPARTMENT Provider Note   CSN: [id number] Arrival date & time: 10/14/21  1047     History  Chief Complaint  Patient presents with   Leg Pain    Jennifer Bennett is a 76 y.o. female.  Patient complains of right thigh pain, patient has a history of hypertension  The history is provided by the patient and medical records. No language interpreter was used.  Leg Pain Location:  Leg Leg location:  R leg Pain details:    Quality:  Aching and dull   Radiates to:  Does not radiate   Severity:  Moderate   Timing:  Constant   Progression:  Worsening Chronicity:  New Dislocation: no   Associated symptoms: no back pain and no fatigue        Home Medications Prior to Admission medications   Medication Sig Start Date End Date Taking? Authorizing Provider  allopurinol (ZYLOPRIM) 100 MG tablet Take 1 tablet (100 mg total) by mouth daily. 03/31/21  Yes Gerlene Fee, NP  aspirin EC 81 MG tablet Take 81 mg daily by mouth.   Yes [provider]  gemfibrozil (LOPID) 600 MG tablet Take 1 tablet (600 mg total) by mouth daily. 03/31/21  Yes Gerlene Fee, NP  hydrochlorothiazide (HYDRODIURIL) 25 MG tablet Take 1 tablet (25 mg total) by mouth daily. 03/31/21  Yes Gerlene Fee, NP  meloxicam (MOBIC) 7.5 MG tablet Take 1 tablet (7.5 mg total) by mouth daily. 09/24/21  Yes Carole Civil, MD  metoprolol tartrate (LOPRESSOR) 50 MG tablet Take 50 mg by mouth daily. 08/21/21  Yes [provider]  oxyCODONE-acetaminophen (PERCOCET/ROXICET) 5-325 MG tablet Take 1 tablet by mouth every 6 (six) hours as needed for severe pain. 10/14/21  Yes Milton Ferguson, MD  Tetrahyd-Glyc-Hypro-PEG-ZnSulf (VISINE TOTALITY MULTI-SYMPTOM OP) Apply 1 drop 3 (three) times daily as needed to eye (for dry/irritated eyes.).   Yes [provider]  betamethasone dipropionate 0.05 % cream Apply 1 application topically 2 (two) times daily as needed  (eczema). Patient not taking: Reported on 10/14/2021 03/31/21   Gerlene Fee, NP  colchicine 0.6 MG tablet Take 1 tablet (0.6 mg total) by mouth 2 (two) times daily for 5 days. 04/02/21 04/07/21  Gerlene Fee, NP  docusate sodium (COLACE) 100 MG capsule Take 1 capsule (100 mg total) by mouth 2 (two) times daily. Patient not taking: Reported on 10/14/2021 04/28/21   Carole Civil, MD  ferrous sulfate 325 (65 FE) MG tablet Take 1 tablet (325 mg total) by mouth 3 (three) times daily with meals. 03/17/21 04/16/21  Carole Civil, MD  methocarbamol (ROBAXIN) 500 MG tablet Take 1 tablet (500 mg total) by mouth every 6 (six) hours as needed for up to 60 doses for muscle spasms. Patient not taking: Reported on 10/14/2021 03/31/21   Gerlene Fee, NP  polyethylene glycol (MIRALAX / GLYCOLAX) 17 g packet Take 17 g by mouth daily. Patient not taking: Reported on 10/14/2021 03/18/21   Carole Civil, MD      Allergies    Ace inhibitors and Angiotensin receptor blockers    Review of Systems   Review of Systems  Constitutional:  Negative for appetite change and fatigue.  HENT:  Negative for congestion, ear discharge and sinus pressure.   Eyes:  Negative for discharge.  Respiratory:  Negative for cough.   Cardiovascular:  Negative for chest pain.  Gastrointestinal:  Negative for abdominal pain and diarrhea.  Genitourinary:  Negative for frequency and hematuria.  Musculoskeletal:  Negative for back pain.       Pain in right thigh  Skin:  Negative for rash.  Neurological:  Negative for seizures and headaches.  Psychiatric/Behavioral:  Negative for hallucinations.     Physical Exam Updated Vital Signs BP (!) 165/80 (BP Location: Right Arm)   Pulse 61   Temp 97.7 F (36.5 C) (Oral)   Resp 20   Ht '5\' 2"'$  (1.575 m)   Wt 104.3 kg   SpO2 97%   BMI 42.07 kg/m  Physical Exam Vitals and nursing note reviewed.  Constitutional:      Appearance: She is well-developed.  HENT:     Head:  Normocephalic.     Mouth/Throat:     Mouth: Mucous membranes are moist.  Eyes:     General: No scleral icterus.    Conjunctiva/sclera: Conjunctivae normal.  Neck:     Thyroid: No thyromegaly.  Cardiovascular:     Rate and Rhythm: Normal rate and regular rhythm.     Heart sounds: No murmur heard.    No friction rub. No gallop.  Pulmonary:     Breath sounds: No stridor. No wheezing or rales.  Chest:     Chest wall: No tenderness.  Abdominal:     General: There is no distension.     Tenderness: There is no abdominal tenderness. There is no rebound.  Musculoskeletal:     Cervical back: Neck supple.     Comments: Tender right thigh  Lymphadenopathy:     Cervical: No cervical adenopathy.  Skin:    Findings: No erythema or rash.  Neurological:     Mental Status: She is alert and oriented to person, place, and time.     Motor: No abnormal muscle tone.     Coordination: Coordination normal.  Psychiatric:        Behavior: Behavior normal.     ED Results / Procedures / Treatments   Labs (all labs ordered are listed, but only abnormal results are displayed) Labs Reviewed - No data to display  EKG None  Radiology DG Hip Unilat W or Wo Pelvis 2-3 Views Right  Result Date: 10/14/2021 CLINICAL DATA:  Right hip and upper leg pain for 1 day. EXAM: DG HIP (WITH OR WITHOUT PELVIS) 2-3V RIGHT COMPARISON:  Pelvis/left hip radiographs 09/24/2021 FINDINGS: Assessment is limited by patient body habitus. Bilateral hip arthroplasties are again noted. No definite acute fracture or dislocation is identified. There is asymmetric heterotopic ossification adjacent to the proximal left femur. Prior lumbar fusion is noted. IMPRESSION: No acute osseous abnormality identified. Electronically Signed   By: Logan Bores M.D.   On: 10/14/2021 15:36   DG Chest Port 1 View  Result Date: 10/14/2021 CLINICAL DATA:  Shortness of breath. EXAM: PORTABLE CHEST 1 VIEW COMPARISON:  10/22/2020 FINDINGS: The cardiac  silhouette, mediastinal and hilar contours are within normal limits given the AP projection and portable technique. No change since prior study. Stable tortuosity of the thoracic aorta. Mild chronic bronchitic type interstitial changes but no infiltrates, edema or effusions. Stable mild eventration of the left hemidiaphragm. No worrisome pulmonary lesions. The bony thorax is intact. Stable advanced bilateral shoulder joint degenerative changes. IMPRESSION: No acute cardiopulmonary findings. Electronically Signed   By: Marijo Sanes M.D.   On: 10/14/2021 13:48   US Venous Img Lower Unilateral Right  Result Date: 10/14/2021 CLINICAL DATA:  76 year old female with right lower extremity swelling. EXAM: RIGHT LOWER EXTREMITY VENOUS DOPPLER ULTRASOUND  TECHNIQUE: Gray-scale sonography with graded compression, as well as color Doppler and duplex ultrasound were performed to evaluate the right lower extremity deep venous systems from the level of the common femoral vein and including the common femoral, femoral, profunda femoral, popliteal and calf veins including the posterior tibial, peroneal and gastrocnemius veins when visible. Spectral Doppler was utilized to evaluate flow at rest and with distal augmentation maneuvers in the common femoral, femoral and popliteal veins. The contralateral common femoral vein was also evaluated for comparison. COMPARISON:  None Available. FINDINGS: RIGHT LOWER EXTREMITY Common Femoral Vein: No evidence of thrombus. Normal compressibility, respiratory phasicity and response to augmentation. Central Greater Saphenous Vein: No evidence of thrombus. Normal compressibility and flow on color Doppler imaging. Central Profunda Femoral Vein: No evidence of thrombus. Normal compressibility and flow on color Doppler imaging. Femoral Vein: No evidence of thrombus. Normal compressibility, respiratory phasicity and response to augmentation. Popliteal Vein: No evidence of thrombus. Normal  compressibility, respiratory phasicity and response to augmentation. Calf Veins: No evidence of thrombus. Normal compressibility and flow on color Doppler imaging. Other Findings:  None. LEFT LOWER EXTREMITY Common Femoral Vein: No evidence of thrombus. Normal compressibility, respiratory phasicity and response to augmentation. IMPRESSION: No evidence of right lower extremity deep venous thrombosis. Ruthann Cancer, MD [facility] Electronically Signed   By: Ruthann Cancer M.D.   On: 10/14/2021 12:18    Procedures Procedures    Medications Ordered in ED Medications - No data to display  ED Course/ Medical Decision Making/ A&P                           Medical Decision Making Amount and/or Complexity of Data Reviewed Radiology: ordered.  Risk Prescription drug management.  This patient presents to the ED for concern of leg pain, this involves an extensive number of treatment options, and is a complaint that carries with it a high risk of complications and morbidity.  The differential diagnosis includes DVT, fracture, contusion   Co morbidities that complicate the patient evaluation  Hypertension   Additional history obtained:  Additional history obtained from friend External records from outside source obtained and reviewed including hospital record   Lab Tests:  No labs ordered  Imaging Studies ordered:  I ordered imaging studies including right hip chest x-ray and ultrasound of right leg I independently visualized and interpreted imaging which showed negative I agree with the radiologist interpretation   Cardiac Monitoring: / EKG:  The patient was maintained on a cardiac monitor.  I personally viewed and interpreted the cardiac monitored which showed an underlying rhythm of: Normal sinus rhythm   Consultations Obtained:  No consultant Problem List / ED Course / Critical interventions / Medication  management  Painful right lower leg and I ordered medication including Percocet for pain Reevaluation of the patient after these medicines showed that the patient stayed the same I have reviewed the patients home medicines and have made adjustments as needed   Social Determinants of Health:  None   Test / Admission - Considered:  MRI of the leg was considered  Patient with swelling and tenderness to lateral right thigh.  Most likely contusion.  Ultrasound showed negative DVT.  Also plain films were negative        Final Clinical Impression(s) / ED Diagnoses Final diagnoses:  Right leg pain    Rx / DC Orders ED Discharge Orders  Ordered    oxyCODONE-acetaminophen (PERCOCET/ROXICET) 5-325 MG tablet  Every 6 hours PRN        10/14/21 1547              Milton Ferguson, MD 10/14/21 1739

## 2021-10-14 NOTE — Discharge Instructions (Signed)
Follow up with your md for recheck next week

## 2021-10-19 DIAGNOSIS — Z0000 Encounter for general adult medical examination without abnormal findings: Secondary | ICD-10-CM | POA: Diagnosis not present

## 2021-10-19 DIAGNOSIS — M79651 Pain in right thigh: Secondary | ICD-10-CM | POA: Diagnosis not present

## 2021-10-19 DIAGNOSIS — I10 Essential (primary) hypertension: Secondary | ICD-10-CM | POA: Diagnosis not present

## 2021-10-19 DIAGNOSIS — M150 Primary generalized (osteo)arthritis: Secondary | ICD-10-CM | POA: Diagnosis not present

## 2021-11-09 DIAGNOSIS — E119 Type 2 diabetes mellitus without complications: Secondary | ICD-10-CM | POA: Diagnosis not present

## 2021-11-09 DIAGNOSIS — I10 Essential (primary) hypertension: Secondary | ICD-10-CM | POA: Diagnosis not present

## 2021-11-09 DIAGNOSIS — E785 Hyperlipidemia, unspecified: Secondary | ICD-10-CM | POA: Diagnosis not present

## 2021-11-17 DIAGNOSIS — E785 Hyperlipidemia, unspecified: Secondary | ICD-10-CM | POA: Diagnosis not present

## 2021-11-17 DIAGNOSIS — R21 Rash and other nonspecific skin eruption: Secondary | ICD-10-CM | POA: Diagnosis not present

## 2021-11-17 DIAGNOSIS — I10 Essential (primary) hypertension: Secondary | ICD-10-CM | POA: Diagnosis not present

## 2021-11-17 DIAGNOSIS — M150 Primary generalized (osteo)arthritis: Secondary | ICD-10-CM | POA: Diagnosis not present

## 2021-12-07 ENCOUNTER — Ambulatory Visit (HOSPITAL_COMMUNITY): Payer: Medicare Other

## 2022-01-12 DIAGNOSIS — M150 Primary generalized (osteo)arthritis: Secondary | ICD-10-CM | POA: Diagnosis not present

## 2022-01-12 DIAGNOSIS — M170 Bilateral primary osteoarthritis of knee: Secondary | ICD-10-CM | POA: Diagnosis not present

## 2022-01-12 DIAGNOSIS — M159 Polyosteoarthritis, unspecified: Secondary | ICD-10-CM | POA: Diagnosis not present

## 2022-01-12 DIAGNOSIS — I10 Essential (primary) hypertension: Secondary | ICD-10-CM | POA: Diagnosis not present

## 2022-01-12 DIAGNOSIS — E785 Hyperlipidemia, unspecified: Secondary | ICD-10-CM | POA: Diagnosis not present

## 2022-01-19 ENCOUNTER — Ambulatory Visit (HOSPITAL_COMMUNITY): Payer: Medicare Other | Attending: Family Medicine | Admitting: Physical Therapy

## 2022-01-19 ENCOUNTER — Encounter (HOSPITAL_COMMUNITY): Payer: Self-pay | Admitting: Physical Therapy

## 2022-01-19 DIAGNOSIS — R262 Difficulty in walking, not elsewhere classified: Secondary | ICD-10-CM | POA: Diagnosis not present

## 2022-01-19 DIAGNOSIS — M25552 Pain in left hip: Secondary | ICD-10-CM | POA: Insufficient documentation

## 2022-01-19 DIAGNOSIS — M25551 Pain in right hip: Secondary | ICD-10-CM | POA: Diagnosis not present

## 2022-01-19 DIAGNOSIS — R2689 Other abnormalities of gait and mobility: Secondary | ICD-10-CM | POA: Diagnosis not present

## 2022-01-19 NOTE — Therapy (Signed)
OUTPATIENT PHYSICAL THERAPY LOWER EXTREMITY EVALUATION   Patient Name: Jennifer Bennett MRN: [id number] DOB:Nov 23, 1945, 76 y.o., female Today's Date: 01/19/2022   PT End of Session - 01/19/22 1107     Visit Number 1    Number of Visits 12    Date for PT Re-Evaluation 03/02/22    Authorization Type [employer] Medicare    Authorization Time Period No VL, No Auth    Progress Note Due on Visit 10    PT Start Time 1339    PT Stop Time 1430    PT Time Calculation (min) 51 min    Activity Tolerance Patient tolerated treatment well    Behavior During Therapy WFL for tasks assessed/performed             Past Medical History:  Diagnosis Date   Acid reflux    Arthritis    Eczema    High blood pressure    High cholesterol    Past Surgical History:  Procedure Laterality Date   ABDOMINAL HYSTERECTOMY     ANAL FISSURECTOMY     BACK SURGERY  Lumbar   CARPAL TUNNEL RELEASE Right    COLONOSCOPY N/A 02/21/2017   Procedure: COLONOSCOPY;  Surgeon: Danie Binder, MD;  [facility];  Service: Endoscopy;  Laterality: N/A;  8:30 am - patient refused to move up at all   [place] Left 03/13/2021   Procedure: [place];  Surgeon: Carole Civil, MD;  Location: [facility];  Service: Orthopedics;  Laterality: Left;   VESICOVAGINAL FISTULA CLOSURE W/ TAH     Patient Active Problem List   Diagnosis Date Noted   Chronic constipation 03/23/2021   Hypertriglyceridemia 03/23/2021   Postoperative anemia due to acute blood loss 03/19/2021   Unspecified protein-calorie malnutrition ([place]) 03/19/2021   Osteoarthritis of left hip 03/13/2021   S/P total left hip arthroplasty 03/13/2021   Unilateral primary osteoarthritis, left hip    Special screening for malignant neoplasms, colon    Primary osteoarthritis of right knee 01/22/2014   Gout of big toe 07/24/2013   Osteoarthritis of right knee 07/24/2013   OA (osteoarthritis) of knee  06/20/2012   Effusion of knee joint 05/16/2012   Arthritis of knee, degenerative 05/16/2012   Knee pain 05/16/2012   KNEE PAIN 06/17/2009   COUGH 02/12/2009   HIP, ARTHRITIS, DEGEN./OSTEO 12/06/2007   OBESITY, TRUNCAL 04/18/2007   TOBACCO USER 04/18/2007   HYPERTENSION 04/18/2007   ALLERGIC RHINITIS 04/18/2007   PULMONARY NODULE 04/18/2007   DYSPNEA 04/18/2007    PCP: Laural Roes , MD  REFERRING PROVIDER: Iona Beard, MD   REFERRING DIAG: PT eval/tx for pain in rt hip   THERAPY DIAG:  Difficulty in walking, not elsewhere classified  Pain in right hip  Other abnormalities of gait and mobility  Rationale for Evaluation and Treatment Rehabilitation  ONSET DATE: April 2023  SUBJECTIVE:   SUBJECTIVE STATEMENT: Patient reports she underwent Lt hip THA Dec 2022. Had HHPT afterwards but did not finish. She was walking well until April. Then states she developed aches in all of her joints. Reports hx of Rt THA 18 years ago. She denies any additional pain to the left or right hip but noticed she began to have more trouble walking. She purposefully lost weight prior to surgery for Lt hip, down to approx 219#, but has steadily gained this weight back to 230s now. She is using a SPC outside now to help walk. Having  a lot of trouble navigating steps. Occasionally uses walker in house to carry things.   PERTINENT HISTORY: See EMR  PAIN:  Are you having pain? No  PRECAUTIONS: None  WEIGHT BEARING RESTRICTIONS No  FALLS:  Has patient fallen in last 6 months? 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  PLOF: Independent  PATIENT GOALS Improve ability to walk, go shopping.   OBJECTIVE:   DIAGNOSTIC FINDINGS:  FINDINGS: Assessment is limited by patient body habitus. Bilateral hip arthroplasties are again noted. No definite acute fracture or dislocation is identified. There is asymmetric heterotopic ossification adjacent to the proximal left femur. Prior lumbar fusion is noted.   IMPRESSION: No acute osseous abnormality identified.    PATIENT SURVEYS:  FOTO 36%  COGNITION:  Overall cognitive status: Within functional limits for tasks assessed     SENSATION: WFL   LOWER EXTREMITY ROM:  Active ROM Right eval Left eval  Hip flexion    Hip extension    Hip abduction    Hip adduction    Hip internal rotation 3 14  Hip external rotation 24 31  Knee flexion    Knee extension    Ankle dorsiflexion    Ankle plantarflexion    Ankle inversion    Ankle eversion     (Blank rows = not tested)  LOWER EXTREMITY MMT:  MMT Right eval Left eval  Hip flexion 4+ 5  Hip extension 4+ 4+  Hip abduction 5 5  Hip adduction 4+ 4+  Hip internal rotation    Hip external rotation 4+ 4+  Knee flexion 5 5  Knee extension 5 5  Ankle dorsiflexion 5 4+  Ankle plantarflexion    Ankle inversion    Ankle eversion     (Blank rows = not tested)    FUNCTIONAL TESTS:  5 times sit to stand: 16.1 sec 2 minute walk test: 36' with SPC  - Requires 2 standing rest breaks to complete  GAIT: Distance walked: 174' Assistive device utilized: Single point cane Level of assistance: Modified independence Comments: Required 2 standing rest breaks during 2MWT, demonstrates antalgic gait pattern, decreased stance time on Rt with notable Rt genuvalgus, over pronation of Lt foot, increased lateral sway. SpO2 dropped to 89-90% during ambulatory bout on RA - severe DOE, recovers in approx 2 min of seated rest. Baseline 95% (wearing nail polish)    TODAY'S TREATMENT: Eval MMT 2MWT 5x Sit to stand HEP Education   PATIENT EDUCATION:  Education details: Findings, POC, PT role, increase activity  level Person educated: Patient Education method: Explanation Education comprehension: verbalized understanding   HOME EXERCISE PROGRAM: Access Code: [email or web address] URL: [email or web address] Date: 01/19/2022 Prepared by: Candie Mile  Exercises - Sit to Stand  - 2 x daily - 7 x weekly - 2 sets - 5-10 reps - Mini Squat with Counter Support  - 2 x daily - 7 x weekly - 2 sets - 5-10 reps - Standing Marching  - 2 x daily - 7 x weekly - 2 sets - 10 reps - Standing Hip Abduction with Counter Support  - 2 x daily - 7 x weekly - 2 sets - 10 reps  ASSESSMENT:  CLINICAL IMPRESSION: Patient is a 76 y.o. female who  was seen today for physical therapy evaluation and treatment for difficulty walking with hx of bil hip THA.     OBJECTIVE IMPAIRMENTS Abnormal gait, cardiopulmonary status limiting activity, decreased activity tolerance, decreased balance, decreased endurance, decreased knowledge of condition, decreased knowledge of use of DME, decreased mobility, difficulty walking, decreased ROM, decreased strength, decreased safety awareness, hypomobility, and obesity.   ACTIVITY LIMITATIONS carrying, squatting, stairs, transfers, and locomotion level  PARTICIPATION LIMITATIONS: cleaning, laundry, shopping, community activity, and yard work  [place] Age, Fitness, and 1 comorbidity: obesity  are also affecting patient's functional outcome.   REHAB POTENTIAL: Good  CLINICAL DECISION MAKING: Stable/uncomplicated  EVALUATION COMPLEXITY: Low   GOALS: Goals reviewed with patient? Yes  SHORT TERM GOALS: Target date: 02/09/22  Patient will be independent with initial HEP and self-management strategies to improve functional outcomes Baseline: Initiated Goal status: INITIAL   2. Patient will report attendance and tolerance at senior center group exercise class for gradual increase in activity levels for increased mobility.  Baseline: Not going but plans to  Goal status:  INITIAL  LONG TERM GOALS: Target date: 03/02/22  Patient will be independent with advanced HEP and self-management strategies to improve functional outcomes Baseline:  Goal status: INITIAL  2.  Patient will improve FOTO score to predicted value of 52%to indicate improvement in functional outcomes Baseline: 36% Goal status: INITIAL  3.  Patient will improve 5 time sit to stand <12.6 seconds to show significant improvement in functional strength to age related norms. Baseline: 16.1 sec Goal status: INITIAL  4. Patient will have equal to or > 4+/5 MMT throughout BIL LEs to improve ability to perform functional mobility, stair ambulation and ADLs.  Baseline: see above Goal status: INITIAL  5. Patient will be able to ambulate at least 250 feet during 2MWT with LRAD to demonstrate improved ability to perform functional mobility and associated tasks. Baseline: 174' with SPC SpO2 89-90% on RA; 2 standing breaks to complete Goal status: INITIAL    PLAN: PT FREQUENCY: 2x/week  PT DURATION: 6 weeks  PLANNED INTERVENTIONS: Therapeutic exercises, Therapeutic activity, Neuromuscular re-education, Balance training, Gait training, Patient/Family education, Self Care, Joint mobilization, Joint manipulation, Stair training, DME instructions, Dry Needling, Spinal manipulation, Spinal mobilization, Cryotherapy, Moist heat, Taping, Ultrasound, Biofeedback, Ionotophoresis '4mg'$ /ml Dexamethasone, Manual therapy, and Re-evaluation  PLAN FOR NEXT SESSION: Progress gross strength and endurance;  monitor SpO2. Gait training for symmetry and efficiency. Stair training. Aim for community exercise program for maintenance once she has completed skilled rehab.   Candie Mile, PT, DPT Physical Therapist Acute Rehabilitation Services [facility]  01/19/2022, 2:46 PM

## 2022-01-28 ENCOUNTER — Ambulatory Visit (HOSPITAL_COMMUNITY): Payer: Medicare Other | Admitting: Physical Therapy

## 2022-01-28 DIAGNOSIS — R262 Difficulty in walking, not elsewhere classified: Secondary | ICD-10-CM

## 2022-01-28 DIAGNOSIS — R2689 Other abnormalities of gait and mobility: Secondary | ICD-10-CM | POA: Diagnosis not present

## 2022-01-28 DIAGNOSIS — M25552 Pain in left hip: Secondary | ICD-10-CM | POA: Diagnosis not present

## 2022-01-28 DIAGNOSIS — M25551 Pain in right hip: Secondary | ICD-10-CM

## 2022-01-28 NOTE — Therapy (Addendum)
OUTPATIENT PHYSICAL THERAPY LOWER EXTREMITY Treatment   Patient Name: Jennifer Bennett MRN: [id number] DOB:15-Oct-1945, 76 y.o., female Today's Date: 01/28/2022   PT End of Session - 01/28/22 1346     Visit Number 2    Number of Visits 12    Date for PT Re-Evaluation 03/02/22    Authorization Type UHC Medicare    Authorization Time Period No VL, No Auth    Progress Note Due on Visit 10    PT Start Time 1301    PT Stop Time 1348    PT Time Calculation (min) 47 min    Activity Tolerance Patient tolerated treatment well    Behavior During Therapy WFL for tasks assessed/performed              Past Medical History:  Diagnosis Date   Acid reflux    Arthritis    Eczema    High blood pressure    High cholesterol    Past Surgical History:  Procedure Laterality Date   ABDOMINAL HYSTERECTOMY     ANAL FISSURECTOMY     BACK SURGERY  Lumbar   CARPAL TUNNEL RELEASE Right    COLONOSCOPY N/A 02/21/2017   Procedure: COLONOSCOPY;  Surgeon: Danie Binder, MD;  [facility];  Service: Endoscopy;  Laterality: N/A;  8:30 am - patient refused to move up at all   [place] Left 03/13/2021   Procedure: [place];  Surgeon: Carole Civil, MD;  Location: [facility];  Service: Orthopedics;  Laterality: Left;   VESICOVAGINAL FISTULA CLOSURE W/ TAH     Patient Active Problem List   Diagnosis Date Noted   Chronic constipation 03/23/2021   Hypertriglyceridemia 03/23/2021   Postoperative anemia due to acute blood loss 03/19/2021   Unspecified protein-calorie malnutrition ([place]) 03/19/2021   Osteoarthritis of left hip 03/13/2021   S/P total left hip arthroplasty 03/13/2021   Unilateral primary osteoarthritis, left hip    Special screening for malignant neoplasms, colon    Primary osteoarthritis of right knee 01/22/2014   Gout of big toe 07/24/2013   Osteoarthritis of right knee 07/24/2013   OA (osteoarthritis) of knee  06/20/2012   Effusion of knee joint 05/16/2012   Arthritis of knee, degenerative 05/16/2012   Knee pain 05/16/2012   KNEE PAIN 06/17/2009   COUGH 02/12/2009   HIP, ARTHRITIS, DEGEN./OSTEO 12/06/2007   OBESITY, TRUNCAL 04/18/2007   TOBACCO USER 04/18/2007   HYPERTENSION 04/18/2007   ALLERGIC RHINITIS 04/18/2007   PULMONARY NODULE 04/18/2007   DYSPNEA 04/18/2007    PCP: Laural Roes , MD  REFERRING PROVIDER: Iona Beard, MD   REFERRING DIAG: PT eval/tx for pain in rt hip   THERAPY DIAG:  Difficulty in walking, not elsewhere classified  Pain in right hip  Other abnormalities of gait and mobility  Pain in left hip  Rationale for Evaluation and Treatment Rehabilitation  ONSET DATE: April 2023  SUBJECTIVE:   SUBJECTIVE STATEMENT: Pt states she has more stiffness than pain  PERTINENT HISTORY: See EMR  PAIN:  Are you having pain? No  PRECAUTIONS: None  WEIGHT BEARING RESTRICTIONS No  FALLS:  Has patient fallen in last 6 months? 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  PLOF: Independent  PATIENT GOALS Improve ability to walk, go shopping.   OBJECTIVE:   DIAGNOSTIC FINDINGS: FINDINGS: Assessment is limited by patient body habitus. Bilateral hip arthroplasties are again noted. No definite acute fracture or dislocation is identified. There is asymmetric heterotopic ossification adjacent to the proximal left femur. Prior lumbar fusion is noted.   IMPRESSION: No acute osseous abnormality identified.    PATIENT SURVEYS:  FOTO 36%  COGNITION:  Overall cognitive status: Within functional limits for tasks assessed     SENSATION: WFL   LOWER EXTREMITY ROM:  Active ROM Right eval  Left eval  Hip flexion    Hip extension    Hip abduction    Hip adduction    Hip internal rotation 3 14  Hip external rotation 24 31  Knee flexion    Knee extension    Ankle dorsiflexion    Ankle plantarflexion    Ankle inversion    Ankle eversion     (Blank rows = not tested)  LOWER EXTREMITY MMT:  MMT Right eval Left eval  Hip flexion 4+ 5  Hip extension 4+ 4+  Hip abduction 5 5  Hip adduction 4+ 4+  Hip internal rotation    Hip external rotation 4+ 4+  Knee flexion 5 5  Knee extension 5 5  Ankle dorsiflexion 5 4+  Ankle plantarflexion    Ankle inversion    Ankle eversion     (Blank rows = not tested)    FUNCTIONAL TESTS:  5 times sit to stand: 16.1 sec 2 minute walk test: 3' with SPC  - Requires 2 standing rest breaks to complete  GAIT: Distance walked: 174' Assistive device utilized: Single point cane Level of assistance: Modified independence Comments: Required 2 standing rest breaks during 2MWT, demonstrates antalgic gait pattern, decreased stance time on Rt with notable Rt genuvalgus, over pronation of Lt foot, increased lateral sway. SpO2 dropped to 89-90% during ambulatory bout on RA - severe DOE, recovers in approx 2 min of seated rest. Baseline 95% (wearing nail polish)    TODAY'S TREATMENT: 01/28/22 Standing:  Gt with cane x 177f                   Heel raises x 15                   Functional squat x 15                   Side stepping with theraband x 15  green                   Hip excursion x 3  Sitting:       sit to stand x 10 Supine:      bridge x 10                   Isometric hip adduction x 10                    Bent knee lift stabilizing back  x 10    Eval MMT 2MWT 5x Sit to stand HEP Education   PATIENT EDUCATION:  Education details: Findings, POC, PT role, increase activity level Person educated: Patient Education method: Explanation Education comprehension: verbalized understanding   HOME EXERCISE PROGRAM:               10/19  Hip excursions               - Heel Raises with Counter Support  - 2 x daily - 7 x weekly - 1 sets - 15 reps - 3" hold                - Supine Bridge  - 2 x daily - 7 x weekly - 1 sets - 15 reps - 3-5" hold  Access Code: [email or web address] URL: [email or web address] Date: 01/19/2022 Prepared by: Candie Mile  Exercises - Sit to Stand  - 2 x daily - 7 x weekly - 2 sets - 5-10 reps - Mini Squat with Counter Support  - 2 x daily - 7 x weekly - 2 sets - 5-10 reps - Standing Marching  - 2 x daily - 7 x weekly - 2 sets - 10 reps - Standing Hip Abduction with Counter Support  - 2 x daily - 7 x weekly - 2 sets - 10 reps  ASSESSMENT:  CLINICAL IMPRESSION: Patient is a 76 y.o. female who was seen today for physical therapy evaluation and treatment for difficulty walking with hx of bil hip THA.  Noted weakness in B hip mm.  Therapist added strengthening and stretching exercises.  Updated HEP    OBJECTIVE IMPAIRMENTS Abnormal gait, cardiopulmonary status limiting activity, decreased activity tolerance, decreased balance, decreased endurance, decreased knowledge of condition, decreased knowledge of use of DME, decreased mobility, difficulty walking, decreased ROM, decreased strength, decreased safety awareness, hypomobility, and obesity.   ACTIVITY LIMITATIONS carrying, squatting, stairs, transfers, and locomotion level  PARTICIPATION LIMITATIONS: cleaning, laundry, shopping, community activity, and yard work  [place] Age, Fitness, and 1 comorbidity: obesity  are also affecting patient's functional outcome.   REHAB POTENTIAL: Good  CLINICAL DECISION MAKING: Stable/uncomplicated  EVALUATION COMPLEXITY: Low   GOALS: Goals reviewed with patient? Yes  SHORT TERM GOALS: Target date: 02/09/22  Patient will be independent with initial HEP and self-management strategies to improve functional outcomes Baseline: Initiated Goal status: INITIAL   2.  Patient will report attendance and tolerance at senior center group exercise class for gradual increase in activity levels for increased mobility.  Baseline: Not going but plans to  Goal status: INITIAL  LONG TERM GOALS: Target date: 03/02/22  Patient will be independent with advanced HEP and self-management strategies to improve functional outcomes Baseline:  Goal status: INITIAL  2.  Patient will improve FOTO score to predicted value of 52%to indicate improvement in functional outcomes Baseline: 36% Goal status: INITIAL  3.  Patient will improve 5 time sit to stand <12.6 seconds to show significant improvement in functional strength to age related norms. Baseline: 16.1 sec Goal status: INITIAL  4. Patient will have equal to or > 4+/5 MMT throughout BIL LEs to improve ability to perform functional mobility, stair ambulation and ADLs.  Baseline: see above Goal status: INITIAL  5. Patient will be able to ambulate at least 250 feet during 2MWT with LRAD to demonstrate improved ability to perform functional mobility and associated tasks. Baseline: 174' with SPC SpO2 89-90% on RA; 2 standing breaks to complete Goal status: INITIAL    PLAN: PT FREQUENCY: 2x/week  PT DURATION: 6 weeks  PLANNED INTERVENTIONS: Therapeutic exercises, Therapeutic activity, Neuromuscular re-education, Balance training, Gait training, Patient/Family education, Self Care, Joint mobilization, Joint manipulation, Stair training, DME instructions, Dry Needling, Spinal manipulation, Spinal mobilization, Cryotherapy, Moist heat, Taping, Ultrasound, Biofeedback, Ionotophoresis '4mg'$ /ml Dexamethasone, Manual therapy, and Re-evaluation  PLAN FOR NEXT SESSION: Progress gross  strength and endurance;  monitor SpO2. Gait training for symmetry and efficiency. Stair training. Aim for community exercise program for maintenance once she has completed skilled rehab.   Rayetta Humphrey, PT CLT [phone] 01/28/2022,  1:42 PM

## 2022-02-10 ENCOUNTER — Ambulatory Visit (HOSPITAL_COMMUNITY): Payer: Medicare Other | Attending: Family Medicine | Admitting: Physical Therapy

## 2022-02-10 DIAGNOSIS — M25552 Pain in left hip: Secondary | ICD-10-CM | POA: Diagnosis not present

## 2022-02-10 DIAGNOSIS — M25551 Pain in right hip: Secondary | ICD-10-CM | POA: Insufficient documentation

## 2022-02-10 DIAGNOSIS — R262 Difficulty in walking, not elsewhere classified: Secondary | ICD-10-CM | POA: Insufficient documentation

## 2022-02-10 DIAGNOSIS — R2689 Other abnormalities of gait and mobility: Secondary | ICD-10-CM | POA: Diagnosis not present

## 2022-02-10 NOTE — Therapy (Addendum)
OUTPATIENT PHYSICAL THERAPY LOWER EXTREMITY Treatment   Patient Name: Jennifer Bennett MRN: [id number] DOB:1945-04-24, 76 y.o., female Today's Date: 02/10/2022  PT End of Session - 02/10/22 1446       Visit Number 3     Number of Visits 12     Date for PT Re-Evaluation 03/02/22     Authorization Type [employer] Medicare     Authorization Time Period No VL, No Auth     Progress Note Due on Visit 10     PT Start Time 1351    PT Stop Time 1430    PT Time Calculation (min) 39 min     Activity Tolerance Patient tolerated treatment well     Behavior During Therapy WFL for tasks assessed/performed            Past Medical History:  Diagnosis Date   Acid reflux    Arthritis    Eczema    High blood pressure    High cholesterol    Past Surgical History:  Procedure Laterality Date   ABDOMINAL HYSTERECTOMY     ANAL FISSURECTOMY     BACK SURGERY  Lumbar   CARPAL TUNNEL RELEASE Right    COLONOSCOPY N/A 02/21/2017   Procedure: COLONOSCOPY;  Surgeon: Danie Binder, MD;  [facility]: [facility];  Service: Endoscopy;  Laterality: N/A;  8:30 am - patient refused to move up at all   [place] Left 03/13/2021   Procedure: [place];  Surgeon: Carole Civil, MD;  Location: [facility];  Service: Orthopedics;  Laterality: Left;   VESICOVAGINAL FISTULA CLOSURE W/ TAH     Patient Active Problem List   Diagnosis Date Noted   Chronic constipation 03/23/2021   Hypertriglyceridemia 03/23/2021   Postoperative anemia due to acute blood loss 03/19/2021   Unspecified protein-calorie malnutrition ([place]) 03/19/2021   Osteoarthritis of left hip 03/13/2021   S/P total left hip arthroplasty 03/13/2021   Unilateral primary osteoarthritis, left hip    Special screening for malignant neoplasms, colon    Primary osteoarthritis of right knee 01/22/2014   Gout of big toe 07/24/2013   Osteoarthritis of right knee 07/24/2013   OA (osteoarthritis) of knee  06/20/2012   Effusion of knee joint 05/16/2012   Arthritis of knee, degenerative 05/16/2012   Knee pain 05/16/2012   KNEE PAIN 06/17/2009   COUGH 02/12/2009   HIP, ARTHRITIS, DEGEN./OSTEO 12/06/2007   OBESITY, TRUNCAL 04/18/2007   TOBACCO USER 04/18/2007   HYPERTENSION 04/18/2007   ALLERGIC RHINITIS 04/18/2007   PULMONARY NODULE 04/18/2007   DYSPNEA 04/18/2007    PCP: Laural Roes , MD  REFERRING PROVIDER: Iona Beard, MD   REFERRING DIAG: PT eval/tx for pain in rt hip   THERAPY DIAG:  Difficulty in walking, not elsewhere classified  Pain in right hip  Other abnormalities of gait and mobility  Pain in left hip  Rationale for Evaluation and Treatment Rehabilitation  ONSET DATE: April 2023  SUBJECTIVE:   SUBJECTIVE STATEMENT: Pt states she does  some exercises everyday but not enough.  Today is a good day there is no pain in her hips   PERTINENT HISTORY: See EMR  PAIN:  Are you having pain? No  PRECAUTIONS: None  WEIGHT BEARING RESTRICTIONS No  FALLS:  Has patient fallen in last 6 months? 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  PLOF: Independent  PATIENT GOALS Improve ability to walk, go shopping.   OBJECTIVE:   DIAGNOSTIC FINDINGS: FINDINGS: Assessment is limited by patient body habitus. Bilateral hip arthroplasties are again noted. No definite acute fracture or dislocation is identified. There is asymmetric heterotopic ossification adjacent to the proximal left femur. Prior lumbar fusion is noted.   IMPRESSION: No acute osseous abnormality identified.    PATIENT SURVEYS:  FOTO 36%  COGNITION:  Overall cognitive status: Within functional limits for tasks  assessed     SENSATION: WFL   LOWER EXTREMITY ROM:  Active ROM Right eval Left eval  Hip flexion    Hip extension    Hip abduction    Hip adduction    Hip internal rotation 3 14  Hip external rotation 24 31  Knee flexion    Knee extension    Ankle dorsiflexion    Ankle plantarflexion    Ankle inversion    Ankle eversion     (Blank rows = not tested)  LOWER EXTREMITY MMT:  MMT Right eval Right 11/1 Left eval LT 11/1  Hip flexion 4+  5   Hip extension 4+  4+   Hip abduction 5 Tested against gravity 2- 5 Tested against gravity 2-  Hip adduction 4+  4+   Hip internal rotation      Hip external rotation 4+  4+   Knee flexion 5  5   Knee extension 5  5   Ankle dorsiflexion 5  4+   Ankle plantarflexion      Ankle inversion      Ankle eversion       (Blank rows = not tested)    FUNCTIONAL TESTS:  5 times sit to stand: 16.1 sec 2 minute walk test: 80' with SPC  - Requires 2 standing rest breaks to complete  GAIT: Distance walked: 174' Assistive device utilized: Single point cane Level of assistance: Modified independence Comments: Required 2 standing rest breaks during 2MWT, demonstrates antalgic gait pattern, decreased stance time on Rt with notable Rt genuvalgus, over pronation of Lt foot, increased lateral sway. SpO2 dropped to 89-90% during ambulatory bout on RA - severe DOE, recovers in approx 2 min of seated rest. Baseline 95% (wearing nail polish)    TODAY'S TREATMENT: 02/10/22 Sit: Sit to stand x 15 Diaphragm breathing x 10  Standing:  Heel raises x 10  Hip excursions x 3 Functional squat x 15 Side stepping x 2 RT  Supine: Diaphragm breath with box on stomach x 10 Hip adduction isometric x 10  Bent knee lift  x 10  Side lying  Clam B  x 10  Hip abduction x 5   01/28/22 Standing:  Gt with cane x 110f                   Heel raises x 15                   Functional squat x 15                   Side stepping with theraband x 15  green                    Hip excursion x 3  Sitting:       sit to stand  x 10 Supine:      bridge x 10                   Isometric hip adduction x 10                    Bent knee lift stabilizing back  x 10    Eval MMT 2MWT 5x Sit to stand HEP Education   PATIENT EDUCATION:  Education details: Findings, POC, PT role, increase activity level Person educated: Patient Education method: Explanation Education comprehension: verbalized understanding   HOME EXERCISE PROGRAM:               02/10/2022                 - Supine Diaphragmatic Breathing  - 1 x daily - 7 x weekly - 1 sets - 10 reps - 3" hold - Seated Diaphragmatic Breathing  - 2 x daily - 7 x weekly - 1 sets - 10 reps - 3" hold - Diaphragmatic Breathing to Reduce Intra-abdominal Pressure: Lifting a Basket  - 1 x daily - 7 x weekly     Sidelying Hip Abduction  - 1 x daily - 7 x weekly - 1 sets - 10 reps - 3-5" hold           10/19                 Hip excursions               - Heel Raises with Counter Support  - 2 x daily - 7 x weekly - 1 sets - 15 reps - 3" hold                - Supine Bridge  - 2 x daily - 7 x weekly - 1 sets - 15 reps - 3-5" hold  Access Code: [email or web address] URL: [email or web address] Date: 01/19/2022 Prepared by: Candie Mile  Exercises - Sit to Stand  - 2 x daily - 7 x weekly - 2 sets - 5-10 reps - Mini Squat with Counter Support  - 2 x daily - 7 x weekly - 2 sets - 5-10 reps - Standing Marching  - 2 x daily - 7 x weekly - 2 sets - 10 reps - Standing Hip Abduction with Counter Support  - 2 x daily - 7 x weekly - 2 sets - 10 reps  ASSESSMENT:  CLINICAL IMPRESSION: Therapist began side lying hip strengthening exercises noting that pt is only able to lift leg approximately an inch off the ground which is significantly different than the mm test given in evaluation.  Therapist assumes therapist tested pt sitting isometric and received an error as result . PT  HEP updated to include diaphragm breathing to  improve activity tolerance and hip abduction strength to improve walking tolerance.   OBJECTIVE IMPAIRMENTS Abnormal gait, cardiopulmonary status limiting activity, decreased activity tolerance, decreased balance, decreased endurance, decreased knowledge of condition, decreased knowledge of use of DME, decreased mobility, difficulty walking, decreased ROM, decreased strength, decreased safety awareness, hypomobility, and obesity.   ACTIVITY LIMITATIONS carrying, squatting, stairs, transfers, and locomotion level  PARTICIPATION LIMITATIONS: cleaning, laundry, shopping, community activity, and yard work  [place] Age, Fitness, and 1 comorbidity: obesity  are also affecting patient's functional outcome.   REHAB POTENTIAL: Good  CLINICAL DECISION MAKING: Stable/uncomplicated  EVALUATION COMPLEXITY: Low   GOALS: Goals reviewed with patient? Yes  SHORT TERM GOALS: Target date: 02/09/22  Patient will be independent with initial HEP and self-management strategies to improve functional outcomes Baseline: Initiated Goal status: IN PROGRESS   2. Patient will report attendance and tolerance at senior center group exercise class for gradual increase in activity levels for increased mobility.  Baseline: Not going but plans to  Goal status: INITIAL  LONG TERM GOALS: Target date: 03/02/22  Patient will be independent with advanced HEP and self-management strategies to improve functional outcomes Baseline:  Goal status: IN PROGRESS  2.  Patient will improve FOTO score to predicted value of 52%to indicate improvement in functional outcomes Baseline: 36% Goal status: IN PROGRESS  3.  Patient will improve 5 time sit to stand <12.6 seconds to show significant improvement in functional strength to age related norms. Baseline: 16.1 sec Goal status: IN PROGRESS  4. Patient will have equal to or > 4+/5 MMT throughout BIL LEs to improve ability to perform functional mobility, stair  ambulation and ADLs.  Baseline: see above Goal status: IN PROGRESS  5. Patient will be able to ambulate at least 250 feet during 2MWT with LRAD to demonstrate improved ability to perform functional mobility and associated tasks. Baseline: 174' with SPC SpO2 89-90% on RA; 2 standing breaks to complete Goal status: IN PROGRESS    PLAN: PT FREQUENCY: 2x/week  PT DURATION: 6 weeks  PLANNED INTERVENTIONS: Therapeutic exercises, Therapeutic activity, Neuromuscular re-education, Balance training, Gait training, Patient/Family education, Self Care, Joint mobilization, Joint manipulation, Stair training, DME instructions, Dry Needling, Spinal manipulation, Spinal mobilization, Cryotherapy, Moist heat, Taping, Ultrasound, Biofeedback, Ionotophoresis '4mg'$ /ml Dexamethasone, Manual therapy, and Re-evaluation  PLAN FOR NEXT SESSION: Progress gross strength and endurance;  monitor SpO2. Gait training for symmetry and efficiency. Stair training. Aim for community exercise program for maintenance once she has completed skilled rehab.   Rayetta Humphrey, PT CLT [phone] 02/12/2022, 15:15  PM

## 2022-02-12 ENCOUNTER — Encounter (HOSPITAL_COMMUNITY): Payer: Self-pay | Admitting: Physical Therapy

## 2022-02-12 ENCOUNTER — Ambulatory Visit (HOSPITAL_COMMUNITY): Payer: Medicare Other

## 2022-02-12 DIAGNOSIS — R262 Difficulty in walking, not elsewhere classified: Secondary | ICD-10-CM | POA: Diagnosis not present

## 2022-02-12 DIAGNOSIS — M25551 Pain in right hip: Secondary | ICD-10-CM | POA: Diagnosis not present

## 2022-02-12 DIAGNOSIS — M25552 Pain in left hip: Secondary | ICD-10-CM | POA: Diagnosis not present

## 2022-02-12 DIAGNOSIS — R2689 Other abnormalities of gait and mobility: Secondary | ICD-10-CM

## 2022-02-12 NOTE — Therapy (Addendum)
OUTPATIENT PHYSICAL THERAPY LOWER EXTREMITY Treatment   Patient Name: Jennifer Bennett MRN: [id number] DOB:[birth year], 76 y.o., female Today's Date: 02/10/2022     PT End of Session - 02/12/22 1453     Visit Number 4    Number of Visits 12    Date for PT Re-Evaluation 03/02/22    Authorization Type [employer]HC Medicare    Authorization Time Period No VL, No Auth    Progress Note Due on Visit 10    PT Start Time 1435    PT Stop Time 1515    PT Time Calculation (min) 40 min    Equipment Utilized During Treatment --   SBA/HHA   Activity Tolerance Patient tolerated treatment well    Behavior During Therapy WFL for tasks assessed/performed               Past Medical History:  Diagnosis Date   Acid reflux    Arthritis    Eczema    High blood pressure    High cholesterol    Past Surgical History:  Procedure Laterality Date   ABDOMINAL HYSTERECTOMY     ANAL FISSURECTOMY     BACK SURGERY  Lumbar   CARPAL TUNNEL RELEASE Right    COLONOSCOPY N/A 02/21/2017   Procedure: COLONOSCOPY;  Surgeon: Danie Binder, MD;  [facility];  Service: Endoscopy;  Laterality: N/A;  8:30 am - patient refused to move up at all   [place] Left 03/13/2021   Procedure: [place];  Surgeon: Carole Civil, MD;  Location: [facility];  Service: Orthopedics;  Laterality: Left;   VESICOVAGINAL FISTULA CLOSURE W/ TAH     Patient Active Problem List   Diagnosis Date Noted   Chronic constipation 03/23/2021   Hypertriglyceridemia 03/23/2021   Postoperative anemia due to acute blood loss 03/19/2021   Unspecified protein-calorie malnutrition ([place]) 03/19/2021   Osteoarthritis of left hip 03/13/2021   S/P total left hip arthroplasty 03/13/2021   Unilateral primary osteoarthritis, left hip    Special screening for malignant neoplasms, colon    Primary osteoarthritis of right knee 01/22/2014   Gout of big toe 07/24/2013   Osteoarthritis of  right knee 07/24/2013   OA (osteoarthritis) of knee 06/20/2012   Effusion of knee joint 05/16/2012   Arthritis of knee, degenerative 05/16/2012   Knee pain 05/16/2012   KNEE PAIN 06/17/2009   COUGH 02/12/2009   HIP, ARTHRITIS, DEGEN./OSTEO 12/06/2007   OBESITY, TRUNCAL 04/18/2007   TOBACCO USER 04/18/2007   HYPERTENSION 04/18/2007   ALLERGIC RHINITIS 04/18/2007   PULMONARY NODULE 04/18/2007   DYSPNEA 04/18/2007    PCP: Laural Roes , MD  REFERRING PROVIDER: Iona Beard, MD   REFERRING DIAG: PT eval/tx for pain in rt hip   THERAPY DIAG:  Difficulty in walking, not elsewhere classified  Pain in right hip  Other abnormalities of gait and mobility  Pain in left hip  Rationale for Evaluation and Treatment Rehabilitation  ONSET DATE: April 2023  SUBJECTIVE:   SUBJECTIVE STATEMENT:  Pt stated she gets short of breath walking.  Reports she does exercises everyday but not all of them.  Reports she got a cramp in Rt calf while completing exercise night last.  Reports increase ease with standing from chair at home.  PERTINENT HISTORY: See EMR  PAIN:  Are you having pain? No  PRECAUTIONS: None  WEIGHT BEARING RESTRICTIONS No  FALLS:  Has patient fallen in last 6 months? 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  PLOF: Independent  PATIENT GOALS Improve ability to walk, go shopping.   OBJECTIVE:   DIAGNOSTIC FINDINGS: FINDINGS: Assessment is limited by patient body habitus. Bilateral hip arthroplasties are again noted. No definite acute fracture or dislocation is identified. There is asymmetric heterotopic ossification adjacent to the proximal left femur. Prior lumbar fusion is noted.    IMPRESSION: No acute osseous abnormality identified.    PATIENT SURVEYS:  FOTO 36%  COGNITION:  Overall cognitive status: Within functional limits for tasks assessed     SENSATION: WFL   LOWER EXTREMITY ROM:  Active ROM Right eval Left eval  Hip flexion    Hip extension    Hip abduction    Hip adduction    Hip internal rotation 3 14  Hip external rotation 24 31  Knee flexion    Knee extension    Ankle dorsiflexion    Ankle plantarflexion    Ankle inversion    Ankle eversion     (Blank rows = not tested)  LOWER EXTREMITY MMT:  MMT Right eval Right 11/1 Left eval LT 11/1  Hip flexion 4+  5   Hip extension 4+  4+   Hip abduction 5 Tested against gravity 2- 5 Tested against gravity 2-  Hip adduction 4+  4+   Hip internal rotation      Hip external rotation 4+  4+   Knee flexion 5  5   Knee extension 5  5   Ankle dorsiflexion 5  4+   Ankle plantarflexion      Ankle inversion      Ankle eversion       (Blank rows = not tested)    FUNCTIONAL TESTS:  5 times sit to stand: 16.1 sec 2 minute walk test: 13' with SPC  - Requires 2 standing rest breaks to complete  GAIT: Distance walked: 174' Assistive device utilized: Single point cane Level of assistance: Modified independence Comments: Required 2 standing rest breaks during 2MWT, demonstrates antalgic gait pattern, decreased stance time on Rt with notable Rt genuvalgus, over pronation of Lt foot, increased lateral sway. SpO2 dropped to 89-90% during ambulatory bout on RA - severe DOE, recovers in approx 2 min of seated rest. Baseline 95% (wearing nail polish)    TODAY'S TREATMENT: 02/12/22 STS 2x 10 eccentric control Squat 15x Heel raise 15x 3" Toe raise  Sidestep 3RT, 2sets with theraband around thigh Sidelying: clam with RTB 10x 5"  Hip abduction 2x 5   02/10/22 Sit: Sit to stand x 15 Diaphragm breathing x 10  Standing:  Heel raises x 10  Hip excursions x 3 Functional squat x 15 Side  stepping x 2 RT  Supine: Diaphragm breath with box on stomach x 10 Hip adduction isometric x 10  Bent knee lift  x 10  Side lying  Clam B  x 10  Hip abduction x 5   01/28/22 Standing:  Gt with cane x 184f                   Heel raises x 15                   Functional squat x 15  Side stepping with theraband x 15  green                   Hip excursion x 3  Sitting:       sit to stand x 10 Supine:      bridge x 10                   Isometric hip adduction x 10                    Bent knee lift stabilizing back  x 10    Eval MMT 2MWT 5x Sit to stand HEP Education   PATIENT EDUCATION:  Education details: Findings, POC, PT role, increase activity level Person educated: Patient Education method: Explanation Education comprehension: verbalized understanding   HOME EXERCISE PROGRAM:              02/12/22:  begin walking program   02/10/2022                 - Supine Diaphragmatic Breathing  - 1 x daily - 7 x weekly - 1 sets - 10 reps - 3" hold - Seated Diaphragmatic Breathing  - 2 x daily - 7 x weekly - 1 sets - 10 reps - 3" hold - Diaphragmatic Breathing to Reduce Intra-abdominal Pressure: Lifting a Basket  - 1 x daily - 7 x weekly     Sidelying Hip Abduction  - 1 x daily - 7 x weekly - 1 sets - 10 reps - 3-5" hold           10/19                 Hip excursions               - Heel Raises with Counter Support  - 2 x daily - 7 x weekly - 1 sets - 15 reps - 3" hold                - Supine Bridge  - 2 x daily - 7 x weekly - 1 sets - 15 reps - 3-5" hold  Access Code: [email or web address] URL: [email or web address] Date: 01/19/2022 Prepared by: Candie Mile  Exercises - Sit to Stand  - 2 x daily - 7 x weekly - 2 sets - 5-10 reps - Mini Squat with Counter Support  - 2 x daily - 7 x weekly - 2 sets - 5-10 reps - Standing Marching  - 2 x daily - 7 x weekly - 2 sets - 10 reps - Standing Hip Abduction with Counter Support  - 2 x daily - 7 x weekly - 2 sets  - 10 reps  ASSESSMENT:  CLINICAL IMPRESSION: Pt sitting in WC at session entrance though able to walk with SPC to and from therapist room.  Pt c/o SOB, encouraged to start walking program to improve activity tolerance with verbalized understanding.   Session focus with hip musculature strengthening.  Cueing to stand tall through session and reduce ER with abduction movements.  Pt easily fatigued and required seated rest breaks through session.  No reports of pain through session.    OBJECTIVE IMPAIRMENTS Abnormal gait, cardiopulmonary status limiting activity, decreased activity tolerance, decreased balance, decreased endurance, decreased knowledge of condition, decreased knowledge of use of DME, decreased mobility, difficulty walking, decreased ROM, decreased strength, decreased safety awareness, hypomobility, and obesity.   ACTIVITY LIMITATIONS carrying, squatting, stairs, transfers, and locomotion level  PARTICIPATION LIMITATIONS:  cleaning, laundry, shopping, community activity, and yard work  PERSONAL FACTORS Age, Fitness, and 1 comorbidity: obesity  are also affecting patient's functional outcome.   REHAB POTENTIAL: Good  CLINICAL DECISION MAKING: Stable/uncomplicated  EVALUATION COMPLEXITY: Low   GOALS: Goals reviewed with patient? Yes  SHORT TERM GOALS: Target date: 02/09/22  Patient will be independent with initial HEP and self-management strategies to improve functional outcomes Baseline: Initiated Goal status: IN PROGRESS   2. Patient will report attendance and tolerance at senior center group exercise class for gradual increase in activity levels for increased mobility.  Baseline: Not going but plans to  Goal status: INITIAL  LONG TERM GOALS: Target date: 03/02/22  Patient will be independent with advanced HEP and self-management strategies to improve functional outcomes Baseline:  Goal status: IN PROGRESS  2.  Patient will improve FOTO score to predicted value of  52%to indicate improvement in functional outcomes Baseline: 36% Goal status: IN PROGRESS  3.  Patient will improve 5 time sit to stand <12.6 seconds to show significant improvement in functional strength to age related norms. Baseline: 16.1 sec Goal status: IN PROGRESS  4. Patient will have equal to or > 4+/5 MMT throughout BIL LEs to improve ability to perform functional mobility, stair ambulation and ADLs.  Baseline: see above Goal status: IN PROGRESS  5. Patient will be able to ambulate at least 250 feet during 2MWT with LRAD to demonstrate improved ability to perform functional mobility and associated tasks. Baseline: 174' with SPC SpO2 89-90% on RA; 2 standing breaks to complete Goal status: IN PROGRESS    PLAN: PT FREQUENCY: 2x/week  PT DURATION: 6 weeks  PLANNED INTERVENTIONS: Therapeutic exercises, Therapeutic activity, Neuromuscular re-education, Balance training, Gait training, Patient/Family education, Self Care, Joint mobilization, Joint manipulation, Stair training, DME instructions, Dry Needling, Spinal manipulation, Spinal mobilization, Cryotherapy, Moist heat, Taping, Ultrasound, Biofeedback, Ionotophoresis '4mg'$ /ml Dexamethasone, Manual therapy, and Re-evaluation  PLAN FOR NEXT SESSION: Progress gross strength and endurance;  monitor SpO2. Gait training for symmetry and efficiency. Stair training. Aim for community exercise program for maintenance once she has completed skilled rehab.  Ihor Austin, LPTA/CLT; Delana Meyer [phone]  02/12/2022,

## 2022-02-15 ENCOUNTER — Ambulatory Visit (HOSPITAL_COMMUNITY): Payer: Medicare Other | Admitting: Physical Therapy

## 2022-02-15 ENCOUNTER — Encounter (HOSPITAL_COMMUNITY): Payer: Self-pay | Admitting: Physical Therapy

## 2022-02-15 DIAGNOSIS — R2689 Other abnormalities of gait and mobility: Secondary | ICD-10-CM

## 2022-02-15 DIAGNOSIS — M25551 Pain in right hip: Secondary | ICD-10-CM | POA: Diagnosis not present

## 2022-02-15 DIAGNOSIS — R262 Difficulty in walking, not elsewhere classified: Secondary | ICD-10-CM

## 2022-02-15 DIAGNOSIS — M25552 Pain in left hip: Secondary | ICD-10-CM | POA: Diagnosis not present

## 2022-02-15 NOTE — Therapy (Signed)
OUTPATIENT PHYSICAL THERAPY LOWER EXTREMITY Treatment   Patient Name: Jennifer Bennett MRN: [id number] DOB:1945/12/20, 76 y.o., female Today's Date: 02/10/2022     PT End of Session - 02/15/22 1434     Visit Number 5    Number of Visits 12    Date for PT Re-Evaluation 03/02/22    Authorization Type [employer] Medicare    Authorization Time Period No VL, No Auth    Progress Note Due on Visit 10    PT Start Time 1431    PT Stop Time 1515    PT Time Calculation (min) 44 min    Equipment Utilized During Treatment --   SBA/HHA   Activity Tolerance Patient tolerated treatment well    Behavior During Therapy WFL for tasks assessed/performed               Past Medical History:  Diagnosis Date   Acid reflux    Arthritis    Eczema    High blood pressure    High cholesterol    Past Surgical History:  Procedure Laterality Date   ABDOMINAL HYSTERECTOMY     ANAL FISSURECTOMY     BACK SURGERY  Lumbar   CARPAL TUNNEL RELEASE Right    COLONOSCOPY N/A 02/21/2017   Procedure: COLONOSCOPY;  Surgeon: Danie Binder, MD;  [facility];  Service: Endoscopy;  Laterality: N/A;  8:30 am - patient refused to move up at all   [place] Left 03/13/2021   Procedure: [place];  Surgeon: Carole Civil, MD;  Location: [facility];  Service: Orthopedics;  Laterality: Left;   VESICOVAGINAL FISTULA CLOSURE W/ TAH     Patient Active Problem List   Diagnosis Date Noted   Chronic constipation 03/23/2021   Hypertriglyceridemia 03/23/2021   Postoperative anemia due to acute blood loss 03/19/2021   Unspecified protein-calorie malnutrition ([place]) 03/19/2021   Osteoarthritis of left hip 03/13/2021   S/P total left hip arthroplasty 03/13/2021   Unilateral primary osteoarthritis, left hip    Special screening for malignant neoplasms, colon    Primary osteoarthritis of right knee 01/22/2014   Gout of big toe 07/24/2013   Osteoarthritis of  right knee 07/24/2013   OA (osteoarthritis) of knee 06/20/2012   Effusion of knee joint 05/16/2012   Arthritis of knee, degenerative 05/16/2012   Knee pain 05/16/2012   KNEE PAIN 06/17/2009   COUGH 02/12/2009   HIP, ARTHRITIS, DEGEN./OSTEO 12/06/2007   OBESITY, TRUNCAL 04/18/2007   TOBACCO USER 04/18/2007   HYPERTENSION 04/18/2007   ALLERGIC RHINITIS 04/18/2007   PULMONARY NODULE 04/18/2007   DYSPNEA 04/18/2007    PCP: Laural Roes , MD  REFERRING PROVIDER: Iona Beard, MD   REFERRING DIAG: PT eval/tx for pain in rt hip   THERAPY DIAG:  Difficulty in walking, not elsewhere classified  Pain in right hip  Other abnormalities of gait and mobility  Rationale for Evaluation and Treatment Rehabilitation  ONSET DATE: April 2023  SUBJECTIVE:   SUBJECTIVE STATEMENT:  Patient states she is feeling better about her mobility, slowly but surely. Has been skipping one exercise and improved cramps in calf  PERTINENT HISTORY: See EMR  PAIN:  Are you having pain? No  PRECAUTIONS: None  WEIGHT BEARING RESTRICTIONS No  FALLS:  Has patient fallen in last 6 months? 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  PLOF: Independent  PATIENT GOALS Improve ability to walk, go shopping.   OBJECTIVE:   DIAGNOSTIC FINDINGS: FINDINGS: Assessment is limited by patient body habitus. Bilateral hip arthroplasties are again noted. No definite acute fracture or dislocation is identified. There is asymmetric heterotopic ossification adjacent to the proximal left femur. Prior lumbar fusion is noted.   IMPRESSION: No acute osseous abnormality identified.    PATIENT SURVEYS:  FOTO 36%  COGNITION:  Overall cognitive  status: Within functional limits for tasks assessed     SENSATION: WFL   LOWER EXTREMITY ROM:  Active ROM Right eval Left eval  Hip flexion    Hip extension    Hip abduction    Hip adduction    Hip internal rotation 3 14  Hip external rotation 24 31  Knee flexion    Knee extension    Ankle dorsiflexion    Ankle plantarflexion    Ankle inversion    Ankle eversion     (Blank rows = not tested)  LOWER EXTREMITY MMT:  MMT Right eval Right 11/1 Left eval LT 11/1  Hip flexion 4+  5   Hip extension 4+  4+   Hip abduction 5 Tested against gravity 2- 5 Tested against gravity 2-  Hip adduction 4+  4+   Hip internal rotation      Hip external rotation 4+  4+   Knee flexion 5  5   Knee extension 5  5   Ankle dorsiflexion 5  4+   Ankle plantarflexion      Ankle inversion      Ankle eversion       (Blank rows = not tested)    FUNCTIONAL TESTS:  5 times sit to stand: 16.1 sec 2 minute walk test: 3' with SPC  - Requires 2 standing rest breaks to complete  GAIT: Distance walked: 174' Assistive device utilized: Single point cane Level of assistance: Modified independence Comments: Required 2 standing rest breaks during 2MWT, demonstrates antalgic gait pattern, decreased stance time on Rt with notable Rt genuvalgus, over pronation of Lt foot, increased lateral sway. SpO2 dropped to 89-90% during ambulatory bout on RA - severe DOE, recovers in approx 2 min of seated rest. Baseline 95% (wearing nail polish)    TODAY'S TREATMENT: 02/15/22 STS 3x 10 eccentric control Squat 2x10 Heel raise 15x 3" Toe raise 15x3" Sidestep 3RT with theraband around thigh, SBA Standing hip abduction with RTB 2x10 ea Standing HS curl 2# 2x10 ea Standing hip flexion 2# 2x10 ea      02/12/22 STS 2x 10 eccentric control Squat 15x Heel raise 15x 3" Toe raise  Sidestep 3RT, 2sets with theraband around thigh Sidelying: clam with RTB 10x 5"  Hip abduction 2x 5   02/10/22 Sit: Sit to  stand x 15 Diaphragm breathing x 10  Standing:  Heel raises x 10  Hip excursions x 3 Functional squat x 15 Side stepping x 2 RT  Supine: Diaphragm breath with box on stomach x 10 Hip adduction isometric x 10  Bent knee lift  x 10  Side lying  Clam B  x 10  Hip abduction x 5      PATIENT EDUCATION:  Education details: Findings, POC, PT role, increase activity level Person educated: Patient Education method: Explanation Education comprehension: verbalized understanding   HOME EXERCISE PROGRAM:  02/12/22:  begin walking program   02/10/2022                 - Supine Diaphragmatic Breathing  - 1 x daily - 7 x weekly - 1 sets - 10 reps - 3" hold - Seated Diaphragmatic Breathing  - 2 x daily - 7 x weekly - 1 sets - 10 reps - 3" hold - Diaphragmatic Breathing to Reduce Intra-abdominal Pressure: Lifting a Basket  - 1 x daily - 7 x weekly     Sidelying Hip Abduction  - 1 x daily - 7 x weekly - 1 sets - 10 reps - 3-5" hold           10/19                 Hip excursions               - Heel Raises with Counter Support  - 2 x daily - 7 x weekly - 1 sets - 15 reps - 3" hold                - Supine Bridge  - 2 x daily - 7 x weekly - 1 sets - 15 reps - 3-5" hold  Access Code: [email or web address] URL: [email or web address] Date: 01/19/2022 Prepared by: Candie Mile  Exercises - Sit to Stand  - 2 x daily - 7 x weekly - 2 sets - 5-10 reps - Mini Squat with Counter Support  - 2 x daily - 7 x weekly - 2 sets - 5-10 reps - Standing Marching  - 2 x daily - 7 x weekly - 2 sets - 10 reps - Standing Hip Abduction with Counter Support  - 2 x daily - 7 x weekly - 2 sets - 10 reps  ASSESSMENT:  CLINICAL IMPRESSION: Patient able to ambulate from entrance to gym without w/c today prior to starting therapy session. Shows noticeable improvement in exercise tolerance. Performed resistance exercises with increased volume/load as tolerated. Denies pain. Supervision throughout but SBA  for safety with lateral stepping task. Adequately fatigued with therapeutic rest periods between exercises. Patient will continue to benefit from skilled physical therapy services to further improve independence with functional mobility.   OBJECTIVE IMPAIRMENTS Abnormal gait, cardiopulmonary status limiting activity, decreased activity tolerance, decreased balance, decreased endurance, decreased knowledge of condition, decreased knowledge of use of DME, decreased mobility, difficulty walking, decreased ROM, decreased strength, decreased safety awareness, hypomobility, and obesity.   ACTIVITY LIMITATIONS carrying, squatting, stairs, transfers, and locomotion level  PARTICIPATION LIMITATIONS: cleaning, laundry, shopping, community activity, and yard work  [place] Age, Fitness, and 1 comorbidity: obesity  are also affecting patient's functional outcome.   REHAB POTENTIAL: Good  CLINICAL DECISION MAKING: Stable/uncomplicated  EVALUATION COMPLEXITY: Low   GOALS: Goals reviewed with patient? Yes  SHORT TERM GOALS: Target date: 02/09/22  Patient will be independent with initial HEP and self-management strategies to improve functional outcomes Baseline: Initiated Goal status: IN PROGRESS   2. Patient will report attendance and tolerance at senior center group exercise class for gradual increase in activity levels for increased mobility.  Baseline: Not going but plans to  Goal status: INITIAL  LONG TERM GOALS: Target date: 03/02/22  Patient will be independent with advanced HEP and self-management strategies to improve functional outcomes Baseline:  Goal status: IN PROGRESS  2.  Patient will improve FOTO score to predicted value of 52%to indicate improvement in functional outcomes Baseline: 36% Goal status: IN PROGRESS  3.  Patient will improve 5 time sit to stand <12.6 seconds to show significant improvement in functional strength to age related norms. Baseline: 16.1  sec Goal status: IN PROGRESS  4. Patient will have equal to or > 4+/5 MMT throughout BIL LEs to improve ability to perform functional mobility, stair ambulation and ADLs.  Baseline: see above Goal status: IN PROGRESS  5. Patient will be able to ambulate at least 250 feet during 2MWT with LRAD to demonstrate improved ability to perform functional mobility and associated tasks. Baseline: 174' with SPC SpO2 89-90% on RA; 2 standing breaks to complete Goal status: IN PROGRESS    PLAN: PT FREQUENCY: 2x/week  PT DURATION: 6 weeks  PLANNED INTERVENTIONS: Therapeutic exercises, Therapeutic activity, Neuromuscular re-education, Balance training, Gait training, Patient/Family education, Self Care, Joint mobilization, Joint manipulation, Stair training, DME instructions, Dry Needling, Spinal manipulation, Spinal mobilization, Cryotherapy, Moist heat, Taping, Ultrasound, Biofeedback, Ionotophoresis '4mg'$ /ml Dexamethasone, Manual therapy, and Re-evaluation  PLAN FOR NEXT SESSION: Progress gross strength and endurance;  monitor SpO2. Gait training for symmetry and efficiency. Stair training. Aim for community exercise program for maintenance once she has completed skilled rehab.  Candie Mile, PT, DPT Physical Therapist [facility]   02/15/2022,

## 2022-02-17 ENCOUNTER — Ambulatory Visit (HOSPITAL_COMMUNITY): Payer: Medicare Other | Admitting: Physical Therapy

## 2022-02-17 DIAGNOSIS — R262 Difficulty in walking, not elsewhere classified: Secondary | ICD-10-CM

## 2022-02-17 DIAGNOSIS — R2689 Other abnormalities of gait and mobility: Secondary | ICD-10-CM | POA: Diagnosis not present

## 2022-02-17 DIAGNOSIS — M25551 Pain in right hip: Secondary | ICD-10-CM | POA: Diagnosis not present

## 2022-02-17 DIAGNOSIS — M25552 Pain in left hip: Secondary | ICD-10-CM

## 2022-02-17 NOTE — Therapy (Signed)
OUTPATIENT PHYSICAL THERAPY LOWER EXTREMITY Treatment   Patient Name: Jennifer Bennett MRN: [id number] DOB:Dec 02, 1945, 76 y.o., female Today's Date: 02/10/2022     PT End of Session - 02/17/22 1336     Visit Number 6    Number of Visits 12    Date for PT Re-Evaluation 03/02/22    Authorization Type UHC Medicare    Authorization Time Period No VL, No Auth    Progress Note Due on Visit 10    PT Start Time 1300    PT Stop Time 1340    PT Time Calculation (min) 40 min    Equipment Utilized During Treatment --   SBA/HHA   Activity Tolerance Patient tolerated treatment well    Behavior During Therapy WFL for tasks assessed/performed                Past Medical History:  Diagnosis Date   Acid reflux    Arthritis    Eczema    High blood pressure    High cholesterol    Past Surgical History:  Procedure Laterality Date   ABDOMINAL HYSTERECTOMY     ANAL FISSURECTOMY     BACK SURGERY  Lumbar   CARPAL TUNNEL RELEASE Right    COLONOSCOPY N/A 02/21/2017   Procedure: COLONOSCOPY;  Surgeon: Danie Binder, MD;  [facility];  Service: Endoscopy;  Laterality: N/A;  8:30 am - patient refused to move up at all   [place] Left 03/13/2021   Procedure: [place];  Surgeon: Carole Civil, MD;  Location: [facility];  Service: Orthopedics;  Laterality: Left;   VESICOVAGINAL FISTULA CLOSURE W/ TAH     Patient Active Problem List   Diagnosis Date Noted   Chronic constipation 03/23/2021   Hypertriglyceridemia 03/23/2021   Postoperative anemia due to acute blood loss 03/19/2021   Unspecified protein-calorie malnutrition ([place]) 03/19/2021   Osteoarthritis of left hip 03/13/2021   S/P total left hip arthroplasty 03/13/2021   Unilateral primary osteoarthritis, left hip    Special screening for malignant neoplasms, colon    Primary osteoarthritis of right knee 01/22/2014   Gout of big toe 07/24/2013   Osteoarthritis of  right knee 07/24/2013   OA (osteoarthritis) of knee 06/20/2012   Effusion of knee joint 05/16/2012   Arthritis of knee, degenerative 05/16/2012   Knee pain 05/16/2012   KNEE PAIN 06/17/2009   COUGH 02/12/2009   HIP, ARTHRITIS, DEGEN./OSTEO 12/06/2007   OBESITY, TRUNCAL 04/18/2007   TOBACCO USER 04/18/2007   HYPERTENSION 04/18/2007   ALLERGIC RHINITIS 04/18/2007   PULMONARY NODULE 04/18/2007   DYSPNEA 04/18/2007    PCP: Laural Roes , MD  REFERRING PROVIDER: Iona Beard, MD   REFERRING DIAG: PT eval/tx for pain in rt hip   THERAPY DIAG:  Difficulty in walking, not elsewhere classified  Pain in right hip  Other abnormalities of gait and mobility  Pain in left hip  Rationale for Evaluation and Treatment Rehabilitation  ONSET DATE: April 2023  SUBJECTIVE:   SUBJECTIVE STATEMENT:  Pt states that she just does not feel good today, it is not her hip she just feels blah.   PERTINENT HISTORY: See EMR  PAIN:  Are you having pain? No  PRECAUTIONS: None  WEIGHT BEARING RESTRICTIONS No  FALLS:  Has patient fallen in last 6 months? 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  PLOF: Independent  PATIENT GOALS Improve ability to walk, go shopping.   OBJECTIVE:   DIAGNOSTIC FINDINGS: FINDINGS: Assessment is limited by patient body habitus. Bilateral hip arthroplasties are again noted. No definite acute fracture or dislocation is identified. There is asymmetric heterotopic ossification adjacent to the proximal left femur. Prior lumbar fusion is noted.   IMPRESSION: No acute osseous abnormality identified.    PATIENT SURVEYS:  FOTO 36%  COGNITION:  Overall cognitive status: Within functional  limits for tasks assessed     SENSATION: WFL   LOWER EXTREMITY ROM:  Active ROM Right eval Left eval  Hip flexion    Hip extension    Hip abduction    Hip adduction    Hip internal rotation 3 14  Hip external rotation 24 31  Knee flexion    Knee extension    Ankle dorsiflexion    Ankle plantarflexion    Ankle inversion    Ankle eversion     (Blank rows = not tested)  LOWER EXTREMITY MMT:  MMT Right eval Right 11/1 Left eval LT 11/1  Hip flexion 4+  5   Hip extension 4+  4+   Hip abduction 5 Tested against gravity 2- 5 Tested against gravity 2-  Hip adduction 4+  4+   Hip internal rotation      Hip external rotation 4+  4+   Knee flexion 5  5   Knee extension 5  5   Ankle dorsiflexion 5  4+   Ankle plantarflexion      Ankle inversion      Ankle eversion       (Blank rows = not tested)    FUNCTIONAL TESTS:  5 times sit to stand: 16.1 sec 2 minute walk test: 20' with SPC  - Requires 2 standing rest breaks to complete  GAIT: Distance walked: 174' Assistive device utilized: Single point cane Level of assistance: Modified independence Comments: Required 2 standing rest breaks during 2MWT, demonstrates antalgic gait pattern, decreased stance time on Rt with notable Rt genuvalgus, over pronation of Lt foot, increased lateral sway. SpO2 dropped to 89-90% during ambulatory bout on RA - severe DOE, recovers in approx 2 min of seated rest. Baseline 95% (wearing nail polish)    TODAY'S TREATMENT: 02/17/22 Sit to stand x 15 Standing" 4" step up  Forward step x 15 B Marching x 10  Side step with green t-band x 3 RT  Squat with heel raise x 10 Wall arch x 5 due to Rt shoulder pain  Semi tandem stance x 5 B with head turns   02/15/22 STS 3x 10 eccentric control Squat 2x10 Heel raise 15x 3" Toe raise 15x3" Sidestep 3RT with theraband around thigh, SBA Standing hip abduction with RTB 2x10 ea Standing HS curl 2# 2x10 ea Standing hip flexion 2# 2x10  ea      02/12/22 STS 2x 10 eccentric control Squat 15x Heel raise 15x 3" Toe raise  Sidestep 3RT, 2sets with theraband around thigh Sidelying: clam with RTB 10x 5"  Hip abduction 2x 5   02/10/22 Sit: Sit to stand x 15 Diaphragm breathing x 10  Standing:  Heel raises x 10  Hip excursions x 3 Functional squat x 15 Side stepping x 2 RT  Supine: Diaphragm breath with box on stomach x  10 Hip adduction isometric x 10  Bent knee lift  x 10  Side lying  Clam B  x 10  Hip abduction x 5      PATIENT EDUCATION:  Education details: Findings, POC, PT role, increase activity level Person educated: Patient Education method: Explanation Education comprehension: verbalized understanding   HOME EXERCISE PROGRAM:              02/12/22:  begin walking program   02/10/2022                 - Supine Diaphragmatic Breathing  - 1 x daily - 7 x weekly - 1 sets - 10 reps - 3" hold - Seated Diaphragmatic Breathing  - 2 x daily - 7 x weekly - 1 sets - 10 reps - 3" hold - Diaphragmatic Breathing to Reduce Intra-abdominal Pressure: Lifting a Basket  - 1 x daily - 7 x weekly     Sidelying Hip Abduction  - 1 x daily - 7 x weekly - 1 sets - 10 reps - 3-5" hold           10/19                 Hip excursions               - Heel Raises with Counter Support  - 2 x daily - 7 x weekly - 1 sets - 15 reps - 3" hold                - Supine Bridge  - 2 x daily - 7 x weekly - 1 sets - 15 reps - 3-5" hold  Access Code: [email or web address] URL: [email or web address] Date: 01/19/2022 Prepared by: Candie Mile  Exercises - Sit to Stand  - 2 x daily - 7 x weekly - 2 sets - 5-10 reps - Mini Squat with Counter Support  - 2 x daily - 7 x weekly - 2 sets - 5-10 reps - Standing Marching  - 2 x daily - 7 x weekly - 2 sets - 10 reps - Standing Hip Abduction with Counter Support  - 2 x daily - 7 x weekly - 2 sets - 10 reps  ASSESSMENT:  CLINICAL IMPRESSION: Pt not feeling well today, therefore she  needed to take small rest breaks in between most exercises.  Therapist added balance activity to pt exercises this session which were difficult.  Pt will continue to benefit from skilled therapy to strengthen core and LE as well as improve her balance in order to improve her overall safety.   OBJECTIVE IMPAIRMENTS Abnormal gait, cardiopulmonary status limiting activity, decreased activity tolerance, decreased balance, decreased endurance, decreased knowledge of condition, decreased knowledge of use of DME, decreased mobility, difficulty walking, decreased ROM, decreased strength, decreased safety awareness, hypomobility, and obesity.   ACTIVITY LIMITATIONS carrying, squatting, stairs, transfers, and locomotion level  PARTICIPATION LIMITATIONS: cleaning, laundry, shopping, community activity, and yard work  [place] Age, Fitness, and 1 comorbidity: obesity  are also affecting patient's functional outcome.   REHAB POTENTIAL: Good  CLINICAL DECISION MAKING: Stable/uncomplicated  EVALUATION COMPLEXITY: Low   GOALS: Goals reviewed with patient? Yes  SHORT TERM GOALS: Target date: 02/09/22  Patient will be independent with initial HEP and self-management strategies to improve functional outcomes Baseline: Initiated Goal status: IN PROGRESS   2. Patient will report attendance and tolerance at senior center group exercise class for gradual increase in activity levels for increased mobility.  Baseline: Not  going but plans to  Goal status: INITIAL  LONG TERM GOALS: Target date: 03/02/22  Patient will be independent with advanced HEP and self-management strategies to improve functional outcomes Baseline:  Goal status: IN PROGRESS  2.  Patient will improve FOTO score to predicted value of 52%to indicate improvement in functional outcomes Baseline: 36% Goal status: IN PROGRESS  3.  Patient will improve 5 time sit to stand <12.6 seconds to show significant improvement in functional  strength to age related norms. Baseline: 16.1 sec Goal status: IN PROGRESS  4. Patient will have equal to or > 4+/5 MMT throughout BIL LEs to improve ability to perform functional mobility, stair ambulation and ADLs.  Baseline: see above Goal status: IN PROGRESS  5. Patient will be able to ambulate at least 250 feet during 2MWT with LRAD to demonstrate improved ability to perform functional mobility and associated tasks. Baseline: 174' with SPC SpO2 89-90% on RA; 2 standing breaks to complete Goal status: IN PROGRESS    PLAN: PT FREQUENCY: 2x/week  PT DURATION: 6 weeks  PLANNED INTERVENTIONS: Therapeutic exercises, Therapeutic activity, Neuromuscular re-education, Balance training, Gait training, Patient/Family education, Self Care, Joint mobilization, Joint manipulation, Stair training, DME instructions, Dry Needling, Spinal manipulation, Spinal mobilization, Cryotherapy, Moist heat, Taping, Ultrasound, Biofeedback, Ionotophoresis '4mg'$ /ml Dexamethasone, Manual therapy, and Re-evaluation  PLAN FOR NEXT SESSION: Progress gross strength and endurance;  monitor SpO2. Gait training for symmetry and efficiency. Stair training. Aim for community exercise program for maintenance once she has completed skilled rehab.  Rayetta Humphrey, [place] CLT [phone]  02/17/2022, 1340

## 2022-02-22 ENCOUNTER — Encounter (HOSPITAL_COMMUNITY): Payer: Self-pay | Admitting: Physical Therapy

## 2022-02-22 ENCOUNTER — Ambulatory Visit (HOSPITAL_COMMUNITY): Payer: Medicare Other | Admitting: Physical Therapy

## 2022-02-22 DIAGNOSIS — M25551 Pain in right hip: Secondary | ICD-10-CM | POA: Diagnosis not present

## 2022-02-22 DIAGNOSIS — R2689 Other abnormalities of gait and mobility: Secondary | ICD-10-CM | POA: Diagnosis not present

## 2022-02-22 DIAGNOSIS — R262 Difficulty in walking, not elsewhere classified: Secondary | ICD-10-CM | POA: Diagnosis not present

## 2022-02-22 DIAGNOSIS — M25552 Pain in left hip: Secondary | ICD-10-CM | POA: Diagnosis not present

## 2022-02-22 NOTE — Therapy (Signed)
OUTPATIENT PHYSICAL THERAPY LOWER EXTREMITY Treatment   Patient Name: Jennifer Bennett MRN: [id number] DOB:10/09/1945, 76 y.o., female Today's Date: 02/10/2022     PT End of Session - 02/22/22 1435     Visit Number 7    Number of Visits 12    Date for PT Re-Evaluation 03/02/22    Authorization Type [employer] Medicare    Authorization Time Period No VL, No Auth    Progress Note Due on Visit 10    PT Start Time 5456    PT Stop Time 1520    PT Time Calculation (min) 46 min    Equipment Utilized During Treatment --   SBA/HHA   Activity Tolerance Patient tolerated treatment well    Behavior During Therapy WFL for tasks assessed/performed                Past Medical History:  Diagnosis Date   Acid reflux    Arthritis    Eczema    High blood pressure    High cholesterol    Past Surgical History:  Procedure Laterality Date   ABDOMINAL HYSTERECTOMY     ANAL FISSURECTOMY     BACK SURGERY  Lumbar   CARPAL TUNNEL RELEASE Right    COLONOSCOPY N/A 02/21/2017   Procedure: COLONOSCOPY;  Surgeon: Danie Binder, MD;  [facility];  Service: Endoscopy;  Laterality: N/A;  8:30 am - patient refused to move up at all   [place] Left 03/13/2021   Procedure: [place];  Surgeon: Carole Civil, MD;  Location: [facility];  Service: Orthopedics;  Laterality: Left;   VESICOVAGINAL FISTULA CLOSURE W/ TAH     Patient Active Problem List   Diagnosis Date Noted   Chronic constipation 03/23/2021   Hypertriglyceridemia 03/23/2021   Postoperative anemia due to acute blood loss 03/19/2021   Unspecified protein-calorie malnutrition ([place]) 03/19/2021   Osteoarthritis of left hip 03/13/2021   S/P total left hip arthroplasty 03/13/2021   Unilateral primary osteoarthritis, left hip    Special screening for malignant neoplasms, colon    Primary osteoarthritis of right knee 01/22/2014   Gout of big toe 07/24/2013   Osteoarthritis of  right knee 07/24/2013   OA (osteoarthritis) of knee 06/20/2012   Effusion of knee joint 05/16/2012   Arthritis of knee, degenerative 05/16/2012   Knee pain 05/16/2012   KNEE PAIN 06/17/2009   COUGH 02/12/2009   HIP, ARTHRITIS, DEGEN./OSTEO 12/06/2007   OBESITY, TRUNCAL 04/18/2007   TOBACCO USER 04/18/2007   HYPERTENSION 04/18/2007   ALLERGIC RHINITIS 04/18/2007   PULMONARY NODULE 04/18/2007   DYSPNEA 04/18/2007    PCP: Laural Roes , MD  REFERRING PROVIDER: Iona Beard, MD   REFERRING DIAG: PT eval/tx for pain in rt hip   THERAPY DIAG:  Difficulty in walking, not elsewhere classified  Pain in right hip  Other abnormalities of gait and mobility  Rationale for Evaluation and Treatment Rehabilitation  ONSET DATE: April 2023  SUBJECTIVE:   SUBJECTIVE STATEMENT:  Patient states she has been walking more, notices improvement in symptoms. Felt her breathing was difficult outside yesterday. Very busy over the weekend, cooking for a party.   PERTINENT HISTORY: See EMR  PAIN:  Are you having pain? No  PRECAUTIONS: None  WEIGHT BEARING RESTRICTIONS No  FALLS:  Has patient fallen in last 6 months? 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  PLOF: Independent  PATIENT GOALS Improve ability to walk, go shopping.   OBJECTIVE:   DIAGNOSTIC FINDINGS: FINDINGS: Assessment is limited by patient body habitus. Bilateral hip arthroplasties are again noted. No definite acute fracture or dislocation is identified. There is asymmetric heterotopic ossification adjacent to the proximal left femur. Prior lumbar fusion is noted.   IMPRESSION: No acute osseous abnormality identified.    PATIENT SURVEYS:  FOTO  36%  COGNITION:  Overall cognitive status: Within functional limits for tasks assessed     SENSATION: WFL   LOWER EXTREMITY ROM:  Active ROM Right eval Left eval  Hip flexion    Hip extension    Hip abduction    Hip adduction    Hip internal rotation 3 14  Hip external rotation 24 31  Knee flexion    Knee extension    Ankle dorsiflexion    Ankle plantarflexion    Ankle inversion    Ankle eversion     (Blank rows = not tested)  LOWER EXTREMITY MMT:  MMT Right eval Right 11/1 Left eval LT 11/1  Hip flexion 4+  5   Hip extension 4+  4+   Hip abduction 5 Tested against gravity 2- 5 Tested against gravity 2-  Hip adduction 4+  4+   Hip internal rotation      Hip external rotation 4+  4+   Knee flexion 5  5   Knee extension 5  5   Ankle dorsiflexion 5  4+   Ankle plantarflexion      Ankle inversion      Ankle eversion       (Blank rows = not tested)    FUNCTIONAL TESTS:  5 times sit to stand: 16.1 sec 2 minute walk test: 73' with SPC  - Requires 2 standing rest breaks to complete  GAIT: Distance walked: 174' Assistive device utilized: Single point cane Level of assistance: Modified independence Comments: Required 2 standing rest breaks during 2MWT, demonstrates antalgic gait pattern, decreased stance time on Rt with notable Rt genuvalgus, over pronation of Lt foot, increased lateral sway. SpO2 dropped to 89-90% during ambulatory bout on RA - severe DOE, recovers in approx 2 min of seated rest. Baseline 95% (wearing nail polish)    TODAY'S TREATMENT: 02/22/22 Sit to stand 3x10 6" step up: Forward step x 10 B Lateral step x10 B Mountain climber, hands on wall x30 alternating feet Side step with green t-band x 3 RT  Squat with heel raise x 10   02/17/22 Sit to stand x 15 Standing" 4" step up  Forward step x 15 B Marching x 10  Side step with green t-band x 3 RT  Squat with heel raise x 10 Wall arch x 5 due to Rt shoulder pain  Semi tandem  stance x 5 B with head turns   02/15/22 STS 3x 10 eccentric control Squat 2x10 Heel raise 15x 3" Toe raise 15x3" Sidestep 3RT with theraband around thigh, SBA Standing hip abduction with RTB 2x10 ea Standing HS curl 2# 2x10 ea Standing hip flexion 2# 2x10 ea       PATIENT EDUCATION:  Education details: Findings, POC, PT role, increase activity level Person educated: Patient Education method: Explanation Education comprehension: verbalized understanding   HOME EXERCISE PROGRAM:  02/12/22:  begin walking program   02/10/2022                 - Supine Diaphragmatic Breathing  - 1 x daily - 7 x weekly - 1 sets - 10 reps - 3" hold - Seated Diaphragmatic Breathing  - 2 x daily - 7 x weekly - 1 sets - 10 reps - 3" hold - Diaphragmatic Breathing to Reduce Intra-abdominal Pressure: Lifting a Basket  - 1 x daily - 7 x weekly     Sidelying Hip Abduction  - 1 x daily - 7 x weekly - 1 sets - 10 reps - 3-5" hold           10/19                 Hip excursions               - Heel Raises with Counter Support  - 2 x daily - 7 x weekly - 1 sets - 15 reps - 3" hold                - Supine Bridge  - 2 x daily - 7 x weekly - 1 sets - 15 reps - 3-5" hold  Access Code: [email or web address] URL: [email or web address] Date: 01/19/2022 Prepared by: Candie Mile  Exercises - Sit to Stand  - 2 x daily - 7 x weekly - 2 sets - 5-10 reps - Mini Squat with Counter Support  - 2 x daily - 7 x weekly - 2 sets - 5-10 reps - Standing Marching  - 2 x daily - 7 x weekly - 2 sets - 10 reps - Standing Hip Abduction with Counter Support  - 2 x daily - 7 x weekly - 2 sets - 10 reps  ASSESSMENT:  CLINICAL IMPRESSION: Tolerated all exercises well. Progressed volume, and load, higher step today, tolerated well with adjusted volume for adaptation. Denies pain with exercises. Provided several therapeutic rest breaks as needed. Patient will continue to benefit from skilled physical therapy services to  further improve independence with functional mobility.    OBJECTIVE IMPAIRMENTS Abnormal gait, cardiopulmonary status limiting activity, decreased activity tolerance, decreased balance, decreased endurance, decreased knowledge of condition, decreased knowledge of use of DME, decreased mobility, difficulty walking, decreased ROM, decreased strength, decreased safety awareness, hypomobility, and obesity.   ACTIVITY LIMITATIONS carrying, squatting, stairs, transfers, and locomotion level  PARTICIPATION LIMITATIONS: cleaning, laundry, shopping, community activity, and yard work  [place] Age, Fitness, and 1 comorbidity: obesity  are also affecting patient's functional outcome.   REHAB POTENTIAL: Good  CLINICAL DECISION MAKING: Stable/uncomplicated  EVALUATION COMPLEXITY: Low   GOALS: Goals reviewed with patient? Yes  SHORT TERM GOALS: Target date: 02/09/22  Patient will be independent with initial HEP and self-management strategies to improve functional outcomes Baseline: Initiated Goal status: IN PROGRESS   2. Patient will report attendance and tolerance at senior center group exercise class for gradual increase in activity levels for increased mobility.  Baseline: Not going but plans to  Goal status: INITIAL  LONG TERM GOALS: Target date: 03/02/22  Patient will be independent with advanced HEP and self-management strategies to improve functional outcomes Baseline:  Goal status: IN PROGRESS  2.  Patient will improve FOTO score to predicted value of 52%to indicate improvement in functional outcomes Baseline: 36% Goal status: IN PROGRESS  3.  Patient will improve 5 time sit to stand <12.6 seconds to show significant improvement in functional strength to age  related norms. Baseline: 16.1 sec Goal status: IN PROGRESS  4. Patient will have equal to or > 4+/5 MMT throughout BIL LEs to improve ability to perform functional mobility, stair ambulation and ADLs.  Baseline:  see above Goal status: IN PROGRESS  5. Patient will be able to ambulate at least 250 feet during 2MWT with LRAD to demonstrate improved ability to perform functional mobility and associated tasks. Baseline: 174' with SPC SpO2 89-90% on RA; 2 standing breaks to complete Goal status: IN PROGRESS    PLAN: PT FREQUENCY: 2x/week  PT DURATION: 6 weeks  PLANNED INTERVENTIONS: Therapeutic exercises, Therapeutic activity, Neuromuscular re-education, Balance training, Gait training, Patient/Family education, Self Care, Joint mobilization, Joint manipulation, Stair training, DME instructions, Dry Needling, Spinal manipulation, Spinal mobilization, Cryotherapy, Moist heat, Taping, Ultrasound, Biofeedback, Ionotophoresis '4mg'$ /ml Dexamethasone, Manual therapy, and Re-evaluation  PLAN FOR NEXT SESSION: Progress gross strength and endurance;  monitor SpO2. Gait training for symmetry and efficiency. Stair training. Aim for community exercise program for maintenance once she has completed skilled rehab.  Candie Mile, PT, DPT Physical Therapist Acute Rehabilitation Services [facility]

## 2022-02-25 ENCOUNTER — Ambulatory Visit (HOSPITAL_COMMUNITY): Payer: Medicare Other | Admitting: Physical Therapy

## 2022-02-25 DIAGNOSIS — M25551 Pain in right hip: Secondary | ICD-10-CM

## 2022-02-25 DIAGNOSIS — R2689 Other abnormalities of gait and mobility: Secondary | ICD-10-CM

## 2022-02-25 DIAGNOSIS — R262 Difficulty in walking, not elsewhere classified: Secondary | ICD-10-CM

## 2022-02-25 DIAGNOSIS — M25552 Pain in left hip: Secondary | ICD-10-CM

## 2022-02-25 NOTE — Therapy (Signed)
OUTPATIENT PHYSICAL THERAPY LOWER EXTREMITY Treatment   Patient Name: Jennifer Bennett MRN: [id number] DOB:[birth year], 76 y.o., female Today's Date: 02/10/2022     PT End of Session - 02/25/22 1346    Visit Number 8    Number of Visits 12    Date for PT Re-Evaluation 03/02/22    Authorization Type UHC Medicare    Authorization Time Period No VL, No Auth    Progress Note Due on Visit 10    PT Start Time 1305    PT Stop Time 1345    PT Time Calculation (min) 40 min    Equipment Utilized During Treatment --   SBA/HHA   Activity Tolerance Patient tolerated treatment well    Behavior During Therapy WFL for tasks assessed/performed                Past Medical History:  Diagnosis Date   Acid reflux    Arthritis    Eczema    High blood pressure    High cholesterol    Past Surgical History:  Procedure Laterality Date   ABDOMINAL HYSTERECTOMY     ANAL FISSURECTOMY     BACK SURGERY  Lumbar   CARPAL TUNNEL RELEASE Right    COLONOSCOPY N/A 02/21/2017   Procedure: COLONOSCOPY;  Surgeon: Danie Binder, MD;  [facility];  Service: Endoscopy;  Laterality: N/A;  8:30 am - patient refused to move up at all   [place] Left 03/13/2021   Procedure: [place];  Surgeon: Carole Civil, MD;  Location: [facility];  Service: Orthopedics;  Laterality: Left;   VESICOVAGINAL FISTULA CLOSURE W/ TAH     Patient Active Problem List   Diagnosis Date Noted   Chronic constipation 03/23/2021   Hypertriglyceridemia 03/23/2021   Postoperative anemia due to acute blood loss 03/19/2021   Unspecified protein-calorie malnutrition ([place]) 03/19/2021   Osteoarthritis of left hip 03/13/2021   S/P total left hip arthroplasty 03/13/2021   Unilateral primary osteoarthritis, left hip    Special screening for malignant neoplasms, colon    Primary osteoarthritis of right knee 01/22/2014   Gout of big toe 07/24/2013   Osteoarthritis of  right knee 07/24/2013   OA (osteoarthritis) of knee 06/20/2012   Effusion of knee joint 05/16/2012   Arthritis of knee, degenerative 05/16/2012   Knee pain 05/16/2012   KNEE PAIN 06/17/2009   COUGH 02/12/2009   HIP, ARTHRITIS, DEGEN./OSTEO 12/06/2007   OBESITY, TRUNCAL 04/18/2007   TOBACCO USER 04/18/2007   HYPERTENSION 04/18/2007   ALLERGIC RHINITIS 04/18/2007   PULMONARY NODULE 04/18/2007   DYSPNEA 04/18/2007    PCP: Laural Roes , MD  REFERRING PROVIDER: Iona Beard, MD   REFERRING DIAG: PT eval/tx for pain in rt hip   THERAPY DIAG:  Difficulty in walking, not elsewhere classified  Pain in right hip  Pain in left hip  Other abnormalities of gait and mobility  Rationale for Evaluation and Treatment Rehabilitation  ONSET DATE: April 2023  SUBJECTIVE:   SUBJECTIVE STATEMENT:  Pt states she does not have a lot of pain she has more stiffness.  Pt states that her insurance is going to cover her dues to the [facility].  PERTINENT HISTORY: See EMR  PAIN:  Are you having pain? No  LIVING ENVIRONMENT: Lives with: lives alone Lives in: House/apartment Stairs: Yes: External: 4 steps; on right going up, on left going up, and can reach both Has following equipment at home: Single  point cane and Walker - 2 wheeled    PATIENT GOALS Improve ability to walk, go shopping.   OBJECTIVE:   DIAGNOSTIC FINDINGS: FINDINGS: Assessment is limited by patient body habitus. Bilateral hip arthroplasties are again noted. No definite acute fracture or dislocation is identified. There is asymmetric heterotopic ossification adjacent to the proximal left femur. Prior lumbar fusion is noted.   IMPRESSION: No acute osseous abnormality identified.    PATIENT SURVEYS:  FOTO 36%   LOWER EXTREMITY ROM:  Active ROM Right eval Left eval  Hip flexion    Hip extension    Hip abduction    Hip adduction    Hip internal rotation 3 14  Hip external rotation 24 31  Knee flexion     Knee extension    Ankle dorsiflexion    Ankle plantarflexion    Ankle inversion    Ankle eversion     (Blank rows = not tested)  LOWER EXTREMITY MMT:  MMT Right eval Right 11/1 Left eval LT 11/1  Hip flexion 4+  5   Hip extension 4+  4+   Hip abduction 5 Tested against gravity 2- 5 Tested against gravity 2-  Hip adduction 4+  4+   Hip internal rotation      Hip external rotation 4+  4+   Knee flexion 5  5   Knee extension 5  5   Ankle dorsiflexion 5  4+   Ankle plantarflexion      Ankle inversion      Ankle eversion       (Blank rows = not tested)    FUNCTIONAL TESTS:  5 times sit to stand: 16.1 sec 2 minute walk test: 31' with SPC  - Requires 2 standing rest breaks to complete  GAIT: Distance walked: 174' Assistive device utilized: Single point cane Level of assistance: Modified independence Comments: Required 2 standing rest breaks during 2MWT, demonstrates antalgic gait pattern, decreased stance time on Rt with notable Rt genuvalgus, over pronation of Lt foot, increased lateral sway. SpO2 dropped to 89-90% during ambulatory bout on RA - severe DOE, recovers in approx 2 min of seated rest. Baseline 95% (wearing nail polish)    TODAY'S TREATMENT: 02/25/22 Rockerboard 2' Step up 6" B Lateral step up 6"x B  Side step x 3 RT with green t band Hip extension with green t-band x 10 Hip ab with green theraband x 10  Hip excursion x 3  Sit to stand x 10  02/22/22 Sit to stand 3x10 6" step up: Forward step x 10 B Lateral step x10 B Mountain climber, hands on wall x30 alternating feet Side step with green t-band x 3 RT  Squat with heel raise x 10   02/17/22 Sit to stand x 15 Standing" 4" step up  Forward step x 15 B Marching x 10  Side step with green t-band x 3 RT  Squat with heel raise x 10 Wall arch x 5 due to Rt shoulder pain  Semi tandem stance x 5 B with head turns         PATIENT EDUCATION:  Education details: Findings, POC, PT role,  increase activity level Person educated: Patient Education method: Explanation Education comprehension: verbalized understanding   HOME EXERCISE PROGRAM:              02/12/22:  begin walking program   02/10/2022                 - Supine Diaphragmatic Breathing  -  1 x daily - 7 x weekly - 1 sets - 10 reps - 3" hold - Seated Diaphragmatic Breathing  - 2 x daily - 7 x weekly - 1 sets - 10 reps - 3" hold - Diaphragmatic Breathing to Reduce Intra-abdominal Pressure: Lifting a Basket  - 1 x daily - 7 x weekly     Sidelying Hip Abduction  - 1 x daily - 7 x weekly - 1 sets - 10 reps - 3-5" hold           10/19                 Hip excursions               - Heel Raises with Counter Support  - 2 x daily - 7 x weekly - 1 sets - 15 reps - 3" hold                - Supine Bridge  - 2 x daily - 7 x weekly - 1 sets - 15 reps - 3-5" hold  Access Code: [email or web address] URL: [email or web address] Date: 01/19/2022 Prepared by: Candie Mile  Exercises - Sit to Stand  - 2 x daily - 7 x weekly - 2 sets - 5-10 reps - Mini Squat with Counter Support  - 2 x daily - 7 x weekly - 2 sets - 5-10 reps - Standing Marching  - 2 x daily - 7 x weekly - 2 sets - 10 reps - Standing Hip Abduction with Counter Support  - 2 x daily - 7 x weekly - 2 sets - 10 reps  ASSESSMENT:  CLINICAL IMPRESSION: Pt  is more aware of when she properly completes exercises vs not, most complaint is of her Rt knee now not her hip.  Added standing t band strengthening while standing. . Provided several therapeutic rest breaks as needed. Patient will continue to benefit from skilled physical therapy services to further improve independence with functional mobility.    OBJECTIVE IMPAIRMENTS Abnormal gait, cardiopulmonary status limiting activity, decreased activity tolerance, decreased balance, decreased endurance, decreased knowledge of condition, decreased knowledge of use of DME, decreased mobility, difficulty walking, decreased  ROM, decreased strength, decreased safety awareness, hypomobility, and obesity.   ACTIVITY LIMITATIONS carrying, squatting, stairs, transfers, and locomotion level  PARTICIPATION LIMITATIONS: cleaning, laundry, shopping, community activity, and yard work  [place] Age, Fitness, and 1 comorbidity: obesity  are also affecting patient's functional outcome.   REHAB POTENTIAL: Good  CLINICAL DECISION MAKING: Stable/uncomplicated  EVALUATION COMPLEXITY: Low   GOALS: Goals reviewed with patient? Yes  SHORT TERM GOALS: Target date: 02/09/22  Patient will be independent with initial HEP and self-management strategies to improve functional outcomes Baseline: Initiated Goal status: IN PROGRESS   2. Patient will report attendance and tolerance at senior center group exercise class for gradual increase in activity levels for increased mobility.  Baseline: Not going but plans to  Goal status: INITIAL  LONG TERM GOALS: Target date: 03/02/22  Patient will be independent with advanced HEP and self-management strategies to improve functional outcomes Baseline:  Goal status: IN PROGRESS  2.  Patient will improve FOTO score to predicted value of 52%to indicate improvement in functional outcomes Baseline: 36% Goal status: IN PROGRESS  3.  Patient will improve 5 time sit to stand <12.6 seconds to show significant improvement in functional strength to age related norms. Baseline: 16.1 sec Goal status: IN PROGRESS  4. Patient will have equal to or > 4+/5  MMT throughout BIL LEs to improve ability to perform functional mobility, stair ambulation and ADLs.  Baseline: see above Goal status: IN PROGRESS  5. Patient will be able to ambulate at least 250 feet during 2MWT with LRAD to demonstrate improved ability to perform functional mobility and associated tasks. Baseline: 174' with SPC SpO2 89-90% on RA; 2 standing breaks to complete Goal status: IN PROGRESS    PLAN: PT FREQUENCY:  2x/week  PT DURATION: 6 weeks  PLANNED INTERVENTIONS: Therapeutic exercises, Therapeutic activity, Neuromuscular re-education, Balance training, Gait training, Patient/Family education, Self Care, Joint mobilization, Joint manipulation, Stair training, DME instructions, Dry Needling, Spinal manipulation, Spinal mobilization, Cryotherapy, Moist heat, Taping, Ultrasound, Biofeedback, Ionotophoresis '4mg'$ /ml Dexamethasone, Manual therapy, and Re-evaluation  PLAN FOR NEXT SESSION:  re assess.  Progress gross strength and endurance;  [place] Gait training for symmetry and efficiency. Stair training. Aim for community exercise program for maintenance once she has completed skilled rehab.   Rayetta Humphrey, [place] [phone]  [phone]

## 2022-03-01 ENCOUNTER — Encounter (HOSPITAL_COMMUNITY): Payer: Medicare Other | Admitting: Physical Therapy

## 2022-03-03 ENCOUNTER — Encounter (HOSPITAL_COMMUNITY): Payer: Medicare Other | Admitting: Physical Therapy

## 2022-03-09 ENCOUNTER — Ambulatory Visit (HOSPITAL_COMMUNITY): Payer: Medicare Other | Admitting: Physical Therapy

## 2022-03-09 DIAGNOSIS — M25551 Pain in right hip: Secondary | ICD-10-CM | POA: Diagnosis not present

## 2022-03-09 DIAGNOSIS — R2689 Other abnormalities of gait and mobility: Secondary | ICD-10-CM

## 2022-03-09 DIAGNOSIS — R262 Difficulty in walking, not elsewhere classified: Secondary | ICD-10-CM

## 2022-03-09 DIAGNOSIS — M25552 Pain in left hip: Secondary | ICD-10-CM | POA: Diagnosis not present

## 2022-03-09 NOTE — Therapy (Signed)
OUTPATIENT PHYSICAL THERAPY LOWER EXTREMITY Treatment/discharge    Patient Name: Jennifer Bennett MRN: [id number] DOB:June 08, 1945, 76 y.o., female Today's Date: 03/09/2022  PHYSICAL THERAPY DISCHARGE SUMMARY  Visits from Start of Care: 9  Current functional level related to goals / functional outcomes: See below   Remaining deficits: Endurance and strength but pt admits to not completing HEP   Education / Equipment: HEP   Patient agrees to discharge. Patient goals were partially met. Patient is being discharged due to  Pt states that her main concern at this point is her Rt knee pain not her hip.  She continues to have weakness in her hip but is not doing her exercises at home.  Therapist explained that if she wants to meet maximal functional potential she must complete her HEP .      PT End of Session - 03/09/22 1615    Visit Number 9   Number of Visits 9    Date for PT Re-Evaluation 03/02/22    Authorization Type [employer] Medicare    Authorization Time Period No VL, No Auth    Progress Note Due on Visit 9   PT Start Time 1520   PT Stop Time 1600   PT Time Calculation (min) 40 min    Equipment Utilized During Treatment Quad cane   Activity Tolerance Patient tolerated treatment well    Behavior During Therapy WFL for tasks assessed/performed                Past Medical History:  Diagnosis Date   Acid reflux    Arthritis    Eczema    High blood pressure    High cholesterol    Past Surgical History:  Procedure Laterality Date   ABDOMINAL HYSTERECTOMY     ANAL FISSURECTOMY     BACK SURGERY  Lumbar   CARPAL TUNNEL RELEASE Right    COLONOSCOPY N/A 02/21/2017   Procedure: COLONOSCOPY;  Surgeon: Danie Binder, MD;  [facility];  Service: Endoscopy;  Laterality: N/A;  8:30 am - patient refused to move up at all   [place] Left 03/13/2021   Procedure: [place];  Surgeon: Carole Civil, MD;   Location: [facility];  Service: Orthopedics;  Laterality: Left;   VESICOVAGINAL FISTULA CLOSURE W/ TAH     Patient Active Problem List   Diagnosis Date Noted   Chronic constipation 03/23/2021   Hypertriglyceridemia 03/23/2021   Postoperative anemia due to acute blood loss 03/19/2021   Unspecified protein-calorie malnutrition ([place]) 03/19/2021   Osteoarthritis of left hip 03/13/2021   S/P total left hip arthroplasty 03/13/2021   Unilateral primary osteoarthritis, left hip    Special screening for malignant neoplasms, colon    Primary osteoarthritis of right knee 01/22/2014   Gout of big toe 07/24/2013   Osteoarthritis of right knee 07/24/2013   OA (osteoarthritis) of knee 06/20/2012   Effusion of knee joint 05/16/2012   Arthritis of knee, degenerative 05/16/2012   Knee pain 05/16/2012   KNEE PAIN 06/17/2009   COUGH 02/12/2009   HIP, ARTHRITIS, DEGEN./OSTEO 12/06/2007   OBESITY, TRUNCAL 04/18/2007   TOBACCO USER 04/18/2007   HYPERTENSION 04/18/2007   ALLERGIC RHINITIS 04/18/2007   PULMONARY NODULE 04/18/2007   DYSPNEA 04/18/2007    PCP: Laural Roes , MD  REFERRING PROVIDER: Iona Beard, MD   REFERRING DIAG: PT eval/tx for pain in rt hip   THERAPY DIAG:  Difficulty in walking, not elsewhere classified  Pain in right hip  Pain in left hip  Other abnormalities of gait and mobility  Rationale for Evaluation and Treatment Rehabilitation  ONSET DATE: April 2023  SUBJECTIVE:   SUBJECTIVE STATEMENT:  Pt states she does not have a lot of pain she has more stiffness.  She has joined the [employer].  Pt states that her hip does not bother her but she woke up today and had significant knee pain as well as SOB   PERTINENT HISTORY: See EMR  PAIN:  Are you having pain? No  LIVING ENVIRONMENT: Lives with: lives alone Lives in: House/apartment Stairs: Yes: External: 4 steps; on right going up, on left going up, and can reach both Has following equipment at home: Single point cane  and Walker - 2 wheeled    PATIENT GOALS Improve ability to walk, go shopping.   OBJECTIVE:   DIAGNOSTIC FINDINGS: FINDINGS: Assessment is limited by patient body habitus. Bilateral hip arthroplasties are again noted. No definite acute fracture or dislocation is identified. There is asymmetric heterotopic ossification adjacent to the proximal left femur. Prior lumbar fusion is noted.   IMPRESSION: No acute osseous abnormality identified.    PATIENT SURVEYS:  10/10 FOTO 36%  11/28 53%   LOWER EXTREMITY ROM:  Active ROM Right eval Left eval  Hip flexion    Hip extension    Hip abduction    Hip adduction    Hip internal rotation 3 14  Hip external rotation 24 31  Knee flexion    Knee extension    Ankle dorsiflexion    Ankle plantarflexion    Ankle inversion    Ankle eversion     (Blank rows = not tested)  LOWER EXTREMITY MMT:  MMT Right eval Right 11/1 Right 11/28 Left eval LT 11/1 Lt  11/28   Hip flexion 4+  _0 Hip extension 4+  3+ 4+  4+  Hip abduction 5 Tested against gravity 2- 3- 5 Tested against gravity 2- 3  Hip adduction 4+   4+    Hip internal rotation        Hip external rotation 4+   4+    Knee flexion 5   5    Knee extension 5   5    Ankle dorsiflexion 5   4+    Ankle plantarflexion        Ankle inversion        Ankle eversion         (Blank rows = not tested)    FUNCTIONAL TESTS:  5 times sit to stand: 16.1 sec 2 minute walk test: 174' with SPC  - Requires 2 standing rest breaks to complete                       11/28:  5 x sit to stand                                    2 minute walk: 236 ft. After a minute pt had to stop due to SOB pt unable to recover after 2 minute was over. Noted audible wheezing     TODAY'S TREATMENT: 11/28:  Reassessment Gt Sit to stand x 10  Side lying hip abduction B x 1o Prone hip extension x 5  02/25/22 Rockerboard 2' Step up 6" B Lateral step up 6"x B  Side step x 3  RT with green t  band Hip extension with green t-band x 10 Hip ab with green theraband x 10  Hip excursion x 3  Sit to stand x 10  02/22/22 Sit to stand 3x10 6" step up: Forward step x 10 B Lateral step x10 B Mountain climber, hands on wall x30 alternating feet Side step with green t-band x 3 RT  Squat with heel raise x 10   02/17/22 Sit to stand x 15 Standing" 4" step up  Forward step x 15 B Marching x 10  Side step with green t-band x 3 RT  Squat with heel raise x 10 Wall arch x 5 due to Rt shoulder pain  Semi tandem stance x 5 B with head turns         PATIENT EDUCATION:  Education details: Findings, POC, PT role, increase activity level Person educated: Patient Education method: Explanation Education comprehension: verbalized understanding   HOME EXERCISE PROGRAM:              02/12/22:  begin walking program   02/10/2022                 - Supine Diaphragmatic Breathing  - 1 x daily - 7 x weekly - 1 sets - 10 reps - 3" hold - Seated Diaphragmatic Breathing  - 2 x daily - 7 x weekly - 1 sets - 10 reps - 3" hold - Diaphragmatic Breathing to Reduce Intra-abdominal Pressure: Lifting a Basket  - 1 x daily - 7 x weekly     Sidelying Hip Abduction  - 1 x daily - 7 x weekly - 1 sets - 10 reps - 3-5" hold           10/19                 Hip excursions               - Heel Raises with Counter Support  - 2 x daily - 7 x weekly - 1 sets - 15 reps - 3" hold                - Supine Bridge  - 2 x daily - 7 x weekly - 1 sets - 15 reps - 3-5" hold  Access Code: [email or web address] URL: [email or web address] Date: 01/19/2022 Prepared by: Candie Mile  Exercises - Sit to Stand  - 2 x daily - 7 x weekly - 2 sets - 5-10 reps - Mini Squat with Counter Support  - 2 x daily - 7 x weekly - 2 sets - 5-10 reps - Standing Marching  - 2 x daily - 7 x weekly - 2 sets - 10 reps - Standing Hip Abduction with Counter Support  - 2 x daily - 7 x weekly - 2 sets - 10 reps  ASSESSMENT:  CLINICAL  IMPRESSION:  Pt states that she has no hip pain.  Her main concern is her stamina.  Therapist explained that the only thing that will help her stamina is to work her stamina, therapy will not be able to help with this as she does not come to therapy often enough to affect her stamina.  PT will be discharged to the gym for HEP at this time.     OBJECTIVE IMPAIRMENTS Abnormal gait, cardiopulmonary status limiting activity, decreased activity tolerance, decreased balance, decreased endurance, decreased knowledge of condition, decreased knowledge of use of DME, decreased mobility, difficulty walking, decreased ROM, decreased strength, decreased safety awareness, hypomobility,  and obesity.   ACTIVITY LIMITATIONS carrying, squatting, stairs, transfers, and locomotion level  PARTICIPATION LIMITATIONS: cleaning, laundry, shopping, community activity, and yard work  [place] Age, Fitness, and 1 comorbidity: obesity  are also affecting patient's functional outcome.   REHAB POTENTIAL: Good  CLINICAL DECISION MAKING: Stable/uncomplicated  EVALUATION COMPLEXITY: Low   GOALS: Goals reviewed with patient? Yes  SHORT TERM GOALS: Target date: 02/09/22  Patient will be independent with initial HEP and self-management strategies to improve functional outcomes Baseline: Initiated Goal status: IN PROGRESS   2. Patient will report attendance and tolerance at senior center group exercise class for gradual increase in activity levels for increased mobility.  Baseline: Not going but plans to  Goal status: met  LONG TERM GOALS: Target date: 03/02/22  Patient will be independent with advanced HEP and self-management strategies to improve functional outcomes Baseline:  Goal status: IN PROGRESS  2.  Patient will improve FOTO score to predicted value of 52%to indicate improvement in functional outcomes Baseline: 36% Goal status: met   3.  Patient will improve 5 time sit to stand <12.6 seconds to  show significant improvement in functional strength to age related norms. Baseline: 16.1 sec Goal status: IN PROGRESS  4. Patient will have equal to or > 4+/5 MMT throughout BIL LEs to improve ability to perform functional mobility, stair ambulation and ADLs.  Baseline: see above Goal status: IN PROGRESS  5. Patient will be able to ambulate at least 250 feet during 2MWT with LRAD to demonstrate improved ability to perform functional mobility and associated tasks. Baseline: 174' with SPC SpO2 89-90% on RA; 2 standing breaks to complete Goal status: IN PROGRESS    PLAN: PT FREQUENCY: 2x/week  PT DURATION: 6 weeks  PLANNED INTERVENTIONS: Therapeutic exercises, Therapeutic activity, Neuromuscular re-education, Balance training, Gait training, Patient/Family education, Self Care, Joint mobilization, Joint manipulation, Stair training, DME instructions, Dry Needling, Spinal manipulation, Spinal mobilization, Cryotherapy, Moist heat, Taping, Ultrasound, Biofeedback, Ionotophoresis 48m/ml Dexamethasone, Manual therapy, and Re-evaluation  PLAN FOR NEXT SESSION:  re assess.  Progress gross strength and endurance;  .Marland KitchenGait training for symmetry and efficiency. Stair training. Aim for community exercise program for maintenance once she has completed skilled rehab.   CRayetta Humphrey PSouth [phone]

## 2022-03-18 ENCOUNTER — Ambulatory Visit (INDEPENDENT_AMBULATORY_CARE_PROVIDER_SITE_OTHER): Payer: Medicare Other

## 2022-03-18 ENCOUNTER — Ambulatory Visit (INDEPENDENT_AMBULATORY_CARE_PROVIDER_SITE_OTHER): Payer: Medicare Other | Admitting: Orthopedic Surgery

## 2022-03-18 DIAGNOSIS — M1612 Unilateral primary osteoarthritis, left hip: Secondary | ICD-10-CM | POA: Diagnosis not present

## 2022-03-18 DIAGNOSIS — Z96641 Presence of right artificial hip joint: Secondary | ICD-10-CM | POA: Diagnosis not present

## 2022-03-18 DIAGNOSIS — Z96642 Presence of left artificial hip joint: Secondary | ICD-10-CM | POA: Diagnosis not present

## 2022-03-18 NOTE — Progress Notes (Signed)
Chief Complaint  Patient presents with   Follow-up    Recheck on left hip, DOS 03-13-21   1 year follow-up left total hip with bone graft  Patient also has a right hip replacement and also has a  Lower lumbar fusion  Preevaluation imaging was obtained.  The hip is stable and the implant looks good the bone graft incorporated.  There is no hardware mall alignment or malfunction cup angle looks good as well  She reports no problems with either hip she does report some right knee discomfort.  She is using a cane but she is otherwise independently ambulatory gets out of a chair easily  Everything looks good right now we will see her in a year repeat x-rays both hips

## 2022-05-10 DIAGNOSIS — E785 Hyperlipidemia, unspecified: Secondary | ICD-10-CM | POA: Diagnosis not present

## 2022-05-10 DIAGNOSIS — I10 Essential (primary) hypertension: Secondary | ICD-10-CM | POA: Diagnosis not present

## 2022-05-10 DIAGNOSIS — R0602 Shortness of breath: Secondary | ICD-10-CM | POA: Diagnosis not present

## 2022-05-24 DIAGNOSIS — I10 Essential (primary) hypertension: Secondary | ICD-10-CM | POA: Diagnosis not present

## 2022-05-24 DIAGNOSIS — R7303 Prediabetes: Secondary | ICD-10-CM | POA: Diagnosis not present

## 2022-05-24 DIAGNOSIS — R0602 Shortness of breath: Secondary | ICD-10-CM | POA: Diagnosis not present

## 2022-06-10 ENCOUNTER — Encounter: Payer: Self-pay | Admitting: Radiology

## 2022-07-20 DIAGNOSIS — I10 Essential (primary) hypertension: Secondary | ICD-10-CM | POA: Diagnosis not present

## 2022-07-20 DIAGNOSIS — R0602 Shortness of breath: Secondary | ICD-10-CM | POA: Diagnosis not present

## 2022-07-20 DIAGNOSIS — E785 Hyperlipidemia, unspecified: Secondary | ICD-10-CM | POA: Diagnosis not present

## 2022-07-20 DIAGNOSIS — M150 Primary generalized (osteo)arthritis: Secondary | ICD-10-CM | POA: Diagnosis not present

## 2022-07-21 ENCOUNTER — Ambulatory Visit: Payer: Medicare Other | Attending: Internal Medicine | Admitting: Internal Medicine

## 2022-07-21 ENCOUNTER — Encounter: Payer: Self-pay | Admitting: Internal Medicine

## 2022-07-21 VITALS — BP 148/86 | HR 51 | Ht 61.5 in | Wt 243.0 lb

## 2022-07-21 DIAGNOSIS — R0609 Other forms of dyspnea: Secondary | ICD-10-CM | POA: Diagnosis not present

## 2022-07-21 DIAGNOSIS — R0602 Shortness of breath: Secondary | ICD-10-CM

## 2022-07-21 NOTE — Patient Instructions (Signed)
Medication Instructions:  Your physician recommends that you continue on your current medications as directed. Please refer to the Current Medication list given to you today.  *If you need a refill on your cardiac medications before your next appointment, please call your pharmacy*   Lab Work: None If you have labs (blood work) drawn today and your tests are completely normal, you will receive your results only by: MyChart Message (if you have MyChart) OR A paper copy in the mail If you have any lab test that is abnormal or we need to change your treatment, we will call you to review the results.   Testing/Procedures: Your physician has requested that you have an echocardiogram. Echocardiography is a painless test that uses sound waves to create images of your heart. It provides your doctor with information about the size and shape of your heart and how well your heart's chambers and valves are working. This procedure takes approximately one hour. There are no restrictions for this procedure. Please do NOT wear cologne, perfume, aftershave, or lotions (deodorant is allowed). Please arrive 15 minutes prior to your appointment time.  Your physician has requested that you have a lexiscan myoview. For further information please visit [email or web address]. Please follow instruction sheet, as given.    Follow-Up: At [facility], you and your health needs are our priority.  As part of our continuing mission to provide you with exceptional heart care, we have created designated Provider Care Teams.  These Care Teams include your primary Cardiologist (physician) and Advanced Practice Providers (APPs -  Physician Assistants and Nurse Practitioners) who all work together to provide you with the care you need, when you need it.  We recommend signing up for the patient portal called "MyChart".  Sign up information is provided on this After Visit Summary.  MyChart is used to connect with  patients for Virtual Visits (Telemedicine).  Patients are able to view lab/test results, encounter notes, upcoming appointments, etc.  Non-urgent messages can be sent to your provider as well.   To learn more about what you can do with MyChart, go to [email or web address].    Your next appointment:   1 year(s)  Provider:   Vishnu Mallipeddi, MD    Other Instructions      

## 2022-07-21 NOTE — Progress Notes (Signed)
Cardiology Office Note  Date: 07/21/2022   ID: [place], [place]baugh February 23, 1946, MRN [id number]  PCP:  Mirna Mires, MD  Cardiologist:  Marjo Bicker, MD Electrophysiologist:  None   Reason for Office Visit: DOE evaluation at the request of Dr. Loleta Chance   History of Present Illness: Jennifer Bennett is a 77 y.o. female known to have HTN, HLD was referred to cardiology clinic for evaluation of DOE.  Patient reported having DOE for the last 2 years which she noticed has been worsening recently. She also has severe knee and hip arthritis, hence not able to exercise.  She is scared of water and hence does not prefer any water activities. Denies any leg swelling or angina.  Denies dizziness, lightheadedness, syncope, palpitations. She was tested for OSA many years ago and was told she did not have it. Denies smoking status, licit drug abuse and alcohol use. No family history of premature ASCVD. She says she eats a lot, likes food and cannot help it. She said she knows to lose weight to help with her DOE but cannot help the fact that she likes food a lot.  Past Medical History:  Diagnosis Date   Acid reflux    Arthritis    Eczema    High blood pressure    High cholesterol     Past Surgical History:  Procedure Laterality Date   ABDOMINAL HYSTERECTOMY     ANAL FISSURECTOMY     BACK SURGERY  Lumbar   CARPAL TUNNEL RELEASE Right    COLONOSCOPY N/A 02/21/2017   Procedure: COLONOSCOPY;  Surgeon: [place] Bali, MD;  [facility];  Service: Endoscopy;  Laterality: N/A;  8:30 am - patient refused to move up at all   HIP SURGERY Right    TOTAL HIP ARTHROPLASTY Left 03/13/2021   Procedure: TOTAL HIP ARTHROPLASTY;  Surgeon: Vickki Hearing, MD;  Location: [facility];  Service: Orthopedics;  Laterality: Left;   VESICOVAGINAL FISTULA CLOSURE W/ TAH      Current Outpatient Medications  Medication Sig Dispense Refill   acetaminophen (TYLENOL) 650 MG CR tablet Take  650 mg by mouth every 8 (eight) hours as needed for pain.     allopurinol (ZYLOPRIM) 100 MG tablet Take 1 tablet (100 mg total) by mouth daily. 30 tablet 0   aspirin EC 81 MG tablet Take 81 mg daily by mouth.     betamethasone dipropionate 0.05 % cream Apply 1 application topically 2 (two) times daily as needed (eczema). 30 g 0   Fluticasone-Umeclidin-Vilant (TRELEGY ELLIPTA IN) Inhale into the lungs.     gemfibrozil (LOPID) 600 MG tablet Take 1 tablet (600 mg total) by mouth daily. 30 tablet 0   hydrochlorothiazide (HYDRODIURIL) 25 MG tablet Take 1 tablet (25 mg total) by mouth daily. 30 tablet 0   metoprolol tartrate (LOPRESSOR) 50 MG tablet Take 50 mg by mouth daily.     polyethylene glycol (MIRALAX / GLYCOLAX) 17 g packet Take 17 g by mouth daily. 14 each 0   Tetrahyd-Glyc-Hypro-PEG-ZnSulf (VISINE TOTALITY MULTI-SYMPTOM OP) Apply 1 drop 3 (three) times daily as needed to eye (for dry/irritated eyes.).     oxyCODONE-acetaminophen (PERCOCET/ROXICET) 5-325 MG tablet Take 1 tablet by mouth every 6 (six) hours as needed for severe pain. (Patient not taking: Reported on 07/21/2022) 20 tablet 0   No current facility-administered medications for this visit.   Allergies:  Ace inhibitors and Angiotensin receptor blockers   Social History: The patient  reports that  she quit smoking about 8 years ago. Her smoking use included cigarettes. She has a 26.00 pack-year smoking history. She has never used smokeless tobacco. She reports that she does not drink alcohol and does not use drugs.   Family History: The patient's family history includes Arthritis in an other family member; Asthma in an other family member; Cancer in her brother and another family member; Heart disease in her father.   ROS:  Please see the history of present illness. Otherwise, complete review of systems is positive for none.  All other systems are reviewed and negative.   Physical Exam: VS:  BP (!) 148/86   Pulse (!) 51   Ht 5'  1.5" (1.562 m)   Wt 243 lb (110.2 kg)   SpO2 95%   BMI 45.17 kg/m , BMI Body mass index is 45.17 kg/m.  Wt Readings from Last 3 Encounters:  07/21/22 243 lb (110.2 kg)  10/14/21 230 lb (104.3 kg)  03/31/21 227 lb 9.6 oz (103.2 kg)    General: Patient appears comfortable at rest. HEENT: Conjunctiva and lids normal, oropharynx clear with moist mucosa. Neck: Supple, no elevated JVP or carotid bruits, no thyromegaly. Lungs: Clear to auscultation, nonlabored breathing at rest. Cardiac: Regular rate and rhythm, no S3 or significant systolic murmur, no pericardial rub. Abdomen: Soft, nontender, no hepatomegaly, bowel sounds present, no guarding or rebound. Extremities: No pitting edema, distal pulses 2+. Skin: Warm and dry. Musculoskeletal: No kyphosis. Neuropsychiatric: Alert and oriented x3, affect grossly appropriate.  ECG: Sinus bradycardia  Recent Labwork: No results found for requested labs within last 365 days.  No results found for: "CHOL", "TRIG", "HDL", "CHOLHDL", "VLDL", "LDLCALC", "LDLDIRECT"   Assessment and Plan: Patient is 77 year old F known to have HTN, HLD was referred to cardiology clinic for eval for DOE.  # DOE, multifactorial: Likely secondary to deconditioning however due to risk factors of HTN and age group of 44s, CAD cannot be ruled out. Obtain Lexiscan and 2D echocardiogram. In the meantime, instructed patient to control diet and start exercising.  She voiced understanding but I do not think she is willing to follow strict dietary regimen. # HTN: Continue metoprolol tartrate 50 mg and HCTZ 25 mg once daily, HTN per PCP # HLD: I do not have lipid values to review, has been on gemfibrozil 6 mg once daily for almost 20 years.  HLD per PCP.  I have spent a total of 45 minutes with patient reviewing chart, EKGs, labs and examining patient as well as establishing an assessment and plan that was discussed with the patient.  > 50% of time was spent in direct patient  care.     Medication Adjustments/Labs and Tests Ordered: Current medicines are reviewed at length with the patient today.  Concerns regarding medicines are outlined above.   Tests Ordered: Orders Placed This Encounter  Procedures   NM Myocar Multi W/Spect W/Wall Motion / EF   EKG 12-Lead   ECHOCARDIOGRAM COMPLETE    Medication Changes: No orders of the defined types were placed in this encounter.   Disposition:  Follow up  1 year  Signed, Khamil Lamica Verne Spurr, MD, 07/21/2022 12:03 PM     [facility] [address], [place], [place] [address]

## 2022-08-04 ENCOUNTER — Ambulatory Visit (HOSPITAL_COMMUNITY)
Admission: RE | Admit: 2022-08-04 | Discharge: 2022-08-04 | Disposition: A | Discharge status: Home / Self Care | Payer: Medicare Other | Source: Ambulatory Visit | Attending: Internal Medicine | Admitting: Internal Medicine

## 2022-08-04 ENCOUNTER — Encounter (HOSPITAL_COMMUNITY): Payer: Self-pay

## 2022-08-04 ENCOUNTER — Encounter (HOSPITAL_BASED_OUTPATIENT_CLINIC_OR_DEPARTMENT_OTHER)
Admission: RE | Admit: 2022-08-04 | Discharge: 2022-08-04 | Disposition: A | Discharge status: Home / Self Care | Payer: Medicare Other | Source: Ambulatory Visit | Attending: Internal Medicine | Admitting: Internal Medicine

## 2022-08-04 DIAGNOSIS — R0609 Other forms of dyspnea: Secondary | ICD-10-CM | POA: Insufficient documentation

## 2022-08-04 HISTORY — DX: Unspecified asthma, uncomplicated: J45.909

## 2022-08-04 LAB — NM MYOCAR MULTI W/SPECT W/WALL MOTION / EF
Base ST Depression (mm): 0 mm
LV dias vol: 70 mL (ref 46–106)
LV sys vol: 18 mL
Nuc Stress EF: 74 %
Peak HR: 81 {beats}/min
RATE: 0.5
Rest HR: 59 {beats}/min
Rest Nuclear Isotope Dose: 9.9 mCi
SDS: 2
SRS: 1
SSS: 3
ST Depression (mm): 0 mm
Stress Nuclear Isotope Dose: 33 mCi
TID: 1.13

## 2022-08-04 MED ORDER — REGADENOSON 0.4 MG/5ML IV SOLN
INTRAVENOUS | Status: AC
  Administered 2022-08-04: 0.4 mg
  Filled 2022-08-04: qty 5

## 2022-08-04 MED ORDER — TECHNETIUM TC 99M TETROFOSMIN IV KIT
10.0000 | PACK | Freq: Once | INTRAVENOUS | Status: AC | PRN
  Administered 2022-08-04: 9.9 via INTRAVENOUS

## 2022-08-04 MED ORDER — TECHNETIUM TC 99M TETROFOSMIN IV KIT
30.0000 | PACK | Freq: Once | INTRAVENOUS | Status: AC | PRN
  Administered 2022-08-04: 33 via INTRAVENOUS

## 2022-08-04 MED ORDER — SODIUM CHLORIDE FLUSH 0.9 % IV SOLN
INTRAVENOUS | Status: AC
  Administered 2022-08-04: 10 mL via INTRAVENOUS
  Filled 2022-08-04: qty 10

## 2022-08-09 ENCOUNTER — Telehealth: Payer: Self-pay | Admitting: Internal Medicine

## 2022-08-09 NOTE — Telephone Encounter (Signed)
-----   Message from Marjo Bicker, MD sent at 08/05/2022  2:02 PM EDT ----- Stress test is abnormal. Test showed it could be a blockage in one of the heart vessels but this could be an artifact as well. Study is low risk. I would not make any changes to the current medications. Strongly encourage diet and exercise. Return to follow-up in 1 year.

## 2022-08-09 NOTE — Telephone Encounter (Signed)
Pt returning call for stress test results  

## 2022-08-09 NOTE — Telephone Encounter (Signed)
Patient notified and verbalized understanding. Patient had no questions or concerns at this time. PCP copied 

## 2022-08-20 ENCOUNTER — Ambulatory Visit (HOSPITAL_COMMUNITY)
Admission: RE | Admit: 2022-08-20 | Discharge: 2022-08-20 | Disposition: A | Discharge status: Home / Self Care | Payer: Medicare Other | Source: Ambulatory Visit | Attending: Internal Medicine | Admitting: Internal Medicine

## 2022-08-20 DIAGNOSIS — R0609 Other forms of dyspnea: Secondary | ICD-10-CM

## 2022-08-20 LAB — ECHOCARDIOGRAM COMPLETE
AR max vel: 3.23 cm2
AV Area VTI: 2.92 cm2
AV Area mean vel: 3.07 cm2
AV Mean grad: 7.7 mmHg
AV Peak grad: 14.7 mmHg
Ao pk vel: 1.92 m/s
Area-P 1/2: 3.31 cm2
Calc EF: 71.5 %
S' Lateral: 3.8 cm
Single Plane A2C EF: 73.5 %
Single Plane A4C EF: 70.5 %

## 2022-08-20 NOTE — Progress Notes (Signed)
  Echocardiogram 2D Echocardiogram has been performed.  Jennifer Bennett 08/20/2022, 12:21 PM

## 2022-08-25 ENCOUNTER — Telehealth: Payer: Self-pay | Admitting: Internal Medicine

## 2022-08-25 MED ORDER — POTASSIUM CHLORIDE CRYS ER 20 MEQ PO TBCR: 20.0000 meq | EXTENDED_RELEASE_TABLET | Freq: Every day | ORAL | 3 refills | Status: AC

## 2022-08-25 MED ORDER — FUROSEMIDE 40 MG PO TABS: 40.0000 mg | ORAL_TABLET | Freq: Every day | ORAL | 3 refills | Status: AC

## 2022-08-25 NOTE — Telephone Encounter (Signed)
-----   Message from Marjo Bicker, MD sent at 08/24/2022  2:53 PM EDT ----- Normal pumping function of the heart but there is some stiffness of the heart muscle. Start p.o. Lasix 40 mg once daily and KCl 20 mEq once daily, discontinue HCTZ.

## 2022-08-25 NOTE — Telephone Encounter (Signed)
Echo results discussed with patient. She will stop HCTZ and begin Lasix 40 mg qd and Potassium 20 meq daily. She asked that 90 day supply be sent to [facility]

## 2022-08-25 NOTE — Telephone Encounter (Signed)
Patient returning call for echo results. 

## 2022-10-19 DIAGNOSIS — I10 Essential (primary) hypertension: Secondary | ICD-10-CM | POA: Diagnosis not present

## 2022-10-19 DIAGNOSIS — E782 Mixed hyperlipidemia: Secondary | ICD-10-CM | POA: Diagnosis not present

## 2022-10-19 DIAGNOSIS — R001 Bradycardia, unspecified: Secondary | ICD-10-CM | POA: Diagnosis not present

## 2022-10-19 DIAGNOSIS — R0602 Shortness of breath: Secondary | ICD-10-CM | POA: Diagnosis not present

## 2022-10-19 DIAGNOSIS — M150 Primary generalized (osteo)arthritis: Secondary | ICD-10-CM | POA: Diagnosis not present

## 2023-01-18 DIAGNOSIS — Z0000 Encounter for general adult medical examination without abnormal findings: Secondary | ICD-10-CM | POA: Diagnosis not present

## 2023-01-18 DIAGNOSIS — M1711 Unilateral primary osteoarthritis, right knee: Secondary | ICD-10-CM | POA: Diagnosis not present

## 2023-01-18 DIAGNOSIS — R0602 Shortness of breath: Secondary | ICD-10-CM | POA: Diagnosis not present

## 2023-01-18 DIAGNOSIS — R001 Bradycardia, unspecified: Secondary | ICD-10-CM | POA: Diagnosis not present

## 2023-01-18 DIAGNOSIS — I10 Essential (primary) hypertension: Secondary | ICD-10-CM | POA: Diagnosis not present

## 2023-01-18 DIAGNOSIS — E782 Mixed hyperlipidemia: Secondary | ICD-10-CM | POA: Diagnosis not present

## 2023-03-24 ENCOUNTER — Ambulatory Visit: Payer: Medicare Other | Admitting: Orthopedic Surgery

## 2023-03-24 DIAGNOSIS — Z96642 Presence of left artificial hip joint: Secondary | ICD-10-CM

## 2023-04-11 ENCOUNTER — Encounter: Payer: Self-pay | Admitting: Orthopedic Surgery

## 2023-04-11 ENCOUNTER — Other Ambulatory Visit (INDEPENDENT_AMBULATORY_CARE_PROVIDER_SITE_OTHER): Payer: Medicare Other

## 2023-04-11 ENCOUNTER — Ambulatory Visit: Payer: Medicare Other | Admitting: Orthopedic Surgery

## 2023-04-11 DIAGNOSIS — Z96641 Presence of right artificial hip joint: Secondary | ICD-10-CM

## 2023-04-11 DIAGNOSIS — Z96642 Presence of left artificial hip joint: Secondary | ICD-10-CM | POA: Diagnosis not present

## 2023-04-11 DIAGNOSIS — M160 Bilateral primary osteoarthritis of hip: Secondary | ICD-10-CM

## 2023-04-11 MED ORDER — MELOXICAM 7.5 MG PO TABS
ORAL_TABLET | ORAL | 5 refills | Status: AC
Start: 2023-04-11 — End: ?

## 2023-04-11 NOTE — Progress Notes (Signed)
Right hip 2005  Chief Complaint  Patient presents with   Follow-up    Recheck on left hip, DOS 03/13/21.   Encounter Diagnoses  Name Primary?   S/P total left hip arthroplasty 03/13/21 Yes   Bilateral hip joint arthritis    H/O total hip arthroplasty, right 2005    Annual follow-up for bilateral total hips the most recent 1 in 2022  She is doing well with a cane she also had a lumbar fusion  X-rays today show that the hips are in good position there is no signs of wear or loosening  She is ambulatory with a cane doing well she has some other arthritic aches and we decided to try meloxicam 1 every other day her last kidney function tests were normal by the records here.  Follow-up in a year repeat imaging  Meds ordered this encounter  Medications   meloxicam (MOBIC) 7.5 MG tablet    Sig: Take one every other day    Dispense:  30 tablet    Refill:  5

## 2023-04-26 DIAGNOSIS — M150 Primary generalized (osteo)arthritis: Secondary | ICD-10-CM | POA: Diagnosis not present

## 2023-04-26 DIAGNOSIS — E7800 Pure hypercholesterolemia, unspecified: Secondary | ICD-10-CM | POA: Diagnosis not present

## 2023-04-26 DIAGNOSIS — R001 Bradycardia, unspecified: Secondary | ICD-10-CM | POA: Diagnosis not present

## 2023-04-26 DIAGNOSIS — I10 Essential (primary) hypertension: Secondary | ICD-10-CM | POA: Diagnosis not present

## 2023-07-25 DIAGNOSIS — R001 Bradycardia, unspecified: Secondary | ICD-10-CM | POA: Diagnosis not present

## 2023-07-25 DIAGNOSIS — E7800 Pure hypercholesterolemia, unspecified: Secondary | ICD-10-CM | POA: Diagnosis not present

## 2023-07-25 DIAGNOSIS — I10 Essential (primary) hypertension: Secondary | ICD-10-CM | POA: Diagnosis not present

## 2023-07-25 DIAGNOSIS — M150 Primary generalized (osteo)arthritis: Secondary | ICD-10-CM | POA: Diagnosis not present

## 2023-07-25 DIAGNOSIS — R0602 Shortness of breath: Secondary | ICD-10-CM | POA: Diagnosis not present

## 2023-08-29 DIAGNOSIS — H04123 Dry eye syndrome of bilateral lacrimal glands: Secondary | ICD-10-CM | POA: Diagnosis not present

## 2023-10-11 ENCOUNTER — Other Ambulatory Visit (HOSPITAL_COMMUNITY): Payer: Self-pay | Admitting: Family Medicine

## 2023-10-11 DIAGNOSIS — M150 Primary generalized (osteo)arthritis: Secondary | ICD-10-CM | POA: Diagnosis not present

## 2023-10-11 DIAGNOSIS — R001 Bradycardia, unspecified: Secondary | ICD-10-CM | POA: Diagnosis not present

## 2023-10-11 DIAGNOSIS — E782 Mixed hyperlipidemia: Secondary | ICD-10-CM | POA: Diagnosis not present

## 2023-10-11 DIAGNOSIS — M25512 Pain in left shoulder: Secondary | ICD-10-CM | POA: Diagnosis not present

## 2023-10-11 DIAGNOSIS — E7800 Pure hypercholesterolemia, unspecified: Secondary | ICD-10-CM | POA: Diagnosis not present

## 2023-10-11 DIAGNOSIS — I10 Essential (primary) hypertension: Secondary | ICD-10-CM | POA: Diagnosis not present

## 2023-10-11 DIAGNOSIS — Z87891 Personal history of nicotine dependence: Secondary | ICD-10-CM

## 2023-10-24 ENCOUNTER — Ambulatory Visit (HOSPITAL_COMMUNITY)
Admission: RE | Admit: 2023-10-24 | Discharge: 2023-10-24 | Disposition: A | Discharge status: Home / Self Care | Source: Ambulatory Visit | Attending: Family Medicine | Admitting: Family Medicine

## 2023-10-24 DIAGNOSIS — Z87891 Personal history of nicotine dependence: Secondary | ICD-10-CM | POA: Insufficient documentation

## 2023-10-26 ENCOUNTER — Ambulatory Visit: Attending: Internal Medicine | Admitting: Internal Medicine

## 2023-10-26 VITALS — BP 128/80 | HR 61 | Ht 62.0 in | Wt 234.0 lb

## 2023-10-26 DIAGNOSIS — R0609 Other forms of dyspnea: Secondary | ICD-10-CM | POA: Diagnosis not present

## 2023-10-26 DIAGNOSIS — R0602 Shortness of breath: Secondary | ICD-10-CM

## 2023-10-26 DIAGNOSIS — Z79899 Other long term (current) drug therapy: Secondary | ICD-10-CM | POA: Diagnosis not present

## 2023-10-26 DIAGNOSIS — I5032 Chronic diastolic (congestive) heart failure: Secondary | ICD-10-CM | POA: Insufficient documentation

## 2023-10-26 MED ORDER — DAPAGLIFLOZIN PROPANEDIOL 10 MG PO TABS: 10.0000 mg | ORAL_TABLET | Freq: Every day | ORAL | 3 refills | Status: DC

## 2023-10-26 NOTE — Patient Instructions (Signed)
 Medication Instructions:  Your physician recommends the following medication changes.  START TAKING: Farxiga  10 mg daily  *If you need a refill on your cardiac medications before your next appointment, please call your pharmacy*  Lab Work: Your provider would like for you to return in 5 days to have the following labs drawn: BMP.   Please go to inside [facility], [address] [place], [place], [place] [address] You do not need an appointment.  They are open from 8 am- 3:30 pm.  You DO NOT need to be fasting.   If you have labs (blood work) drawn today and your tests are completely normal, you will receive your results only by: MyChart Message (if you have MyChart) OR A paper copy in the mail If you have any lab test that is abnormal or we need to change your treatment, we will call you to review the results.  Follow-Up: At [facility], you and your health needs are our priority.  As part of our continuing mission to provide you with exceptional heart care, our providers are all part of one team.  This team includes your primary Cardiologist (physician) and Advanced Practice Providers or APPs (Physician Assistants and Nurse Practitioners) who all work together to provide you with the care you need, when you need it.  Your next appointment:   6 month(s)  Provider:   You may see Vishnu P Mallipeddi, MD or one of the following Advanced Practice Providers on your designated Care Team:   [place], PA-C  [place], PA-C Olivia Pavy, PA-C

## 2023-10-26 NOTE — Progress Notes (Signed)
 Cardiology Office Note  Date: 10/26/2023   ID: Jennifer Bennett [birth year], MRN [id number]  PCP:  Leigh Lung, MD  Cardiologist:  Diannah SHAUNNA Maywood, MD Electrophysiologist:  None   Reason for Office Visit: DOE evaluation at the request of Dr. Leigh   History of Present Illness: Jennifer Bennett is a 78 y.o. female known to have HTN, HLD is here for follow-up visit.  Initially referred to cardiology clinic for evaluation of DOE x 2 years.  Stable with no recent worsening.  Echocardiogram from May 2024 showed normal LVEF, G2 DD, normal RV function, no valvular heart disease and CVP 3 mmHg.  NM stress test showed breast attenuation artifact versus ischemia in the apical and anterior wall.  She was started on p.o. Lasix  40 mg once daily but due to frequent urination, switched to p.o. Lasix  40 mg every other day per instructions of her PCP.  She denies having any angina ever.  No dizziness, syncope, leg swelling or palpitations.  She reported that her lipid panel recently showed elevated cholesterol levels but her triglyceride levels are normal.  She knows that she has to fix her diet.  Past Medical History:  Diagnosis Date   Acid reflux    Arthritis    Asthma    Eczema    High blood pressure    High cholesterol     Past Surgical History:  Procedure Laterality Date   ABDOMINAL HYSTERECTOMY     ANAL FISSURECTOMY     BACK SURGERY  Lumbar   CARPAL TUNNEL RELEASE Right    COLONOSCOPY N/A 02/21/2017   Procedure: COLONOSCOPY;  Surgeon: Harvey Margo CROME, MD;  Location: [facility];  Service: Endoscopy;  Laterality: N/A;  8:30 am - patient refused to move up at all   HIP SURGERY Right    TOTAL HIP ARTHROPLASTY Left 03/13/2021   Procedure: TOTAL HIP ARTHROPLASTY;  Surgeon: Margrette Taft BRAVO, MD;  Location: [facility] ORS;  Service: Orthopedics;  Laterality: Left;   VESICOVAGINAL FISTULA CLOSURE W/ TAH      Current Outpatient Medications  Medication Sig Dispense Refill    acetaminophen  (TYLENOL ) 650 MG CR tablet Take 650 mg by mouth every 8 (eight) hours as needed for pain.     allopurinol  (ZYLOPRIM ) 100 MG tablet Take 1 tablet (100 mg total) by mouth daily. 30 tablet 0   aspirin  EC 81 MG tablet Take 81 mg daily by mouth.     betamethasone  dipropionate 0.05 % cream Apply 1 application topically 2 (two) times daily as needed (eczema). 30 g 0   Fluticasone-Umeclidin-Vilant (TRELEGY ELLIPTA IN) Inhale into the lungs.     furosemide  (LASIX ) 40 MG tablet Take 1 tablet (40 mg total) by mouth daily. 90 tablet 3   gemfibrozil  (LOPID ) 600 MG tablet Take 1 tablet (600 mg total) by mouth daily. 30 tablet 0   meloxicam  (MOBIC ) 7.5 MG tablet Take one every other day 30 tablet 5   metoprolol  tartrate (LOPRESSOR ) 50 MG tablet Take 50 mg by mouth daily.     polyethylene glycol (MIRALAX  / GLYCOLAX ) 17 g packet Take 17 g by mouth daily. (Patient not taking: Reported on 10/26/2023) 14 each 0   potassium chloride  SA (KLOR-CON  M20) 20 MEQ tablet Take 1 tablet (20 mEq total) by mouth daily. 90 tablet 3   Tetrahyd-Glyc-Hypro-PEG-ZnSulf (VISINE TOTALITY MULTI-SYMPTOM OP) Apply 1 drop 3 (three) times daily as needed to eye (for dry/irritated eyes.).     No current facility-administered medications for this  visit.   Allergies:  Ace inhibitors and Angiotensin receptor blockers   Social History: The patient  reports that she quit smoking about 9 years ago. Her smoking use included cigarettes. She started smoking about 61 years ago. She has a 26 pack-year smoking history. She has never used smokeless tobacco. She reports that she does not drink alcohol  and does not use drugs.   Family History: The patient's family history includes Arthritis in an other family member; Asthma in an other family member; Cancer in her brother and another family member; Heart disease in her father.   ROS:  Please see the history of present illness. Otherwise, complete review of systems is positive for none.   All other systems are reviewed and negative.   Physical Exam: VS:  Pulse 61   Ht 5' 2 (1.575 m)   Wt 234 lb (106.1 kg)   SpO2 91%   BMI 42.80 kg/m , BMI Body mass index is 42.8 kg/m.  Wt Readings from Last 3 Encounters:  10/26/23 234 lb (106.1 kg)  07/21/22 243 lb (110.2 kg)  10/14/21 230 lb (104.3 kg)    General: Patient appears comfortable at rest. HEENT: Conjunctiva and lids normal, oropharynx clear with moist mucosa. Neck: Supple, no elevated JVP or carotid bruits, no thyromegaly. Lungs: Clear to auscultation, nonlabored breathing at rest. Cardiac: Regular rate and rhythm, no S3 or significant systolic murmur, no pericardial rub. Abdomen: Soft, nontender, no hepatomegaly, bowel sounds present, no guarding or rebound. Extremities: No pitting edema, distal pulses 2+. Skin: Warm and dry. Musculoskeletal: No kyphosis. Neuropsychiatric: Alert and oriented x3, affect grossly appropriate.  ECG: Sinus bradycardia  Recent Labwork: No results found for requested labs within last 365 days.  No results found for: CHOL, TRIG, HDL, CHOLHDL, VLDL, LDLCALC, LDLDIRECT   Assessment and Plan:  Chronic diastolic heart failure - DOE multifactorial, morbid obesity versus diastolic heart failure. - Chronic stable DOE with no recent worsening.  No orthopnea, PND or leg swelling. - Echocardiogram in May 2024 showed normal LVEF, G2 DD, normal RV function, CVP 3 mmHg and no valvular heart disease. - Continue p.o. Lasix  40 mg every other day (due to frequent urination, patient switched to every other day regimen). - Start Farxiga  10 mg once daily and obtain BMP in 5 days. - Strongly encouraged weight loss.  HTN, controlled - Continue metoprolol  tartrate 50 mg daily, management per PCP.  HLD - Continue gemfibrozil  600 mg once daily and atorvastatin 20 mg daily at bedtime. - Management per PCP.  I have spent a total of 30 minutes with patient reviewing chart, EKGs, labs and  examining patient as well as establishing an assessment and plan that was discussed with the patient.  > 50% of time was spent in direct patient care.     Medication Adjustments/Labs and Tests Ordered: Current medicines are reviewed at length with the patient today.  Concerns regarding medicines are outlined above.   Tests Ordered: No orders of the defined types were placed in this encounter.   Medication Changes: No orders of the defined types were placed in this encounter.   Disposition:  Follow up 6 months  Signed, Watson Robarge Arleta Maywood, MD, 10/26/2023 3:12 PM     [facility] [address] [place], [place] [address]

## 2023-10-28 ENCOUNTER — Other Ambulatory Visit (HOSPITAL_COMMUNITY): Payer: Self-pay

## 2023-10-28 ENCOUNTER — Telehealth: Payer: Self-pay

## 2023-10-28 ENCOUNTER — Telehealth: Payer: Self-pay | Admitting: Internal Medicine

## 2023-10-28 NOTE — Telephone Encounter (Signed)
 Pt c/o medication issue:  1. Name of Medication: dapagliflozin  propanediol (FARXIGA ) 10 MG TABS tablet   2. How are you currently taking this medication (dosage and times per day)?    3. Are you having a reaction (difficulty breathing--STAT)? no  4. What is your medication issue? Medications is too expensive for the patient. Please advise

## 2023-10-28 NOTE — Telephone Encounter (Signed)
 Noted and thank you

## 2023-10-28 NOTE — Telephone Encounter (Signed)
 Enrolled in grant. Billing info and instructions faxed to pharmacy. Pt informed via mychart.

## 2023-10-28 NOTE — Telephone Encounter (Signed)
 Patient Advocate Encounter   The patient was approved for a [employer] grant that will help cover the cost of FARXIGA  Total amount awarded, $4,500.  Effective: 09/28/23 - 09/26/24   APW:[id number] ERW:[id number] Hmnle:[id number] PI:[id number]   Pharmacy provided with approval and processing information. Patient informed via RHONA Jennifer Bennett, CPhT  Pharmacy Patient Advocate Specialist  Direct Number: [phone] Fax: [phone]

## 2023-11-01 ENCOUNTER — Telehealth: Payer: Self-pay | Admitting: Internal Medicine

## 2023-11-01 NOTE — Telephone Encounter (Addendum)
 Pt had a dental appt on today. She states that her BP is elevated. She does report slight dizziness. She reports that the first BP was 213/103, 15 mins later it was 193/94 and right before she left it was 177/94. She states that she feels fine and is just nervous about the elevated about her BP. Please advise.

## 2023-11-01 NOTE — Telephone Encounter (Signed)
 Pt c/o BP issue: STAT if pt c/o blurred vision, one-sided weakness or slurred speech  1. What are your last 5 BP readings? 213/103; 193/94; 177/94  2. Are you having any other symptoms (ex. Dizziness, headache, blurred vision, passed out)? Slight dizziness   3. What is your BP issue?  Patient states that she had two tooth pull at the dentist office. And they wanted patient to call us  to let our office know about her bp.

## 2023-11-02 ENCOUNTER — Ambulatory Visit

## 2023-11-03 ENCOUNTER — Other Ambulatory Visit (HOSPITAL_COMMUNITY)
Admission: RE | Admit: 2023-11-03 | Discharge: 2023-11-03 | Disposition: A | Discharge status: Home / Self Care | Source: Ambulatory Visit | Attending: Internal Medicine | Admitting: Internal Medicine

## 2023-11-03 ENCOUNTER — Ambulatory Visit

## 2023-11-03 DIAGNOSIS — Z79899 Other long term (current) drug therapy: Secondary | ICD-10-CM | POA: Insufficient documentation

## 2023-11-03 LAB — BASIC METABOLIC PANEL WITH GFR
Anion gap: 17 — ABNORMAL HIGH (ref 5–15)
BUN: 19 mg/dL (ref 8–23)
CO2: 22 mmol/L (ref 22–32)
Calcium: 9.7 mg/dL (ref 8.9–10.3)
Chloride: 103 mmol/L (ref 98–111)
Creatinine, Ser: 0.77 mg/dL (ref 0.44–1.00)
GFR, Estimated: >60 mL/min (ref >60)
Glucose, Bld: 94 mg/dL (ref 70–99)
Potassium: 4 mmol/L (ref 3.5–5.1)
Sodium: 142 mmol/L (ref 135–145)

## 2023-11-03 MED ORDER — CARVEDILOL 6.25 MG PO TABS: 6.2500 mg | ORAL_TABLET | Freq: Two times a day (BID) | ORAL | 11 refills | Status: AC

## 2023-11-03 NOTE — Telephone Encounter (Signed)
 Pt notified and will stop taking Metoprolol  and start taking Coreg  6.25 mg two times daily.

## 2023-11-10 ENCOUNTER — Ambulatory Visit: Payer: Self-pay | Admitting: Internal Medicine

## 2023-11-14 DIAGNOSIS — R001 Bradycardia, unspecified: Secondary | ICD-10-CM | POA: Diagnosis not present

## 2023-11-14 DIAGNOSIS — I10 Essential (primary) hypertension: Secondary | ICD-10-CM | POA: Diagnosis not present

## 2023-11-14 DIAGNOSIS — M150 Primary generalized (osteo)arthritis: Secondary | ICD-10-CM | POA: Diagnosis not present

## 2023-11-14 DIAGNOSIS — E782 Mixed hyperlipidemia: Secondary | ICD-10-CM | POA: Diagnosis not present

## 2023-12-01 ENCOUNTER — Telehealth: Payer: Self-pay

## 2023-12-01 ENCOUNTER — Other Ambulatory Visit (HOSPITAL_COMMUNITY): Payer: Self-pay

## 2023-12-01 NOTE — Progress Notes (Signed)
   12/01/2023  Patient ID: Jennifer Bennett, female   DOB: July 04, 1945, 78 y.o.   MRN: [id number]  Contacted patient regarding referral for medication access from Leigh Lung, MD .   Appointment scheduled for 12/02/23 at 9:00 am.  Heather Factor, PharmD Clinical Pharmacist  [phone]

## 2023-12-02 ENCOUNTER — Telehealth: Payer: Self-pay

## 2023-12-02 NOTE — Progress Notes (Signed)
 12/02/2023 Name: Jennifer Bennett MRN: [id number] DOB: [birth year]  Chief Complaint  Patient presents with   Medication Assistance    Breztri    Jennifer Bennett is a 78 y.o. year old female who presented for a telephone visit.   They were referred to the pharmacist by their PCP for assistance in managing medication access.    Subjective: Patient was referred for medication assistance for Trelegy, which she had previously been prescribed. Her PCP provided her with a Breztri sample to cover her. I spoke with the patient and screened her for Trelegy PAP. She met the income limit qualification, but did not meet the out-of-pocket (OOP) expense qualification of $600 OOP. She does meet the full qualifications for [employer] and [employer] Extra-Help/Low-income Subsidy (LIS).   Care Team: Primary Care Provider: Leigh Lung, MD ; Next Scheduled Visit: 01/17/2024   Medication Access/Adherence  Current Pharmacy:  [facility] [place], [place] - [address] [place] [address] Phone: [phone] Fax: [phone]  [facility] - [place], [place] - [address] [facility] [address] [place] [address] [place] [address] Phone: [phone] Fax: [phone]   Patient reports affordability concerns with their medications: No  Patient reports access/transportation concerns to their pharmacy: No  Patient reports adherence concerns with their medications:  No    2025 Medication Assistance Application Summary:  Patient was outreached regarding medication assistance for 2025. Verified address, insurance for 2025, and income. Patient is interested in PAP for 2025 for Breztri, no other new medications were identified for medication assistance. I will assist the patient in applying for LIS and collaborate with PCP to continue Breztri.The patient is running low on her sample.  Medication Assistance Findings:  Medication  assistance needs identified: Breztri     Additional medication assistance options reviewed with patient as warranted:  No other options identified  Objective:  No results found for: HGBA1C  Lab Results  Component Value Date   CREATININE 0.77 11/03/2023   BUN 19 11/03/2023   NA 142 11/03/2023   K 4.0 11/03/2023   CL 103 11/03/2023   CO2 22 11/03/2023    No results found for: CHOL, HDL, LDLCALC, LDLDIRECT, TRIG, CHOLHDL  Medications Reviewed Today     Reviewed by Graylon Keen, [facility] (Pharmacist) on 12/02/23 at 1022  Med List Status: <None>   Medication Order Taking? Sig Documenting Provider Last Dose Status Informant  acetaminophen  (TYLENOL ) 650 MG CR tablet [id number] Yes Take 650 mg by mouth every 8 (eight) hours as needed for pain. [provider]  Active   allopurinol  (ZYLOPRIM ) 100 MG tablet [id number] Yes Take 1 tablet (100 mg total) by mouth daily. Landy Barnie RAMAN, NP  Active Self  aspirin  EC 81 MG tablet [id number] Yes Take 81 mg daily by mouth. [provider]  Active Self  atorvastatin (LIPITOR) 20 MG tablet [id number] Yes Take 20 mg by mouth at bedtime. [provider]  Active     Discontinued 12/02/23 1006 (Duplicate)     Discontinued 12/02/23 1008 (No longer needed (for PRN medications)) budesonide-glycopyrrolate-formoterol (BREZTRI AEROSPHERE) 160-9-4.8 MCG/ACT AERO inhaler [id number] Yes Inhale 2 puffs into the lungs 2 (two) times daily. [provider]  Active   carvedilol  (COREG ) 6.25 MG tablet [id number] Yes Take 1 tablet (6.25 mg total) by mouth 2 (two) times daily. Mallipeddi, Vishnu P, MD  Active   dapagliflozin  propanediol (FARXIGA ) 10 MG TABS tablet [id number] Yes Take 1 tablet (10  mg total) by mouth daily before breakfast. Mallipeddi, Vishnu P, MD  Active     Discontinued 12/02/23 1009 (Change in therapy)   furosemide  (LASIX ) 40 MG tablet [id number] Yes Take 1 tablet (40 mg total) by mouth daily. Mallipeddi, Vishnu  P, MD  Active   gemfibrozil  (LOPID ) 600 MG tablet [id number] Yes Take 1 tablet (600 mg total) by mouth daily. Landy Barnie RAMAN, NP  Active Self  meloxicam  (MOBIC ) 7.5 MG tablet [id number] Yes Take one every other day  Patient taking differently: Take 7.5 mg by mouth. Take one every other day as needed   Margrette Taft BRAVO, MD  Active   polyethylene glycol (MIRALAX  / GLYCOLAX ) 17 g packet [id number]  Take 17 g by mouth daily.  Patient not taking: Reported on 12/02/2023   Margrette Taft BRAVO, MD  Active Self  potassium chloride  SA (KLOR-CON  M20) 20 MEQ tablet [id number] Yes Take 1 tablet (20 mEq total) by mouth daily. Mallipeddi, Vishnu P, MD  Active   Tetrahyd-Glyc-Hypro-PEG-ZnSulf (VISINE TOTALITY MULTI-SYMPTOM OP) [id number]  Apply 1 drop 3 (three) times daily as needed to eye (for dry/irritated eyes.).  Patient not taking: Reported on 12/02/2023   [provider]  Active Self             Plan: - I assisted patient in applying for LIS - I provided the patient with another Breztri sample - I will route patient assistance letter to pharmacy technician who will coordinate patient assistance program application process for medications listed above.  Pharmacy technician will assist with obtaining all required documents from both patient and provider(s) and submit application(s) once completed.    Thank you for allowing pharmacy to be a part of this patient's care.   Follow Up Plan: I will follow up in 2 weeks to check on PAP status.  Heather Factor, PharmD Clinical Pharmacist  [phone]

## 2023-12-21 ENCOUNTER — Other Ambulatory Visit (HOSPITAL_COMMUNITY): Payer: Self-pay

## 2023-12-21 ENCOUNTER — Telehealth: Payer: Self-pay

## 2023-12-21 NOTE — Telephone Encounter (Signed)
 PAP: Patient assistance application for Breztri through [employer] (AZ[employer]) has been mailed to pt's home address on file. Provider portion of application will be faxed to provider's office.  Provider portion of application has been faxied to Dr. Elna Potters at [facility]

## 2023-12-23 NOTE — Telephone Encounter (Signed)
 Received provider portion of patient assistance application

## 2023-12-29 ENCOUNTER — Telehealth: Payer: Self-pay

## 2023-12-29 NOTE — Progress Notes (Signed)
   12/29/2023  Patient ID: Jennifer Bennett, female   DOB: 1945/07/01, 78 y.o.   MRN: [id number]  Contacted patient regarding referral for medication access from [place], MD .   The patient verified that she has not yet received her [employer] PAP application in the mail. She is willing to come by the office to sign, since she is running low on the last sample provided. I will print the application for her to come by and sign at her earliest convenience.   Heather Factor, PharmD Clinical Pharmacist  [phone]

## 2024-01-10 NOTE — Telephone Encounter (Signed)
 PAP: Application for Jennifer Bennett has been submitted to [employer] (AZ&Me), via fax

## 2024-01-11 ENCOUNTER — Other Ambulatory Visit: Payer: Self-pay

## 2024-01-11 ENCOUNTER — Other Ambulatory Visit (HOSPITAL_COMMUNITY): Payer: Self-pay

## 2024-01-11 MED ORDER — DAPAGLIFLOZIN PROPANEDIOL 10 MG PO TABS
10.0000 mg | ORAL_TABLET | Freq: Every day | ORAL | 3 refills | Status: AC
  Filled 2024-01-11 (×2): qty 90, 90d supply, fill #0
  Filled 2024-04-03: qty 90, 90d supply, fill #1

## 2024-01-11 NOTE — Progress Notes (Signed)
 Pharmacy Medication Assistance Program Note    01/11/2024  Patient ID: Jennifer Bennett, female   DOB: 09-Dec-1945, 78 y.o.   MRN: [id number]     12/21/2023  Outreach Medication One  Initial Outreach Date (Medication One) 12/21/2023  Manufacturer Medication One [employer]  [employer] Bretztri  Type of Assistance Manufacturer Assistance  Date Application Sent to Patient 12/21/2023  Application Items Requested Application  Date Application Sent to Prescriber 12/21/2023  Name of Prescriber Elna Potters  Date Application Received From Patient 01/10/2024  Date Application Received From Provider 12/23/2023  Date Application Submitted to Manufacturer 01/10/2024  Method Application Sent to Manufacturer Fax  Patient Assistance Determination Approved  Approval Start Date 01/11/2024  Approval End Date 04/11/2025  Patient Notification Method Telephone Call     Signature

## 2024-01-11 NOTE — Telephone Encounter (Signed)
 PAP: Patient assistance application for Breztri has been approved by PAP Companies: [employer] from 01/10/2024 to 04/11/2025. Medication should be delivered to PAP Delivery: Home. For further shipping updates, please contact [employer] (AZ[employer]) at [phone]. Patient ID is: [id number]

## 2024-01-17 DIAGNOSIS — I10 Essential (primary) hypertension: Secondary | ICD-10-CM | POA: Diagnosis not present

## 2024-01-17 DIAGNOSIS — R0609 Other forms of dyspnea: Secondary | ICD-10-CM | POA: Diagnosis not present

## 2024-01-17 DIAGNOSIS — R0602 Shortness of breath: Secondary | ICD-10-CM | POA: Diagnosis not present

## 2024-01-17 DIAGNOSIS — R051 Acute cough: Secondary | ICD-10-CM | POA: Diagnosis not present

## 2024-01-17 DIAGNOSIS — J069 Acute upper respiratory infection, unspecified: Secondary | ICD-10-CM | POA: Diagnosis not present

## 2024-02-16 ENCOUNTER — Telehealth: Payer: Self-pay

## 2024-02-16 NOTE — Progress Notes (Signed)
   02/16/2024  Patient ID: Jennifer Bennett, female   DOB: Apr 17, 1945, 78 y.o.   MRN: [id number]  Contacted patient regarding referral for medication access and medication management from Leigh Lung, MD .   Appointment scheduled for 02/22/24 at 11:00am.  Heather Factor, PharmD Clinical Pharmacist  [phone]

## 2024-02-22 ENCOUNTER — Telehealth: Payer: Self-pay

## 2024-02-22 NOTE — Progress Notes (Signed)
 02/22/2024 Name: Jennifer Bennett MRN: [id number] DOB: 03/20/1946  Chief Complaint  Patient presents with   Medication Management   Medication Assistance    Jennifer Bennett is a 78 y.o. year old female who presented for a telephone visit.   They were referred to the pharmacist by their PCP for assistance in managing hypertension, hyperlipidemia/cardiovascular risk reduction, medication access, and complex medication management.    Subjective: Patient presents with medication bottles available for comprehensive medication review. She is approved for [place] patient assistance for Breztri and Farxiga  through 2026. Patient reports nonadherence to gemfibrozil , had a surplus from mail order pharmacy, but has not refilled the medication at all in 2025.  Care Team: Primary Care Provider: Leigh Lung, MD ; Next Scheduled Visit: 03/26/24 Cardiologist: Laymon Qua; Next Scheduled Visit: 03/30/24  Medication Access/Adherence  Current Pharmacy:  [facility] - [place], [place] - [address] [place] [address] Phone: [phone] Fax: [phone]  [facility] - [place], [place] - [address] [facility] [address] [place] [address] [place] [address] Phone: [phone] Fax: [phone]  [place] [facility] [address] [place] [address] Phone: [phone] Fax: [phone]   Patient reports affordability concerns with their medications: No  Patient reports access/transportation concerns to their pharmacy: No  Patient reports adherence concerns with their medications:  No     Hypertension:  Current medications: carvedilol  6.25 mg twice daily  Patient has a validated, automated, upper arm home BP cuff Current blood pressure readings readings: pt has not recorded her blood pressures recently  Patient denies hypotensive s/sx including dizziness,  lightheadedness.  Patient denies hypertensive symptoms including headache, chest pain, shortness of breath  Current meal patterns: patient reports monitoring sodium and fried food intake  Current physical activity: patient reports going to senior center for workouts 2-3 times per week   Hyperlipidemia/ASCVD Risk Reduction  Current lipid lowering medications: atorvastatin 20 mg daily, gemfibrozil  600 mg daily   Antiplatelet regimen: aspirin  81 mg daily   Medication Management:  Current adherence strategy: patient uses a pill box and gets extended day supplies from his mail order pharmacy  Patient reports Good adherence to medications  Patient reports the following barriers to adherence: reports surplus when refills come in early  Recent fill dates:  Carvedilol  6.25 mg - last filled 02/06/24 x 30 ds Atorvastatin 20 mg - last filled 01/18/24 x 90 ds Furosemide  40 mg - last filled 01/18/24 x 100 ds Potassium Chloride  20 mEq - last filled x 100 ds Farxiga  10 mg - last filled 01/11/24 x 90 ds Allopurinol  100 mg - last filled 12/22/23 x 100 ds   Objective:  Lipid Panel: 10/11/2023  Total Cholesterol: 197 HDL: 47 Triglycerides: 144 LDL: 124  Lab Results  Component Value Date   CREATININE 0.77 11/03/2023   BUN 19 11/03/2023   NA 142 11/03/2023   K 4.0 11/03/2023   CL 103 11/03/2023   CO2 22 11/03/2023    Medications Reviewed Today     Reviewed by Graylon Keen, [facility] (Pharmacist) on 02/22/24 at 1646  Med List Status: <None>   Medication Order Taking? Sig Documenting Provider Last Dose Status Informant  acetaminophen  (TYLENOL ) 650 MG CR tablet [id number] Yes Take 650 mg by mouth every 8 (eight) hours as needed for pain. [provider]  Active   allopurinol  (ZYLOPRIM ) 100 MG tablet [id number] Yes Take 1 tablet (100 mg total)  by mouth daily. Landy Barnie RAMAN, NP  Active Self  aspirin  EC 81 MG tablet [id number] Yes Take 81 mg daily by mouth. [provider]   Active Self  atorvastatin (LIPITOR) 20 MG tablet [id number] Yes Take 20 mg by mouth at bedtime. [provider]  Active   budesonide-glycopyrrolate-formoterol (BREZTRI AEROSPHERE) 160-9-4.8 MCG/ACT AERO inhaler [id number] Yes Inhale 2 puffs into the lungs 2 (two) times daily.  Patient taking differently: Inhale 2 puffs into the lungs 2 (two) times daily.   [provider]  Active   carvedilol  (COREG ) 6.25 MG tablet [id number] Yes Take 1 tablet (6.25 mg total) by mouth 2 (two) times daily. Mallipeddi, Vishnu P, MD  Active   dapagliflozin  propanediol (FARXIGA ) 10 MG TABS tablet [id number] Yes Take 1 tablet (10 mg total) by mouth daily before breakfast. Mallipeddi, Vishnu P, MD  Active   furosemide  (LASIX ) 40 MG tablet [id number] Yes Take 1 tablet (40 mg total) by mouth daily. Mallipeddi, Vishnu P, MD  Active   gemfibrozil  (LOPID ) 600 MG tablet [id number]  Take 1 tablet (600 mg total) by mouth daily.  Patient not taking: Reported on 02/22/2024   Landy Barnie RAMAN, NP  Active Self  meloxicam  (MOBIC ) 7.5 MG tablet [id number] Yes Take one every other day  Patient taking differently: Take 7.5 mg by mouth. Take one every other day as needed   Margrette Taft BRAVO, MD  Active   polyethylene glycol (MIRALAX  / GLYCOLAX ) 17 g packet [id number]  Take 17 g by mouth daily.  Patient not taking: Reported on 02/22/2024   Margrette Taft BRAVO, MD  Active Self  potassium chloride  SA (KLOR-CON  M20) 20 MEQ tablet [id number] Yes Take 1 tablet (20 mEq total) by mouth daily. Mallipeddi, Vishnu P, MD  Active   Tetrahyd-Glyc-Hypro-PEG-ZnSulf (VISINE TOTALITY MULTI-SYMPTOM OP) [id number] Yes Apply 1 drop 3 (three) times daily as needed to eye (for dry/irritated eyes.). [provider]  Active Self              Assessment/Plan:   Hypertension: - Currently uncontrolled - Reviewed long term cardiovascular and renal outcomes of uncontrolled blood pressure - Reviewed appropriate blood pressure  monitoring technique and reviewed goal blood pressure. Recommended to check home blood pressure and heart rate daily, record numbers for follow up visits. - Recommend to continue current regimen      Hyperlipidemia/ASCVD Risk Reduction: - Currently uncontrolled.  - Reviewed long term complications of uncontrolled cholesterol - Reviewed dietary recommendations including increasing protein and vegetable intake, reducing fried food and sodium intake - Reviewed lifestyle recommendations including continuing workouts, recommended 150 min per week of moderate-intensity aerobic activity - Recommend to continue current regimen, patient will resume gemfibrozil  600 mg daily   Medication Management: - Currently strategy sufficient to maintain appropriate adherence to prescribed medication regimen - Suggested use of weekly pill box to organize medications - Created list of medication, indication, and administration time. Discussed with patient, will f/u post next PCP visit - Discussed collaboration with local pharmacies for adherence packaging. Reviewed local pharmacies with adherence packaging options. Patient elects to remain with current pharmacies.    Follow Up Plan: Will follow up in 4 week after next PCP visit.  Heather Factor, PharmD Clinical Pharmacist  [phone]

## 2024-02-27 NOTE — Progress Notes (Unsigned)
 Cardiology Office Note    Date:  02/28/2024  ID:  Nakoma, Gotwalt October 19, 1945, MRN [id number] Cardiologist: Diannah SHAUNNA Maywood, MD { :  History of Present Illness:    Jennifer Bennett is a 78 y.o. female with past medical history of HTN, HLD, asthma and baseline dyspnea on exertion who presents to the office today for evaluation of shortness of breath.  She was last examined by Dr. Mallipeddi in 10/2023 and reported having shortness of breath for over 2 years and recent echocardiogram had shown a preserved EF with grade 2 diastolic dysfunction and stress testing had shown breast attenuation but ischemia could not be completely ruled out. She was taking Lasix  40 mg every other day due to frequent urination with daily dosing. She was started on Farxiga  10 mg daily and weight loss was encouraged.  In talking with the patient today, she reports having dyspnea on exertion but says this has overall been stable for several years and symptoms have actually improved over the past few months. Says that she was previously not taking Lasix  regularly due to frequent urination with this but has been trying to take more consistently. She does hold her dose until she returns home if she has an appointment or goes to church on Sunday.  She denies any orthopnea or PND. Reports occasional lower extremity edema. She does add salt to a lot of her foods and reports consuming fast food regularly as well. No recent chest pain or palpitations. Does report having pain along her shoulders bilaterally but feels like this is due to known arthritis. Feels that her PCP changed her cholesterol medication but she is unsure of the exact medication or dose that she is taking at this time.  Studies Reviewed:   EKG: EKG is not ordered today.  NST: 07/2022   Stress ECG is negative for ischemia and arrythmias.   LV perfusion is abnormal. There is a small reversible defect with mild reduction in uptake present in  the apical anterior wall consistent with artifact caused by breast attenuation but ischemia cannot be ruled out completely.   Left ventricular function is normal. Nuclear stress EF: 74 %.   The study is normal. The study is low risk.  Echocardiogram: 08/2022 IMPRESSIONS     1. Left ventricular ejection fraction, by estimation, is 65 to 70%. The  left ventricle has normal function. The left ventricle has no regional  wall motion abnormalities. There is mild concentric left ventricular  hypertrophy. Left ventricular diastolic  parameters are consistent with Grade II diastolic dysfunction  (pseudonormalization). The average left ventricular global longitudinal  strain is -24.9 %. The global longitudinal strain is normal.   2. Right ventricular systolic function is normal. The right ventricular  size is normal. Tricuspid regurgitation signal is inadequate for assessing  PA pressure.   3. The mitral valve is degenerative. Trivial mitral valve regurgitation.   4. The aortic valve was not well visualized. Aortic valve regurgitation  is not visualized. Aortic valve sclerosis/calcification is present,  without any evidence of aortic stenosis. Aortic valve mean gradient  measures 7.7 mmHg.   5. The inferior vena cava is normal in size with greater than 50%  respiratory variability, suggesting right atrial pressure of 3 mmHg.   Comparison(s): No prior Echocardiogram.   Physical Exam:   VS:  BP 126/74   Pulse 62   Ht 5' 1.5 (1.562 m)   Wt 228 lb (103.4 kg)   SpO2 93%   BMI  42.38 kg/m    Wt Readings from Last 3 Encounters:  02/28/24 228 lb (103.4 kg)  10/26/23 234 lb (106.1 kg)  07/21/22 243 lb (110.2 kg)     GEN: Pleasant female appearing in no acute distress NECK: No JVD; No carotid bruits CARDIAC: RRR, no murmurs, rubs, gallops RESPIRATORY:  Clear to auscultation without rales, wheezing or rhonchi  ABDOMEN: Appears non-distended. No obvious abdominal masses. EXTREMITIES: No  clubbing or cyanosis. No pitting edema.  Distal pedal pulses are 2+ bilaterally.   Assessment and Plan:   1. Dyspnea on exertion - This has been stable for the past several years per her report and has improved since being on Lasix  and Farxiga . As discussed above, prior echocardiogram in 08/2022 showed a preserved EF with grade 2 diastolic dysfunction and NST showed no definitive ischemia but did have artifact due to breast tissue attenuation.  - Given improvement in symptoms with diuretic therapy, will continue current medical therapy for now with Lasix  40 mg daily and Farxiga  10 mg daily. I did encourage her to take her potassium on the days that she takes Lasix . She did have recent labs with her PCP within the past month and we will request a copy of these.  We also reviewed the importance of limiting her sodium intake.  2. Essential hypertension - BP was initially elevated at 138/83, rechecked and improved to 126/74 during today's visit. Continue current medical therapy with Coreg  6.25 mg twice daily and Lasix  40 mg daily.  3. Hyperlipidemia LDL goal <70 - Will request a copy of most recent labs from her PCP. As discussed above, she is unsure if she stopped taking one of her statins when this was recently switched by her PCP. I asked her to verify as she is currently listed as taking Atorvastatin 20 mg daily and Lopid  600 mg daily.   Signed, Laymon CHRISTELLA Qua, PA-C

## 2024-02-28 ENCOUNTER — Encounter: Payer: Self-pay | Admitting: Student

## 2024-02-28 ENCOUNTER — Ambulatory Visit: Attending: Student | Admitting: Student

## 2024-02-28 VITALS — BP 126/74 | HR 62 | Ht 61.5 in | Wt 228.0 lb

## 2024-02-28 DIAGNOSIS — I10 Essential (primary) hypertension: Secondary | ICD-10-CM | POA: Diagnosis not present

## 2024-02-28 DIAGNOSIS — R0609 Other forms of dyspnea: Secondary | ICD-10-CM

## 2024-02-28 DIAGNOSIS — E785 Hyperlipidemia, unspecified: Secondary | ICD-10-CM | POA: Diagnosis not present

## 2024-02-28 NOTE — Patient Instructions (Signed)
 Medication Instructions:   Check your medications and make sure not taking more than one statin.   Continue Lasix  and take potassium supplement when you take Lasix .   *If you need a refill on your cardiac medications before your next appointment, please call your pharmacy*   Follow-Up: At [facility], you and your health needs are our priority.  As part of our continuing mission to provide you with exceptional heart care, our providers are all part of one team.  This team includes your primary Cardiologist (physician) and Advanced Practice Providers or APPs (Physician Assistants and Nurse Practitioners) who all work together to provide you with the care you need, when you need it.  Your next appointment:   6 month(s)  Provider:   You may see Vishnu P Mallipeddi, MD or one of the following Advanced Practice Providers on your designated Care Team:   Danel Studzinski, PA-C  [place], [place] Olivia [place], [place]     We recommend signing up for the patient portal called MyChart.  Sign up information is provided on this After Visit Summary.  MyChart is used to connect with patients for Virtual Visits (Telemedicine).  Patients are able to view lab/test results, encounter notes, upcoming appointments, etc.  Non-urgent messages can be sent to your provider as well.   To learn more about what you can do with MyChart, go to [email or web address].

## 2024-03-05 ENCOUNTER — Telehealth: Payer: Self-pay | Admitting: Internal Medicine

## 2024-03-05 NOTE — Telephone Encounter (Signed)
 Patient wanted to follow up about a question that was asked during her visit last week about what medication she was taking she is taking atorvastatin (LIPITOR)

## 2024-03-05 NOTE — Telephone Encounter (Signed)
    Thank you for the update. I just wanted to make sure that she was not taking two statins. Can continue Atorvastatin at current dosing.  Signed, Laymon CHRISTELLA Qua, PA-C 03/05/2024, 1:49 PM Pager: [phone]

## 2024-03-29 ENCOUNTER — Telehealth: Payer: Self-pay

## 2024-03-30 ENCOUNTER — Ambulatory Visit: Admitting: Student

## 2024-04-09 ENCOUNTER — Other Ambulatory Visit (INDEPENDENT_AMBULATORY_CARE_PROVIDER_SITE_OTHER)

## 2024-04-09 ENCOUNTER — Ambulatory Visit: Payer: Medicare Other | Admitting: Orthopedic Surgery

## 2024-04-09 ENCOUNTER — Encounter: Payer: Self-pay | Admitting: Orthopedic Surgery

## 2024-04-09 ENCOUNTER — Other Ambulatory Visit: Payer: Self-pay

## 2024-04-09 DIAGNOSIS — Z96642 Presence of left artificial hip joint: Secondary | ICD-10-CM | POA: Diagnosis not present

## 2024-04-09 DIAGNOSIS — Z96641 Presence of right artificial hip joint: Secondary | ICD-10-CM

## 2024-04-09 DIAGNOSIS — Z96643 Presence of artificial hip joint, bilateral: Secondary | ICD-10-CM | POA: Diagnosis not present

## 2024-04-09 DIAGNOSIS — M542 Cervicalgia: Secondary | ICD-10-CM | POA: Diagnosis not present

## 2024-04-09 DIAGNOSIS — M160 Bilateral primary osteoarthritis of hip: Secondary | ICD-10-CM

## 2024-04-09 NOTE — Patient Instructions (Addendum)
 Physical therapy has been ordered for you at [facility]. They should call you to schedule, [phone] is the phone number to call, if you want to call to schedule.

## 2024-04-09 NOTE — Progress Notes (Signed)
" ° ° °  04/09/2024   Chief Complaint  Patient presents with   Post-op Follow-up    Both hips     Encounter Diagnoses  Name Primary?   S/P total left hip arthroplasty 03/13/21 Yes   H/O total hip arthroplasty, right 2005    Bilateral hip joint arthritis     What [facility] do you use ? [facility]______  DOI/DOS/ Date:    Did you get better, worse or no change (Answer below)   Unchanged      "

## 2024-04-09 NOTE — Progress Notes (Signed)
" ° ° °  04/09/2024   Chief Complaint  Patient presents with   Post-op Follow-up    Both hips    PROBLEMS AND COMPLAINTS   F/U BL THA  RIGHT KNEE NEEDS SUPPORT  LEFT ARM AND NECK PAIN w/ NUMBNESS    Encounter Diagnoses  Name Primary?   S/P total left hip arthroplasty 03/13/21 Yes   H/O total hip arthroplasty, right 2005    Bilateral hip joint arthritis    Neck pain     What [facility] you [facility] ? [facility]______  DOI/DOS/ Date:    Did you get better, worse or no change (Answer below)   HISTORY: right total hip 20-year follow-up left total hip 3-year follow-up  Surveillance  Imaging.  No major complaints with the hip patient is complaining of a left knee that she feels needs support not really having a lot of pain but having trouble walking on the left side  She also has some neck pain and arm pain with radicular symptoms into the left hand associated with some numbness and tingling  Exam findings include normal flexion extension of both hips with no pain she does have a scoliosis causing some leg length discrepancy and we ordered 1/2 inch lift  On the left upper extremity and cervical spine we find decreased range of motion painful range of motion no obvious or detectable sensory changes normal neurovascular exam otherwise  Recommend the following Half inch lift right foot  X-rays in a year both hips  Brace right knee for support economy hinged  Physical therapy for radicular symptoms left upper extremity  Recheck 1 year           "

## 2024-04-13 ENCOUNTER — Encounter (HOSPITAL_COMMUNITY): Payer: Self-pay | Admitting: *Deleted

## 2024-04-13 ENCOUNTER — Other Ambulatory Visit: Payer: Self-pay

## 2024-04-13 ENCOUNTER — Emergency Department (HOSPITAL_COMMUNITY)
Admission: EM | Admit: 2024-04-13 | Discharge: 2024-04-13 | Disposition: A | Discharge status: Home / Self Care | Source: Home / Self Care | Attending: Emergency Medicine | Admitting: Emergency Medicine

## 2024-04-13 ENCOUNTER — Emergency Department (HOSPITAL_COMMUNITY)

## 2024-04-13 DIAGNOSIS — I110 Hypertensive heart disease with heart failure: Secondary | ICD-10-CM | POA: Insufficient documentation

## 2024-04-13 DIAGNOSIS — I509 Heart failure, unspecified: Secondary | ICD-10-CM | POA: Diagnosis not present

## 2024-04-13 DIAGNOSIS — R109 Unspecified abdominal pain: Secondary | ICD-10-CM | POA: Insufficient documentation

## 2024-04-13 DIAGNOSIS — Z79899 Other long term (current) drug therapy: Secondary | ICD-10-CM | POA: Insufficient documentation

## 2024-04-13 DIAGNOSIS — Z7982 Long term (current) use of aspirin: Secondary | ICD-10-CM | POA: Insufficient documentation

## 2024-04-13 DIAGNOSIS — M545 Low back pain, unspecified: Secondary | ICD-10-CM | POA: Diagnosis present

## 2024-04-13 DIAGNOSIS — R0602 Shortness of breath: Secondary | ICD-10-CM | POA: Insufficient documentation

## 2024-04-13 LAB — URINALYSIS, ROUTINE W REFLEX MICROSCOPIC
Bacteria, UA: NONE SEEN
Bilirubin Urine: NEGATIVE
Glucose, UA: >=500 mg/dL — AB
Hgb urine dipstick: NEGATIVE
Ketones, ur: NEGATIVE mg/dL
Leukocytes,Ua: NEGATIVE
Nitrite: NEGATIVE
Protein, ur: 100 mg/dL — AB
Specific Gravity, Urine: 1.021 (ref 1.005–1.030)
pH: 6 (ref 5.0–8.0)

## 2024-04-13 LAB — CBC WITH DIFFERENTIAL/PLATELET
Abs Immature Granulocytes: 0.02 K/uL (ref 0.00–0.07)
Basophils Absolute: 0.1 K/uL (ref 0.0–0.1)
Basophils Relative: 1 %
Eosinophils Absolute: 0.2 K/uL (ref 0.0–0.5)
Eosinophils Relative: 2 %
HCT: 46 % (ref 36.0–46.0)
Hemoglobin: 14.3 g/dL (ref 12.0–15.0)
Immature Granulocytes: 0 %
Lymphocytes Relative: 22 %
Lymphs Abs: 1.6 K/uL (ref 0.7–4.0)
MCH: 27.1 pg (ref 26.0–34.0)
MCHC: 31.1 g/dL (ref 30.0–36.0)
MCV: 87.3 fL (ref 80.0–100.0)
Monocytes Absolute: 0.8 K/uL (ref 0.1–1.0)
Monocytes Relative: 10 %
Neutro Abs: 4.9 K/uL (ref 1.7–7.7)
Neutrophils Relative %: 65 %
Platelets: 280 K/uL (ref 150–400)
RBC: 5.27 MIL/uL — ABNORMAL HIGH (ref 3.87–5.11)
RDW: 13.7 % (ref 11.5–15.5)
WBC: 7.6 K/uL (ref 4.0–10.5)
nRBC: 0 % (ref 0.0–0.2)

## 2024-04-13 LAB — COMPREHENSIVE METABOLIC PANEL WITH GFR
ALT: 9 U/L (ref 0–44)
AST: 18 U/L (ref 15–41)
Albumin: 3.9 g/dL (ref 3.5–5.0)
Alkaline Phosphatase: 106 U/L (ref 38–126)
Anion gap: 12 (ref 5–15)
BUN: 19 mg/dL (ref 8–23)
CO2: 25 mmol/L (ref 22–32)
Calcium: 9.4 mg/dL (ref 8.9–10.3)
Chloride: 106 mmol/L (ref 98–111)
Creatinine, Ser: 0.76 mg/dL (ref 0.44–1.00)
GFR, Estimated: >60 mL/min
Glucose, Bld: 90 mg/dL (ref 70–99)
Potassium: 4.7 mmol/L (ref 3.5–5.1)
Sodium: 142 mmol/L (ref 135–145)
Total Bilirubin: 0.4 mg/dL (ref 0.0–1.2)
Total Protein: 7 g/dL (ref 6.5–8.1)

## 2024-04-13 LAB — LIPASE, BLOOD: Lipase: 35 U/L (ref 11–51)

## 2024-04-13 LAB — PRO BRAIN NATRIURETIC PEPTIDE: Pro Brain Natriuretic Peptide: 206 pg/mL

## 2024-04-13 LAB — TROPONIN T, HIGH SENSITIVITY: Troponin T High Sensitivity: <15 ng/L (ref 0–19)

## 2024-04-13 MED ORDER — ACETAMINOPHEN 500 MG PO TABS
1000.0000 mg | ORAL_TABLET | Freq: Once | ORAL | Status: AC
  Administered 2024-04-13: 1000 mg via ORAL
  Filled 2024-04-13: qty 2

## 2024-04-13 MED ORDER — METHOCARBAMOL 500 MG PO TABS: 500.0000 mg | ORAL_TABLET | Freq: Two times a day (BID) | ORAL | 0 refills | Status: AC

## 2024-04-13 MED ORDER — MORPHINE SULFATE (PF) 4 MG/ML IV SOLN
4.0000 mg | Freq: Once | INTRAVENOUS | Status: AC
  Administered 2024-04-13: 4 mg via INTRAVENOUS
  Filled 2024-04-13: qty 1

## 2024-04-13 MED ORDER — NAPROXEN 500 MG PO TABS: 500.0000 mg | ORAL_TABLET | Freq: Two times a day (BID) | ORAL | 0 refills | Status: AC

## 2024-04-13 MED ORDER — IOHEXOL 350 MG/ML SOLN
100.0000 mL | Freq: Once | INTRAVENOUS | Status: AC | PRN
  Administered 2024-04-13: 100 mL via INTRAVENOUS

## 2024-04-13 MED ORDER — ONDANSETRON HCL 4 MG/2ML IJ SOLN
4.0000 mg | Freq: Once | INTRAMUSCULAR | Status: AC
  Administered 2024-04-13: 4 mg via INTRAVENOUS
  Filled 2024-04-13: qty 2

## 2024-04-13 MED ORDER — OXYCODONE-ACETAMINOPHEN 5-325 MG PO TABS: 1.0000 | ORAL_TABLET | Freq: Four times a day (QID) | ORAL | 0 refills | Status: AC | PRN

## 2024-04-13 MED ORDER — LIDOCAINE 5 % EX PTCH
1.0000 | MEDICATED_PATCH | CUTANEOUS | Status: DC
  Administered 2024-04-13: 1 via TRANSDERMAL
  Filled 2024-04-13: qty 1

## 2024-04-13 MED ORDER — OXYCODONE HCL 5 MG PO TABS
5.0000 mg | ORAL_TABLET | Freq: Once | ORAL | Status: AC
  Administered 2024-04-13: 5 mg via ORAL
  Filled 2024-04-13: qty 1

## 2024-04-13 MED ORDER — KETOROLAC TROMETHAMINE 15 MG/ML IJ SOLN
15.0000 mg | Freq: Once | INTRAMUSCULAR | Status: AC
  Administered 2024-04-13: 15 mg via INTRAVENOUS
  Filled 2024-04-13: qty 1

## 2024-04-13 MED ORDER — METHYLPREDNISOLONE SODIUM SUCC 125 MG IJ SOLR
125.0000 mg | Freq: Once | INTRAMUSCULAR | Status: AC
  Administered 2024-04-13: 125 mg via INTRAVENOUS
  Filled 2024-04-13: qty 2

## 2024-04-13 MED ORDER — METHOCARBAMOL 500 MG PO TABS
500.0000 mg | ORAL_TABLET | Freq: Once | ORAL | Status: AC
  Administered 2024-04-13: 500 mg via ORAL
  Filled 2024-04-13: qty 1

## 2024-04-13 NOTE — Discharge Instructions (Addendum)
 May take naproxen twice a day as needed for pain.  Do not take extra ibuprofen at the same time.  May take oxycodone  every 6 hours as needed for severe pain.  May take 1 additional Tylenol  at same time.  You may take Robaxin  twice a day as needed for pain/muscle spasms in the back.  Try not to take Robaxin  and oxycodone  at the same time as both can make you drowsy.  May use over-the-counter lidocaine patch on the back for up to 12 hours at a time.  Get rest over the next few days to help back rest.  Follow-up with primary care doctor as scheduled next week for recheck.  Return to ED if any symptoms worsen including severe uncontrolled pain, chest pain, shortness of breath.

## 2024-04-13 NOTE — ED Provider Notes (Signed)
 " [facility] AT [facility] Provider Note   CSN: [id number] Arrival date & time: 04/13/24  1304     Patient presents with: Back Pain   Jennifer Bennett is a 79 y.o. female.  Patient is a 79 year old female with a history of hypertension, hyperlipidemia, CHF, and osteoarthritis who presents to the ED for increasing back pain with associated shortness of breath that began when she woke up this morning.  Patient notes she always has back pain but today was much worse than usual.  Pain is in the lower back and mainly on the left side.  She notes it will radiate to the abdomen occasionally but not on the legs.  She notes every time she moves, she feels short of breath and can no longer move.   Denies fall or injury.  She states she has had a cough for a few days and always feels a little short of breath but this is much worse than usual.  Notes it does not hurt to take a deep breath.  States she is on Lasix  20 mg every other day but no blood thinners.  Denies history of blood clots.  Denies fevers, chills, headache, dizziness, chest pain, nausea/vomit/diarrhea, urinary symptoms, incontinence, saddle anesthesias.  No further complaints.       Back Pain Associated symptoms: abdominal pain   Associated symptoms: no chest pain, no dysuria and no fever        Prior to Admission medications  Medication Sig Start Date End Date Taking? Authorizing Provider  methocarbamol  (ROBAXIN ) 500 MG tablet Take 1 tablet (500 mg total) by mouth 2 (two) times daily. 04/13/24  Yes Taray Normoyle, Thersia RAMAN, PA-C  naproxen  (NAPROSYN ) 500 MG tablet Take 1 tablet (500 mg total) by mouth 2 (two) times daily. 04/13/24  Yes Merrick Maggio, Thersia RAMAN, PA-C  oxyCODONE -acetaminophen  (PERCOCET/ROXICET) 5-325 MG tablet Take 1 tablet by mouth every 6 (six) hours as needed for up to 10 doses for severe pain (pain score 7-10). 04/13/24  Yes Sloane Palmer, Thersia RAMAN, PA-C  acetaminophen  (TYLENOL ) 650 MG CR tablet Take 650 mg by  mouth every 8 (eight) hours as needed for pain.    [provider]  allopurinol  (ZYLOPRIM ) 100 MG tablet Take 1 tablet (100 mg total) by mouth daily. 03/31/21   Landy Barnie RAMAN, NP  aspirin  EC 81 MG tablet Take 81 mg daily by mouth.    [provider]  atorvastatin (LIPITOR) 20 MG tablet Take 20 mg by mouth at bedtime. 10/11/23   [provider]  budesonide-glycopyrrolate-formoterol (BREZTRI AEROSPHERE) 160-9-4.8 MCG/ACT AERO inhaler Inhale 2 puffs into the lungs 2 (two) times daily.    [provider]  carvedilol  (COREG ) 6.25 MG tablet Take 1 tablet (6.25 mg total) by mouth 2 (two) times daily. 11/03/23   Mallipeddi, Vishnu P, MD  dapagliflozin  propanediol (FARXIGA ) 10 MG TABS tablet Take 1 tablet (10 mg total) by mouth daily before breakfast. 01/11/24   Mallipeddi, Vishnu P, MD  furosemide  (LASIX ) 40 MG tablet Take 1 tablet (40 mg total) by mouth daily. 08/25/22   Mallipeddi, Vishnu P, MD  gemfibrozil  (LOPID ) 600 MG tablet Take 1 tablet (600 mg total) by mouth daily. 03/31/21   Landy Barnie RAMAN, NP  meloxicam  (MOBIC ) 7.5 MG tablet Take one every other day 04/11/23   Margrette Taft BRAVO, MD  polyethylene glycol (MIRALAX  / GLYCOLAX ) 17 g packet Take 17 g by mouth daily. Patient not taking: Reported on 02/28/2024 03/18/21   Margrette Taft  E, MD  potassium chloride  SA (KLOR-CON  M20) 20 MEQ tablet Take 1 tablet (20 mEq total) by mouth daily. 08/25/22 02/28/24  Mallipeddi, Vishnu P, MD  Tetrahyd-Glyc-Hypro-PEG-ZnSulf (VISINE TOTALITY MULTI-SYMPTOM OP) Apply 1 drop 3 (three) times daily as needed to eye (for dry/irritated eyes.).    [provider]    Allergies: Ace inhibitors and Angiotensin receptor blockers    Review of Systems  Constitutional:  Negative for fever.  HENT:  Positive for congestion.   Respiratory:  Positive for cough and shortness of breath.   Cardiovascular:  Negative for chest pain.  Gastrointestinal:  Positive for abdominal pain.  Negative for diarrhea, nausea and vomiting.  Genitourinary:  Positive for flank pain. Negative for dysuria.  Musculoskeletal:  Positive for back pain.  All other systems reviewed and are negative.   Updated Vital Signs BP (!) 146/53   Pulse 63   Temp 98.5 F (36.9 C) (Oral)   Resp 16   Ht 5' 1.5 (1.562 m)   Wt 106.1 kg   SpO2 98%   BMI 43.50 kg/m   Physical Exam Constitutional:      Appearance: She is obese.  HENT:     Head: Normocephalic and atraumatic.     Nose: Nose normal.     Mouth/Throat:     Mouth: Mucous membranes are moist.     Pharynx: Oropharynx is clear.  Cardiovascular:     Rate and Rhythm: Normal rate.  Pulmonary:     Effort: Pulmonary effort is normal.     Breath sounds: Normal breath sounds.  Abdominal:     General: Bowel sounds are normal.     Palpations: Abdomen is soft.     Tenderness: There is no abdominal tenderness.  Musculoskeletal:        General: Normal range of motion.     Comments: PT pulses 2+ bilaterally.  Forage of motion of bilateral lower extremities with equal strength.  Normal dorsiflexion/plantarflexion bilaterally.  Mild pain in the lower back with straight leg raise bilaterally  Skin:    General: Skin is warm and dry.  Neurological:     Mental Status: She is alert and oriented to person, place, and time.  Psychiatric:        Mood and Affect: Mood normal.        Behavior: Behavior normal.     (all labs ordered are listed, but only abnormal results are displayed) Labs Reviewed  CBC WITH DIFFERENTIAL/PLATELET - Abnormal; Notable for the following components:      Result Value   RBC 5.27 (*)    All other components within normal limits  URINALYSIS, ROUTINE W REFLEX MICROSCOPIC - Abnormal; Notable for the following components:   Glucose, UA >=500 (*)    Protein, ur 100 (*)    All other components within normal limits  COMPREHENSIVE METABOLIC PANEL WITH GFR  PRO BRAIN NATRIURETIC PEPTIDE  LIPASE, BLOOD  TROPONIN T, HIGH  SENSITIVITY    EKG: EKG Interpretation Date/Time:  Friday April 13 2024 13:26:18 EST Ventricular Rate:  64 PR Interval:  204 QRS Duration:  68 QT Interval:  400 QTC Calculation: 412 R Axis:   45  Text Interpretation: Normal sinus rhythm T wave abnormality, consider lateral ischemia Abnormal ECG When compared with ECG of 26-Oct-2023 15:33, No significant change was found Confirmed by Cleotilde Rogue ([id number]) on 04/13/2024 1:38:31 PM  Radiology: CT Angio Chest PE W and/or Wo Contrast Result Date: 04/13/2024 CLINICAL DATA:  Shortness of breath. Acute, non localized abdominal  and flank pain. Back pain. EXAM: CT ANGIOGRAPHY CHEST CT ABDOMEN AND PELVIS WITH CONTRAST TECHNIQUE: Multidetector CT imaging of the chest was performed using the standard protocol during bolus administration of intravenous contrast. Multiplanar CT image reconstructions and MIPs were obtained to evaluate the vascular anatomy. Multidetector CT imaging of the abdomen and pelvis was performed using the standard protocol during bolus administration of intravenous contrast. RADIATION DOSE REDUCTION: This exam was performed according to the departmental dose-optimization program which includes automated exposure control, adjustment of the mA and/or kV according to patient size and/or use of iterative reconstruction technique. CONTRAST:  OMNIPAQUE  IOHEXOL  350 MG/ML SOLN COMPARISON:  Low-dose lung screening chest CT dated 11/02/2023. FINDINGS: CTA CHEST FINDINGS Cardiovascular: Atheromatous calcifications, including the coronary arteries and aorta. Mildly enlarged heart. No pericardial effusion. Mediastinum/Nodes: Normally opacified pulmonary arteries with no pulmonary arterial filling defects seen. No enlarged mediastinal, hilar, or axillary lymph nodes. Thyroid  gland, trachea, and esophagus demonstrate no significant findings. Lungs/Pleura: Interval visualization of a 3 mm right upper lobe nodule on image number 43/11. The previously  demonstrated 7 mm right middle lobe nodule measures 8 mm on image number 68/11. The previously demonstrated 4 mm right lower lobe nodule is unchanged on image number 68/11. A previously demonstrated 6 mm right lower lobe nodule measures 7 mm on image number 76/11. Right lung scarring and minimal dependent atelectasis. No acute left lung abnormality. Musculoskeletal: Thoracic and lower cervical spine and bilateral shoulder degenerative changes. Review of the MIP images confirms the above findings. CT ABDOMEN and PELVIS FINDINGS Hepatobiliary: No focal liver abnormality is seen. No gallstones, gallbladder wall thickening, or biliary dilatation. Pancreas: Unremarkable. No pancreatic ductal dilatation or surrounding inflammatory changes. Spleen: Normal in size without focal abnormality. Adrenals/Urinary Tract: Normal-appearing adrenal glands. Simple appearing bilateral renal cysts, not needing imaging follow-up. Unremarkable ureters and urinary bladder. No calculi or hydronephrosis. Stomach/Bowel: Sigmoid colon diverticulosis without evidence of diverticulitis. Surgically absent appendix with an associated staple line. Vascular/Lymphatic: Atheromatous arterial calcifications without aneurysm. No enlarged lymph nodes. Reproductive: Status post hysterectomy. No adnexal masses. Other: No abdominal wall hernia or abnormality. No abdominopelvic ascites. Musculoskeletal: Bilateral hip prostheses with associated streak artifacts. Extensive lumbar spine degenerative changes and mild scoliosis. Interbody and pedicle screw and rod fusion at the L4-5 level. Additional lumbar spine findings will be reported in the separate lumbar spine CT report. Review of the MIP images confirms the above findings. IMPRESSION: 1. No acute abnormality in the chest, abdomen or pelvis. 2. No pulmonary emboli. 3. Multiple right lung nodules, the largest measuring 8 mm in the right middle lobe. Recommend continued high-risk low-dose CT screening as  previously recommended. 4. Calcific coronary artery and aortic atherosclerosis. 5. Sigmoid colon diverticulosis. Aortic Atherosclerosis (ICD10-I70.0). Electronically Signed   By: Elspeth Bathe M.D.   On: 04/13/2024 17:36   CT ABDOMEN PELVIS W CONTRAST Result Date: 04/13/2024 CLINICAL DATA:  Shortness of breath. Acute, non localized abdominal and flank pain. Back pain. EXAM: CT ANGIOGRAPHY CHEST CT ABDOMEN AND PELVIS WITH CONTRAST TECHNIQUE: Multidetector CT imaging of the chest was performed using the standard protocol during bolus administration of intravenous contrast. Multiplanar CT image reconstructions and MIPs were obtained to evaluate the vascular anatomy. Multidetector CT imaging of the abdomen and pelvis was performed using the standard protocol during bolus administration of intravenous contrast. RADIATION DOSE REDUCTION: This exam was performed according to the departmental dose-optimization program which includes automated exposure control, adjustment of the mA and/or kV according to patient size and/or use of  iterative reconstruction technique. CONTRAST:  OMNIPAQUE  IOHEXOL  350 MG/ML SOLN COMPARISON:  Low-dose lung screening chest CT dated 11/02/2023. FINDINGS: CTA CHEST FINDINGS Cardiovascular: Atheromatous calcifications, including the coronary arteries and aorta. Mildly enlarged heart. No pericardial effusion. Mediastinum/Nodes: Normally opacified pulmonary arteries with no pulmonary arterial filling defects seen. No enlarged mediastinal, hilar, or axillary lymph nodes. Thyroid  gland, trachea, and esophagus demonstrate no significant findings. Lungs/Pleura: Interval visualization of a 3 mm right upper lobe nodule on image number 43/11. The previously demonstrated 7 mm right middle lobe nodule measures 8 mm on image number 68/11. The previously demonstrated 4 mm right lower lobe nodule is unchanged on image number 68/11. A previously demonstrated 6 mm right lower lobe nodule measures 7 mm on  image number 76/11. Right lung scarring and minimal dependent atelectasis. No acute left lung abnormality. Musculoskeletal: Thoracic and lower cervical spine and bilateral shoulder degenerative changes. Review of the MIP images confirms the above findings. CT ABDOMEN and PELVIS FINDINGS Hepatobiliary: No focal liver abnormality is seen. No gallstones, gallbladder wall thickening, or biliary dilatation. Pancreas: Unremarkable. No pancreatic ductal dilatation or surrounding inflammatory changes. Spleen: Normal in size without focal abnormality. Adrenals/Urinary Tract: Normal-appearing adrenal glands. Simple appearing bilateral renal cysts, not needing imaging follow-up. Unremarkable ureters and urinary bladder. No calculi or hydronephrosis. Stomach/Bowel: Sigmoid colon diverticulosis without evidence of diverticulitis. Surgically absent appendix with an associated staple line. Vascular/Lymphatic: Atheromatous arterial calcifications without aneurysm. No enlarged lymph nodes. Reproductive: Status post hysterectomy. No adnexal masses. Other: No abdominal wall hernia or abnormality. No abdominopelvic ascites. Musculoskeletal: Bilateral hip prostheses with associated streak artifacts. Extensive lumbar spine degenerative changes and mild scoliosis. Interbody and pedicle screw and rod fusion at the L4-5 level. Additional lumbar spine findings will be reported in the separate lumbar spine CT report. Review of the MIP images confirms the above findings. IMPRESSION: 1. No acute abnormality in the chest, abdomen or pelvis. 2. No pulmonary emboli. 3. Multiple right lung nodules, the largest measuring 8 mm in the right middle lobe. Recommend continued high-risk low-dose CT screening as previously recommended. 4. Calcific coronary artery and aortic atherosclerosis. 5. Sigmoid colon diverticulosis. Aortic Atherosclerosis (ICD10-I70.0). Electronically Signed   By: Elspeth Bathe M.D.   On: 04/13/2024 17:36   CT L-SPINE NO  CHARGE Result Date: 04/13/2024 EXAM: CT OF THE LUMBAR SPINE WITHOUT CONTRAST 04/13/2024 04:14:25 PM TECHNIQUE: CT of the lumbar spine was performed using multiplanar reconstructions from CT abdomen and pelvis obtained with contrast material. No additional contrast material was administered. Multiplanar reformatted images are provided for review. Automated exposure control, iterative reconstruction, and/or weight based adjustment of the mA/kV was utilized to reduce the radiation dose to as low as reasonably achievable. COMPARISON: None available. CLINICAL HISTORY: Back pain with previous history of back surgery. FINDINGS: BONES AND ALIGNMENT: Normal vertebral body heights. Postoperative changes with posterior rod and screw fixation and intervertebral fusion at L4-L5. Fused segments appear intact. Slight anterior subluxation of L2 on L3 is likely physiologic. No vertebral compression deformities. No acute fracture or suspicious bone lesion. No focal bone lesion or bone destruction. Bone cortex appears intact. Incidental note of bilateral hip arthroplasties. DEGENERATIVE CHANGES: Degenerative changes throughout the visualized thoracic and lumbar spine with narrowed interspaces and endplate osteophyte formation. Degenerative disc disease at L1-L2, L2-L3, and L3-L4 levels as well as lower thoracic levels. SOFT TISSUES: Calcification of the aorta. Additional findings, see report of CT abdomen and pelvis. No acute abnormality. IMPRESSION: 1. Postoperative changes with posterior rod and screw fixation and  intervertebral fusion at L4-L5, with intact fused segments. 2. Degenerative disc disease at L1-L2, L2-L3, and L3-L4 levels as well as lower thoracic levels. Electronically signed by: Elsie Gravely MD 04/13/2024 05:19 PM EST RP Workstation: [id number]      Medications Ordered in the ED  lidocaine  (LIDODERM ) 5 % 1 patch (1 patch Transdermal Patch Applied 04/13/24 1758)  morphine  (PF) 4 MG/ML injection 4 mg (4 mg  Intravenous Given 04/13/24 1441)  ondansetron  (ZOFRAN ) injection 4 mg (4 mg Intravenous Given 04/13/24 1440)  iohexol  (OMNIPAQUE ) 350 MG/ML injection 100 mL (100 mLs Intravenous Contrast Given 04/13/24 1555)  ketorolac  (TORADOL ) 15 MG/ML injection 15 mg (15 mg Intravenous Given 04/13/24 1802)  methocarbamol  (ROBAXIN ) tablet 500 mg (500 mg Oral Given 04/13/24 1800)  oxyCODONE  (Oxy IR/ROXICODONE ) immediate release tablet 5 mg (5 mg Oral Given 04/13/24 1801)  acetaminophen  (TYLENOL ) tablet 1,000 mg (1,000 mg Oral Given 04/13/24 1801)  methylPREDNISolone  sodium succinate (SOLU-MEDROL ) 125 mg/2 mL injection 125 mg (125 mg Intravenous Given 04/13/24 1802)                                   Medical Decision Making Patient is a 79 year old female with a history of hypertension, hyperlipidemia, CHF, and osteoarthritis who presents to the ED for increasing back pain with associated shortness of breath that began when she woke up this morning.  Please see detailed HPI above.  On exam patient is alert and comfortable lying on her side in the bed.  Physical exam as noted above.  No neurologic deficits noted in the bilateral lower extremities.  Differential includes acute on chronic back pain, UTI, pyelonephritis, nephrolithiasis, sciatica, cauda equina, ACS.  Lab workup reassuring with normal troponins and BNP.  No abnormal kidney function or signs of infection noted on UA.  EKG shows sinus rhythm.  CT of the chest redemonstrates lung nodules but no acute abnormality.  CT abdomen pelvis shows chronic lumbar surgery and degenerative disc disease but no acute abnormalities.  Suspect patient's symptoms are secondary to lumbar strain.  Less concerns for cauda equina as no incontinence or saddle anesthesia was reported.  This shortness of breath is most likely secondary to pain.  It is observed that when patient gets spasms, she holds her breath and become hypoxic.  She was given Tylenol , Robaxin , oxycodone , Toradol , and lidocaine   patch and states pain is feeling better.  Vital signs have stabilized.  She was able to ambulate with assistance as well while in ED.  Stable for discharge home.  Prescribed Robaxin , naproxen , and a few Percocet.  Symptomatic care discussed.  Advised PCP follow-up as scheduled next week for reevaluation.  Return precautions provided.  Amount and/or Complexity of Data Reviewed Labs: ordered. Radiology: ordered.  Risk OTC drugs. Prescription drug management.      Final diagnoses:  Acute bilateral low back pain without sciatica    ED Discharge Orders          Ordered    methocarbamol  (ROBAXIN ) 500 MG tablet  2 times daily        04/13/24 1922    naproxen  (NAPROSYN ) 500 MG tablet  2 times daily        04/13/24 1922    oxyCODONE -acetaminophen  (PERCOCET/ROXICET) 5-325 MG tablet  Every 6 hours PRN        04/13/24 1922               Neysa Thersia RAMAN, PA-C  04/13/24 2302    Cleotilde Rogue, MD 04/14/24 [phone]  "

## 2024-04-13 NOTE — ED Notes (Signed)
 Jennifer Bennett placed on pt after O2 drop to 86-87% after morphine  4mg  given. With Oxygen , pt return to 97% O2 saturation.

## 2024-04-13 NOTE — ED Triage Notes (Signed)
 Pt with c/o back pain, hx of back surgery years ago per pt. Soreness with certain movements and feels short of breath, pt is recovering from some sort of virus that is going around. Denies fevers.

## 2024-04-26 NOTE — Telephone Encounter (Signed)
 Jennifer Bennett

## 2024-05-02 ENCOUNTER — Ambulatory Visit: Admitting: Internal Medicine

## 2024-05-10 ENCOUNTER — Ambulatory Visit (HOSPITAL_COMMUNITY)

## 2024-06-06 ENCOUNTER — Ambulatory Visit (HOSPITAL_COMMUNITY)

## 2025-04-11 ENCOUNTER — Ambulatory Visit: Admitting: Orthopedic Surgery
